# Patient Record
Sex: Female | Born: 1965
Health system: Southern US, Community
[De-identification: ages and names within clinical notes are randomized; demographics above are authoritative.]

## PROBLEM LIST (undated history)

## (undated) DIAGNOSIS — F1910 Other psychoactive substance abuse, uncomplicated: Secondary | ICD-10-CM

## (undated) DIAGNOSIS — M79671 Pain in right foot: Secondary | ICD-10-CM

## (undated) DIAGNOSIS — F1010 Alcohol abuse, uncomplicated: Secondary | ICD-10-CM

## (undated) DIAGNOSIS — E119 Type 2 diabetes mellitus without complications: Secondary | ICD-10-CM

## (undated) DIAGNOSIS — M79672 Pain in left foot: Secondary | ICD-10-CM

## (undated) DIAGNOSIS — N611 Abscess of the breast and nipple: Secondary | ICD-10-CM

## (undated) DIAGNOSIS — F329 Major depressive disorder, single episode, unspecified: Secondary | ICD-10-CM

## (undated) DIAGNOSIS — J069 Acute upper respiratory infection, unspecified: Secondary | ICD-10-CM

## (undated) DIAGNOSIS — I10 Essential (primary) hypertension: Secondary | ICD-10-CM

## (undated) DIAGNOSIS — E7800 Pure hypercholesterolemia, unspecified: Secondary | ICD-10-CM

## (undated) DIAGNOSIS — F32A Depression, unspecified: Secondary | ICD-10-CM

## (undated) DIAGNOSIS — A549 Gonococcal infection, unspecified: Secondary | ICD-10-CM

## (undated) DIAGNOSIS — D649 Anemia, unspecified: Secondary | ICD-10-CM

## (undated) DIAGNOSIS — A159 Respiratory tuberculosis unspecified: Secondary | ICD-10-CM

## (undated) DIAGNOSIS — R569 Unspecified convulsions: Secondary | ICD-10-CM

## (undated) DIAGNOSIS — F419 Anxiety disorder, unspecified: Secondary | ICD-10-CM

## (undated) DIAGNOSIS — A749 Chlamydial infection, unspecified: Secondary | ICD-10-CM

## (undated) HISTORY — DX: Abscess of the breast and nipple: N61.1

## (undated) HISTORY — DX: Alcohol abuse, uncomplicated: F10.10

## (undated) HISTORY — DX: Gonococcal infection, unspecified: A54.9

## (undated) HISTORY — DX: Other psychoactive substance abuse, uncomplicated: F19.10

## (undated) HISTORY — PX: BREAST LUMPECTOMY: SHX2

## (undated) HISTORY — DX: Respiratory tuberculosis unspecified: A15.9

## (undated) HISTORY — DX: Chlamydial infection, unspecified: A74.9

---

## 2000-07-25 ENCOUNTER — Inpatient Hospital Stay (HOSPITAL_COMMUNITY): Admission: AC | Admit: 2000-07-25 | Discharge: 2000-07-27 | Payer: Self-pay

## 2000-07-25 ENCOUNTER — Encounter: Payer: Self-pay | Admitting: Orthopedic Surgery

## 2000-07-25 ENCOUNTER — Encounter: Payer: Self-pay | Admitting: *Deleted

## 2000-12-03 ENCOUNTER — Inpatient Hospital Stay (HOSPITAL_COMMUNITY): Admission: AD | Admit: 2000-12-03 | Discharge: 2000-12-03 | Payer: Self-pay | Admitting: *Deleted

## 2002-05-20 ENCOUNTER — Inpatient Hospital Stay (HOSPITAL_COMMUNITY): Admission: AD | Admit: 2002-05-20 | Discharge: 2002-05-22 | Payer: Self-pay | Admitting: Family Medicine

## 2003-04-23 ENCOUNTER — Emergency Department (HOSPITAL_COMMUNITY): Admission: EM | Admit: 2003-04-23 | Discharge: 2003-04-23 | Payer: Self-pay | Admitting: Emergency Medicine

## 2003-09-21 ENCOUNTER — Ambulatory Visit (HOSPITAL_COMMUNITY): Admission: RE | Admit: 2003-09-21 | Discharge: 2003-09-21 | Payer: Self-pay | Admitting: Obstetrics and Gynecology

## 2004-02-15 ENCOUNTER — Inpatient Hospital Stay (HOSPITAL_COMMUNITY): Admission: AD | Admit: 2004-02-15 | Discharge: 2004-02-17 | Payer: Self-pay | Admitting: *Deleted

## 2004-02-15 ENCOUNTER — Ambulatory Visit: Payer: Self-pay | Admitting: Obstetrics and Gynecology

## 2004-02-20 ENCOUNTER — Inpatient Hospital Stay (HOSPITAL_COMMUNITY): Admission: AD | Admit: 2004-02-20 | Discharge: 2004-02-20 | Payer: Self-pay | Admitting: Obstetrics & Gynecology

## 2004-02-24 ENCOUNTER — Ambulatory Visit: Payer: Self-pay | Admitting: *Deleted

## 2004-02-29 ENCOUNTER — Inpatient Hospital Stay (HOSPITAL_COMMUNITY): Admission: AD | Admit: 2004-02-29 | Discharge: 2004-03-02 | Payer: Self-pay | Admitting: Obstetrics & Gynecology

## 2005-07-30 ENCOUNTER — Emergency Department (HOSPITAL_COMMUNITY): Admission: EM | Admit: 2005-07-30 | Discharge: 2005-07-30 | Payer: Self-pay | Admitting: Emergency Medicine

## 2005-09-13 ENCOUNTER — Ambulatory Visit: Payer: Self-pay | Admitting: Psychiatry

## 2005-09-14 ENCOUNTER — Inpatient Hospital Stay (HOSPITAL_COMMUNITY): Admission: RE | Admit: 2005-09-14 | Discharge: 2005-09-23 | Payer: Self-pay | Admitting: Psychiatry

## 2006-01-21 HISTORY — PX: TONGUE SURGERY: SHX810

## 2008-08-31 ENCOUNTER — Emergency Department (HOSPITAL_COMMUNITY): Admission: EM | Admit: 2008-08-31 | Discharge: 2008-08-31 | Payer: Self-pay | Admitting: Emergency Medicine

## 2008-08-31 ENCOUNTER — Inpatient Hospital Stay (HOSPITAL_COMMUNITY): Admission: RE | Admit: 2008-08-31 | Discharge: 2008-09-06 | Payer: Self-pay | Admitting: Psychiatry

## 2008-08-31 ENCOUNTER — Ambulatory Visit: Payer: Self-pay | Admitting: Psychiatry

## 2009-03-20 ENCOUNTER — Ambulatory Visit: Payer: Self-pay | Admitting: Radiology

## 2009-03-20 ENCOUNTER — Emergency Department (HOSPITAL_BASED_OUTPATIENT_CLINIC_OR_DEPARTMENT_OTHER): Admission: EM | Admit: 2009-03-20 | Discharge: 2009-03-20 | Payer: Self-pay | Admitting: Emergency Medicine

## 2010-04-28 LAB — BASIC METABOLIC PANEL
Calcium: 8.6 mg/dL (ref 8.4–10.5)
GFR calc Af Amer: >60 mL/min (ref >60)
GFR calc non Af Amer: >60 mL/min (ref >60)
Potassium: 4.2 mEq/L (ref 3.5–5.1)
Sodium: 134 mEq/L — ABNORMAL LOW (ref 135–145)

## 2010-04-28 LAB — DIFFERENTIAL
Basophils Absolute: 0 10*3/uL (ref 0.0–0.1)
Basophils Relative: 1 % (ref 0–1)
Eosinophils Absolute: 0.1 10*3/uL (ref 0.0–0.7)
Lymphocytes Relative: 32 % (ref 12–46)
Monocytes Absolute: 0.3 10*3/uL (ref 0.1–1.0)
Monocytes Relative: 4 % (ref 3–12)

## 2010-04-28 LAB — CBC
MCHC: 33.8 g/dL (ref 30.0–36.0)
MCV: 84.6 fL (ref 78.0–100.0)
Platelets: 391 10*3/uL (ref 150–400)
WBC: 7.9 10*3/uL (ref 4.0–10.5)

## 2010-04-28 LAB — RAPID URINE DRUG SCREEN, HOSP PERFORMED
Amphetamines: NOT DETECTED
Benzodiazepines: NOT DETECTED
Opiates: NOT DETECTED

## 2010-04-28 LAB — ETHANOL: Alcohol, Ethyl (B): 152 mg/dL — ABNORMAL HIGH (ref 0–10)

## 2010-04-29 LAB — HEPATIC FUNCTION PANEL
ALT: 20 U/L (ref 0–35)
AST: 25 U/L (ref 0–37)
Albumin: 3.7 g/dL (ref 3.5–5.2)
Total Bilirubin: 0.5 mg/dL (ref 0.3–1.2)

## 2010-06-05 NOTE — H&P (Signed)
[name], [name]              ACCOUNT NO.:  [id number]   MEDICAL RECORD NO.:  [id number]          PATIENT TYPE:  IPS   LOCATION:  0504                          FACILITY:  [facility]   PHYSICIAN:  Geoffery Lyons, M.D.      DATE OF BIRTH:  Nov 28, 1965   DATE OF ADMISSION:  08/31/2008  DATE OF DISCHARGE:                       PSYCHIATRIC ADMISSION ASSESSMENT   This is a 45 year old female voluntarily admitted on August 31, 2008.   HISTORY OF PRESENT ILLNESS:  The patient relapsed on alcohol after being  sober for 1 year.  Attributes her relapse to moving back to the  [place] area to have a relationship which she states did not work out  well.  She reports while she was sober she was going to [employer] and  was in counseling and was going to school.  She has been feeling some  depressive symptoms, but denies any suicidal thoughts.  Her last drink  was the day of admission.  Denies any seizures.   PAST PSYCHIATRIC HISTORY:  First admission to [facility].  Has been in counseling before for about 8 months.  She also was  receiving vocational skills in [place] and was doing some on-the-  job training but currently has no outpatient mental health treatment.   SOCIAL HISTORY:  The patient was staying in the shelter for women.  She  has two children, ages 77 and 27 who are with the father of these children  in [place], [place].  She feels that she will have difficulty  obtaining custody again of these children due to her circumstances with  finances and lack of occupation.   FAMILY HISTORY:  None.   ALCOHOL AND DRUG HISTORY:  The patient has been drinking alcohol.  Denies any other substance use.   PRIMARY CARE Diannah Rindfleisch:  None.   MEDICAL PROBLEMS:  Denies any acute or chronic health issues.   MEDICATIONS LISTED:  Seroquel and Paxil, last taken approximately 2  months ago.   DRUG ALLERGIES:  No known allergies.   PHYSICAL EXAMINATION:  GENERAL:  This is a  middle-aged female fully  assessed at [facility] emergency department.  She appears in no  distress.  She offers no complaints.  Does appear tired, however.  Her  laboratory data shows an alcohol level of 152.  Urine drug screen is  negative.  Her BMET is within normal limits.  CBC shows a red blood cell  count of 5.26.  MENTAL STATUS EXAM:  The patient is fully alert and cooperative.  Provides a good history to her circumstances that surrounded this  admission.  Her speech is clear.  Normal pace and tone.  She has good  eye contact.  Her mood is depressed.  The patient does appear sad.  Thought processes are coherent, goal directed.  No delusional  statements.  Denies any suicidal thoughts.  Cognitive function intact.  Her memory is intact.  Her insight is good.  Again, she appears sincere.   IMPRESSION:  AXIS I:  Alcohol abuse.  Depressive disorder NOS.  AXIS II:  Deferred.  AXIS III:  No known  medical conditions.  AXIS IV:  Problems with occupation, possible problems with housing,  economic issues and psychosocial problems.  AXIS V:  Current is 45.   PLAN:  Our plan is have Librium available on a p.r.n. basis.  The  patient will be in the red group.  Will continue to work on her relapse  prevention and assess comorbidities, identify her support group.  Case  manager will assess her living arrangements.  Her tentative length of  stay at this time is 3-4 days.      Landry Corporal, N.P.      Geoffery Lyons, M.D.  Electronically Signed    JO/MEDQ  D:  09/01/2008  T:  09/01/2008  Job:  [id number]
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
## 2010-11-06 ENCOUNTER — Emergency Department (HOSPITAL_COMMUNITY): Payer: Self-pay

## 2010-11-06 ENCOUNTER — Emergency Department (HOSPITAL_COMMUNITY)
Admission: EM | Admit: 2010-11-06 | Discharge: 2010-11-06 | Disposition: A | Discharge status: Home / Self Care | Payer: Self-pay | Attending: Emergency Medicine | Admitting: Emergency Medicine

## 2010-11-06 DIAGNOSIS — R6883 Chills (without fever): Secondary | ICD-10-CM | POA: Insufficient documentation

## 2010-11-06 DIAGNOSIS — F17200 Nicotine dependence, unspecified, uncomplicated: Secondary | ICD-10-CM | POA: Insufficient documentation

## 2010-11-06 DIAGNOSIS — F319 Bipolar disorder, unspecified: Secondary | ICD-10-CM | POA: Insufficient documentation

## 2010-11-06 DIAGNOSIS — J069 Acute upper respiratory infection, unspecified: Secondary | ICD-10-CM | POA: Insufficient documentation

## 2010-11-06 DIAGNOSIS — R070 Pain in throat: Secondary | ICD-10-CM | POA: Insufficient documentation

## 2010-11-06 DIAGNOSIS — Z9889 Other specified postprocedural states: Secondary | ICD-10-CM | POA: Insufficient documentation

## 2010-11-06 DIAGNOSIS — K219 Gastro-esophageal reflux disease without esophagitis: Secondary | ICD-10-CM | POA: Insufficient documentation

## 2010-11-06 DIAGNOSIS — R059 Cough, unspecified: Secondary | ICD-10-CM | POA: Insufficient documentation

## 2010-11-06 DIAGNOSIS — IMO0001 Reserved for inherently not codable concepts without codable children: Secondary | ICD-10-CM | POA: Insufficient documentation

## 2010-11-06 DIAGNOSIS — R05 Cough: Secondary | ICD-10-CM | POA: Insufficient documentation

## 2010-11-06 DIAGNOSIS — J3489 Other specified disorders of nose and nasal sinuses: Secondary | ICD-10-CM | POA: Insufficient documentation

## 2010-11-08 ENCOUNTER — Other Ambulatory Visit: Payer: Self-pay | Admitting: Obstetrics and Gynecology

## 2010-11-08 DIAGNOSIS — Z1231 Encounter for screening mammogram for malignant neoplasm of breast: Secondary | ICD-10-CM

## 2010-11-13 ENCOUNTER — Ambulatory Visit
Admission: RE | Admit: 2010-11-13 | Discharge: 2010-11-13 | Disposition: A | Discharge status: Home / Self Care | Payer: No Typology Code available for payment source | Source: Ambulatory Visit | Attending: Obstetrics and Gynecology | Admitting: Obstetrics and Gynecology

## 2010-11-13 ENCOUNTER — Other Ambulatory Visit: Payer: Self-pay | Admitting: Obstetrics and Gynecology

## 2010-11-13 ENCOUNTER — Encounter (HOSPITAL_COMMUNITY): Payer: Self-pay

## 2010-11-13 ENCOUNTER — Ambulatory Visit (INDEPENDENT_AMBULATORY_CARE_PROVIDER_SITE_OTHER): Payer: No Typology Code available for payment source | Admitting: *Deleted

## 2010-11-13 ENCOUNTER — Encounter: Payer: Self-pay | Admitting: *Deleted

## 2010-11-13 ENCOUNTER — Other Ambulatory Visit: Payer: Self-pay | Admitting: Diagnostic Radiology

## 2010-11-13 ENCOUNTER — Ambulatory Visit (HOSPITAL_COMMUNITY): Admission: RE | Admit: 2010-11-13 | Payer: Self-pay | Source: Ambulatory Visit

## 2010-11-13 VITALS — BP 158/102 | HR 96 | Temp 97.8°F | Ht 63.25 in | Wt 199.2 lb

## 2010-11-13 DIAGNOSIS — N63 Unspecified lump in unspecified breast: Secondary | ICD-10-CM

## 2010-11-13 DIAGNOSIS — N6320 Unspecified lump in the left breast, unspecified quadrant: Secondary | ICD-10-CM

## 2010-11-13 DIAGNOSIS — Z01419 Encounter for gynecological examination (general) (routine) without abnormal findings: Secondary | ICD-10-CM

## 2010-11-13 HISTORY — PX: BREAST BIOPSY: SHX20

## 2010-11-13 NOTE — Progress Notes (Signed)
Lump in left breast.  Pap Smear:    Pap smear completed today. Last pap smear was greater than 3 years ago per patient. No history of abnormal pap smears per patient. No pap smear reports in EPIC.  Physical exam: Breasts Breasts symmetrical. No skin abnormality. Bilateral nipple inversion. Per patient nipples have always been inverted. No nipple discharge observed on exam. No lymphadenopathy. No lumps palpated in right breast. Left breast palpated a lump around 8cm in length x 5cm in width. In the lower beginning the center of the areola between 3-7 o'clock. Patient referred to the [facility] for Diagnostic mammogram and ultrasound and she is scheduled to go after exam completed.     Pelvic/Bimanual   Ext Genitalia Small red lesion observed on the right labia minora consistent with an inflamed sebaceous gland confirmed by Dr. Macon Large. No swelling or discharge observed.     Vagina Vagina pink. Vagina normal texture and no lesions observed. Small amount of thick white discharge observed.    Cervix Cervix present and pink. Cervix friable and small amount of bleeding at os.    Uterus Uterus present.       Adnexae Ovaries Present   Rectovaginal      No rectal exam completed related to no rectal complaints. No skin abnormalites observed.

## 2010-11-13 NOTE — Patient Instructions (Signed)
Taught patient how to perform BSE and gave her education materials to take home. Referred patient to the [facility] for Diagnostic mammogram, ultrasound and possible biopsy. Breast Center has scheduled for patient to come after she is seen at [facility] clinic. Gave patient directions. Informed patient that per BCCCP guidelines would not need another pap smear for 3 years unless pap smear abnormal or has had an abnormal pap smear in the past. Told patient would call her with results. Patient verbalized understanding.

## 2010-11-14 ENCOUNTER — Other Ambulatory Visit: Payer: Self-pay | Admitting: Obstetrics and Gynecology

## 2010-11-14 ENCOUNTER — Ambulatory Visit
Admission: RE | Admit: 2010-11-14 | Discharge: 2010-11-14 | Disposition: A | Discharge status: Home / Self Care | Payer: No Typology Code available for payment source | Source: Ambulatory Visit | Attending: Obstetrics and Gynecology | Admitting: Obstetrics and Gynecology

## 2010-11-14 DIAGNOSIS — N63 Unspecified lump in unspecified breast: Secondary | ICD-10-CM

## 2010-11-28 ENCOUNTER — Other Ambulatory Visit: Payer: Self-pay | Admitting: Obstetrics and Gynecology

## 2010-11-28 ENCOUNTER — Ambulatory Visit
Admission: RE | Admit: 2010-11-28 | Discharge: 2010-11-28 | Disposition: A | Discharge status: Home / Self Care | Payer: No Typology Code available for payment source | Source: Ambulatory Visit | Attending: Obstetrics and Gynecology | Admitting: Obstetrics and Gynecology

## 2010-11-28 DIAGNOSIS — N63 Unspecified lump in unspecified breast: Secondary | ICD-10-CM

## 2010-11-30 ENCOUNTER — Telehealth: Payer: Self-pay

## 2010-11-30 NOTE — Telephone Encounter (Signed)
Called pt and left message I'm calling on behalf of the [facility] program if she could give [place] a call at the clinics.  Pt needs to be informed of normal pap results and to make sure she gets a yearly pap with a pelvic exam next year.

## 2010-12-10 ENCOUNTER — Ambulatory Visit
Admission: RE | Admit: 2010-12-10 | Discharge: 2010-12-10 | Disposition: A | Discharge status: Home / Self Care | Payer: No Typology Code available for payment source | Source: Ambulatory Visit | Attending: Obstetrics and Gynecology | Admitting: Obstetrics and Gynecology

## 2010-12-10 DIAGNOSIS — N63 Unspecified lump in unspecified breast: Secondary | ICD-10-CM

## 2010-12-12 ENCOUNTER — Ambulatory Visit (INDEPENDENT_AMBULATORY_CARE_PROVIDER_SITE_OTHER): Payer: No Typology Code available for payment source | Admitting: General Surgery

## 2010-12-12 ENCOUNTER — Encounter (INDEPENDENT_AMBULATORY_CARE_PROVIDER_SITE_OTHER): Payer: Self-pay | Admitting: General Surgery

## 2010-12-12 VITALS — BP 140/100 | HR 72 | Temp 98.2°F | Resp 16 | Ht 64.0 in | Wt 198.8 lb

## 2010-12-12 DIAGNOSIS — N61 Mastitis without abscess: Secondary | ICD-10-CM

## 2010-12-12 DIAGNOSIS — N611 Abscess of the breast and nipple: Secondary | ICD-10-CM

## 2010-12-12 MED ORDER — DOXYCYCLINE HYCLATE 100 MG PO TABS: 100.0000 mg | ORAL_TABLET | Freq: Two times a day (BID) | ORAL | Status: AC

## 2010-12-12 NOTE — Progress Notes (Signed)
Subjective:     Patient ID: [name], female   DOB: 08/29/1965, 45 y.o.   MRN: [id number]  HPI The patient is a 45-year-old female who recently went to the breast center with left breast pain. She had a palpable mass in the lateral aspect of left breast. Ultrasound showed this area to be hypoechoic. Biopsies were negative. This was felt to be an abscess although it was never aspirated or cultured. She denies any fevers or chills. She does have some mild discomfort associated with it.  Review of Systems  Constitutional: Negative.   HENT: Negative.   Eyes: Negative.   Respiratory: Negative.   Cardiovascular: Negative.   Gastrointestinal: Negative.   Genitourinary: Negative.   Musculoskeletal: Negative.   Skin: Negative.   Neurological: Negative.   Hematological: Negative.   Psychiatric/Behavioral: Negative.        Objective:   Physical Exam  Constitutional: She is oriented to person, place, and time. She appears well-developed and well-nourished.  HENT:  Head: Normocephalic and atraumatic.  Eyes: Conjunctivae and EOM are normal. Pupils are equal, round, and reactive to light.  Neck: Normal range of motion. Neck supple.  Cardiovascular: Normal rate, regular rhythm and normal heart sounds.   Pulmonary/Chest: Effort normal and breath sounds normal.       The patient has about a 3 cm palpable mass in the lateral aspect of the left breast. No overlying cellulitis. No drainage from the nipple.  Abdominal: Soft. Bowel sounds are normal.  Musculoskeletal: Normal range of motion.  Neurological: She is alert and oriented to person, place, and time.  Skin: Skin is warm and dry.  Psychiatric: She has a normal mood and affect. Her behavior is normal.       Assessment:     Possible left breast abscess.    Plan:     If she truly has an abscess of the breast and I think it will need to be incised and drained. We would plan to leave it open and pack it with gauze and the gauze would  need to be changed daily. I've discussed this in detail with her including the risks and benefits of surgery as well as some of the technical aspects and she understands and wishes to proceed. We will go ahead and start her on doxycycline. We will go ahead and schedule her for an aspiration under anesthesia and possible incision and drainage.      

## 2010-12-26 ENCOUNTER — Inpatient Hospital Stay (HOSPITAL_COMMUNITY): Admission: RE | Admit: 2010-12-26 | Payer: No Typology Code available for payment source | Source: Ambulatory Visit

## 2010-12-27 ENCOUNTER — Encounter (HOSPITAL_COMMUNITY): Payer: Self-pay

## 2010-12-27 ENCOUNTER — Telehealth (INDEPENDENT_AMBULATORY_CARE_PROVIDER_SITE_OTHER): Payer: Self-pay

## 2010-12-27 ENCOUNTER — Other Ambulatory Visit (HOSPITAL_COMMUNITY): Payer: Self-pay | Admitting: *Deleted

## 2010-12-27 ENCOUNTER — Encounter (HOSPITAL_COMMUNITY)
Admission: RE | Admit: 2010-12-27 | Discharge: 2010-12-27 | Disposition: A | Discharge status: Home / Self Care | Payer: No Typology Code available for payment source | Source: Ambulatory Visit | Attending: General Surgery | Admitting: General Surgery

## 2010-12-27 HISTORY — DX: Depression, unspecified: F32.A

## 2010-12-27 HISTORY — DX: Anemia, unspecified: D64.9

## 2010-12-27 HISTORY — DX: Major depressive disorder, single episode, unspecified: F32.9

## 2010-12-27 HISTORY — DX: Acute upper respiratory infection, unspecified: J06.9

## 2010-12-27 LAB — HCG, SERUM, QUALITATIVE: Preg, Serum: NEGATIVE

## 2010-12-27 LAB — CBC
MCH: 29.6 pg (ref 26.0–34.0)
Platelets: 238 10*3/uL (ref 150–400)
RBC: 5.07 MIL/uL (ref 3.87–5.11)
WBC: 9.6 10*3/uL (ref 4.0–10.5)

## 2010-12-27 LAB — COMPREHENSIVE METABOLIC PANEL
ALT: 110 U/L — ABNORMAL HIGH (ref 0–35)
AST: 96 U/L — ABNORMAL HIGH (ref 0–37)
Albumin: 3.7 g/dL (ref 3.5–5.2)
Alkaline Phosphatase: 143 U/L — ABNORMAL HIGH (ref 39–117)
Glucose, Bld: 322 mg/dL — ABNORMAL HIGH (ref 70–99)
Potassium: 4.5 mEq/L (ref 3.5–5.1)
Sodium: 130 mEq/L — ABNORMAL LOW (ref 135–145)
Total Protein: 7.5 g/dL (ref 6.0–8.3)

## 2010-12-27 LAB — SURGICAL PCR SCREEN: MRSA, PCR: NEGATIVE

## 2010-12-27 NOTE — Pre-Procedure Instructions (Addendum)
[address] [name]  12/27/2010   Your procedure is scheduled on:  01/03/2011 @ 0830  Report to [facility] at 6:30 AM.  Call this number if you have problems the morning of surgery: [phone]   Remember: Discontinue Aspirin, Coumadin, Plavix, Effient and Herbal Medications.   Do not eat food:After Midnight.  Can  have clear liquids: up to 4 Hours before arrival.  Can have Clear liquids include soda, tea, black coffee, apple or grape juice, broth up until 2:30 a.m.  Take these medicines the morning of surgery with A SIP OF WATER: Doxycycline    Do not wear jewelry, make-up or nail polish.  Do not wear lotions, powders, or perfumes. You may wear deodorant.  Do not shave 48 hours prior to surgery.  Do not bring valuables to the hospital.  Contacts, dentures or bridgework may not be worn into surgery.  Leave suitcase in the car. After surgery it may be brought to your room.  For patients admitted to the hospital, checkout time is 11:00 AM the day of discharge.   Patients discharged the day of surgery will not be allowed to drive home.  Name and phone number of your driver: Leonard Hendler fiance.  Special Instructions: CHG Shower Use Special Wash: 1/2 bottle night before surgery and 1/2 bottle morning of surgery.   Please read over the following fact sheets that you were given: Pain Booklet, Coughing and Deep Breathing, MRSA Information and Surgical Site Infection Prevention

## 2010-12-27 NOTE — Telephone Encounter (Signed)
[name] at [employer] called re:abn glucose of 322 on pre op labs. Pt will need clearance [id number] per preop. I have tried to reach pt at phone # in system but had to leave msg. Pt does not have a PCP listed.  Labs printed and will give these labs to Robbin to continue to reach pt for work up prior to surgery. Will route msg to Dr Carolynne Edouard also.

## 2010-12-27 NOTE — Progress Notes (Signed)
Dr. Billey Chang office made aware of pt. Blood glucose 322,no history of diabetes.

## 2010-12-28 ENCOUNTER — Telehealth (INDEPENDENT_AMBULATORY_CARE_PROVIDER_SITE_OTHER): Payer: Self-pay

## 2010-12-28 NOTE — Telephone Encounter (Signed)
Left message for patient to call re: pre op glucose.  RMP

## 2010-12-28 NOTE — Consult Note (Signed)
Anesthesia:  Patient is a 45 year old female for I&D of a left breast abscess.  Her history includes depression, smoking, STDs, substance abuse, and TB s/p treatment.  Her BP was also elevated at her PAT appointment (154/94).  I was asked to review her preoperative labs showing an elevated glucose of 322.  Her AST and ALT were also elevated at 96 and 110, respectively.  This was new since August of 2010.  No PCP is listed.  Her last CXR on 11/06/10 showed no acute process.  She has no EKG in the [facility] system.  Her PAT nurse has already notified [facility] re: need for the patient to be evaluated for her hyperglycemia.  Since [facility] is now closed for the day, I will have to follow-up with them on 12/31/10.  I will also have to review her elevated LFTs with them at that time.

## 2010-12-31 NOTE — Consult Note (Signed)
Anesthesia:  See my consult note from 12/28/10.  I have spoken with Marcelino Duster at [facility].  Glenna Fellows from their office has already attempted to contact the patient, but has not heard back from her yet.  I also was unable to contact the patient at the number she provided.    I did review her labs and history with Dr. Gypsy Balsam.  Since the patient has a suspected breast abscess, it should be drained.  Ideally, her glucose should be controlled, but in the event that she can not be evaluated by a PCP preoperatively, her hyperglycemia will be treated on arrival as appropriate and peri-operatively.  Ideally, she should also be worked-up for her elevated AST/ALT pre-operatively, however, due to the nature of the procedure, Anesthesia feels this can be evaluated post-operatively.  Dr. Carolynne Edouard had started her on Doxycyline which could be contributing to her elevated transaminases, but without a formal evaluation, infectious or other etiology cannot be ruled out.  We will check a glucose on arrival, but otherwise Anesthesia will defer any additional preoperative lab orders to Dr. Carolynne Edouard.  We will order an EKG for the morning of surgery based on her history of probably untreated DM2 and HTN.

## 2011-01-02 MED ORDER — VANCOMYCIN HCL IN DEXTROSE 1-5 GM/200ML-% IV SOLN
1000.0000 mg | INTRAVENOUS | Status: AC
  Administered 2011-01-03: 1000 mg via INTRAVENOUS
  Filled 2011-01-02: qty 200

## 2011-01-02 NOTE — Telephone Encounter (Signed)
I HAVE LEFT A MESSAGE ON AVAILABLE PHONE # GIVEN X 2 DAYS. DR. Carolynne Edouard INDICATED HE WOULD PROCEED WITH SURGERY. ALLISON AT PRE-ADMITTING NOTIFIED./ [phone]/GY

## 2011-01-03 ENCOUNTER — Encounter (HOSPITAL_COMMUNITY): Payer: Self-pay | Admitting: Vascular Surgery

## 2011-01-03 ENCOUNTER — Other Ambulatory Visit (INDEPENDENT_AMBULATORY_CARE_PROVIDER_SITE_OTHER): Payer: Self-pay | Admitting: General Surgery

## 2011-01-03 ENCOUNTER — Ambulatory Visit (HOSPITAL_COMMUNITY): Payer: No Typology Code available for payment source | Admitting: Vascular Surgery

## 2011-01-03 ENCOUNTER — Other Ambulatory Visit: Payer: Self-pay

## 2011-01-03 ENCOUNTER — Ambulatory Visit (HOSPITAL_COMMUNITY)
Admission: RE | Admit: 2011-01-03 | Discharge: 2011-01-03 | Disposition: A | Discharge status: Home / Self Care | Payer: No Typology Code available for payment source | Source: Ambulatory Visit | Attending: General Surgery | Admitting: General Surgery

## 2011-01-03 ENCOUNTER — Encounter (HOSPITAL_COMMUNITY)
Admission: RE | Disposition: A | Discharge status: Home / Self Care | Payer: Self-pay | Source: Ambulatory Visit | Attending: General Surgery

## 2011-01-03 DIAGNOSIS — F329 Major depressive disorder, single episode, unspecified: Secondary | ICD-10-CM | POA: Insufficient documentation

## 2011-01-03 DIAGNOSIS — N611 Abscess of the breast and nipple: Secondary | ICD-10-CM

## 2011-01-03 DIAGNOSIS — N61 Mastitis without abscess: Secondary | ICD-10-CM

## 2011-01-03 DIAGNOSIS — F17200 Nicotine dependence, unspecified, uncomplicated: Secondary | ICD-10-CM | POA: Insufficient documentation

## 2011-01-03 DIAGNOSIS — F3289 Other specified depressive episodes: Secondary | ICD-10-CM | POA: Insufficient documentation

## 2011-01-03 HISTORY — PX: INCISE AND DRAIN ABCESS: PRO64

## 2011-01-03 LAB — GLUCOSE, CAPILLARY: Glucose-Capillary: 293 mg/dL — ABNORMAL HIGH (ref 70–99)

## 2011-01-03 SURGERY — INCISION AND DRAINAGE, ABSCESS
Anesthesia: General | Site: Breast | Laterality: Left | Wound class: Dirty or Infected

## 2011-01-03 MED ORDER — FENTANYL CITRATE 0.05 MG/ML IJ SOLN
INTRAMUSCULAR | Status: DC | PRN
  Administered 2011-01-03: 50 ug via INTRAVENOUS

## 2011-01-03 MED ORDER — PROPOFOL 10 MG/ML IV EMUL
INTRAVENOUS | Status: DC | PRN
  Administered 2011-01-03: 200 mg via INTRAVENOUS

## 2011-01-03 MED ORDER — HYDROMORPHONE HCL PF 1 MG/ML IJ SOLN
0.2500 mg | INTRAMUSCULAR | Status: DC | PRN
  Administered 2011-01-03: 0.5 mg via INTRAVENOUS

## 2011-01-03 MED ORDER — ONDANSETRON HCL 4 MG/2ML IJ SOLN
INTRAMUSCULAR | Status: DC | PRN
  Administered 2011-01-03: 4 mg via INTRAVENOUS

## 2011-01-03 MED ORDER — SODIUM CHLORIDE 0.9 % IR SOLN
Status: DC | PRN
  Administered 2011-01-03: 1

## 2011-01-03 MED ORDER — HYDROCODONE-ACETAMINOPHEN 5-325 MG PO TABS: 1.0000 | ORAL_TABLET | ORAL | Status: AC | PRN

## 2011-01-03 MED ORDER — LIDOCAINE HCL (CARDIAC) 20 MG/ML IV SOLN
INTRAVENOUS | Status: DC | PRN
  Administered 2011-01-03: 80 mg via INTRAVENOUS

## 2011-01-03 MED ORDER — LACTATED RINGERS IV SOLN
INTRAVENOUS | Status: DC | PRN
  Administered 2011-01-03: 09:00:00 via INTRAVENOUS

## 2011-01-03 MED ORDER — MIDAZOLAM HCL 5 MG/5ML IJ SOLN
INTRAMUSCULAR | Status: DC | PRN
Start: 2011-01-03 — End: 2011-01-03
  Administered 2011-01-03 (×2): 1 mg via INTRAVENOUS

## 2011-01-03 MED ORDER — ONDANSETRON HCL 4 MG/2ML IJ SOLN: 4.0000 mg | Freq: Four times a day (QID) | INTRAMUSCULAR | Status: DC | PRN

## 2011-01-03 MED ORDER — BUPIVACAINE-EPINEPHRINE 0.25% -1:200000 IJ SOLN
INTRAMUSCULAR | Status: DC | PRN
  Administered 2011-01-03: 30 mL

## 2011-01-03 SURGICAL SUPPLY — 41 items
BANDAGE GAUZE ELAST BULKY 4 IN (GAUZE/BANDAGES/DRESSINGS) IMPLANT
BLADE SURG 10 STRL SS (BLADE) ×2 IMPLANT
BLADE SURG 15 STRL LF DISP TIS (BLADE) ×1 IMPLANT
BLADE SURG 15 STRL SS (BLADE) ×2
CANISTER SUCTION 2500CC (MISCELLANEOUS) ×2 IMPLANT
CLOTH BEACON ORANGE TIMEOUT ST (SAFETY) ×2 IMPLANT
COVER SURGICAL LIGHT HANDLE (MISCELLANEOUS) ×2 IMPLANT
DECANTER SPIKE VIAL GLASS SM (MISCELLANEOUS) IMPLANT
DRAPE LAPAROSCOPIC ABDOMINAL (DRAPES) ×2 IMPLANT
DRAPE UTILITY 15X26 W/TAPE STR (DRAPE) ×2 IMPLANT
DRSG PAD ABDOMINAL 8X10 ST (GAUZE/BANDAGES/DRESSINGS) IMPLANT
ELECT CAUTERY BLADE 6.4 (BLADE) ×2 IMPLANT
ELECT REM PT RETURN 9FT ADLT (ELECTROSURGICAL) ×2
ELECTRODE REM PT RTRN 9FT ADLT (ELECTROSURGICAL) ×1 IMPLANT
GLOVE BIO SURGEON STRL SZ7.5 (GLOVE) ×4 IMPLANT
GOWN STRL NON-REIN LRG LVL3 (GOWN DISPOSABLE) ×6 IMPLANT
KIT BASIN OR (CUSTOM PROCEDURE TRAY) ×2 IMPLANT
KIT ROOM TURNOVER OR (KITS) ×2 IMPLANT
NDL HYPO 25X1 1.5 SAFETY (NEEDLE) IMPLANT
NEEDLE HYPO 25X1 1.5 SAFETY (NEEDLE) ×2 IMPLANT
NS IRRIG 1000ML POUR BTL (IV SOLUTION) ×2 IMPLANT
PACK SURGICAL SETUP 50X90 (CUSTOM PROCEDURE TRAY) ×2 IMPLANT
PAD ARMBOARD 7.5X6 YLW CONV (MISCELLANEOUS) ×4 IMPLANT
PENCIL BUTTON HOLSTER BLD 10FT (ELECTRODE) ×2 IMPLANT
SPECIMEN JAR SMALL (MISCELLANEOUS) IMPLANT
SPONGE GAUZE 2X2 12PLY UNSTER (GAUZE/BANDAGES/DRESSINGS) ×2 IMPLANT
SPONGE GAUZE 4X4 12PLY (GAUZE/BANDAGES/DRESSINGS) IMPLANT
SPONGE LAP 18X18 X RAY DECT (DISPOSABLE) ×2 IMPLANT
SUT MNCRL AB 4-0 PS2 18 (SUTURE) ×2 IMPLANT
SUT VIC AB 3-0 SH 27 (SUTURE) ×2
SUT VIC AB 3-0 SH 27XBRD (SUTURE) ×1 IMPLANT
SWAB COLLECTION DEVICE MRSA (MISCELLANEOUS) ×2 IMPLANT
SYR BULB 3OZ (MISCELLANEOUS) ×2 IMPLANT
SYR CONTROL 10ML LL (SYRINGE) ×2 IMPLANT
TAPE CLOTH SURG 6X10 WHT LF (GAUZE/BANDAGES/DRESSINGS) ×2 IMPLANT
TOWEL OR 17X24 6PK STRL BLUE (TOWEL DISPOSABLE) ×2 IMPLANT
TOWEL OR 17X26 10 PK STRL BLUE (TOWEL DISPOSABLE) ×2 IMPLANT
TUBE ANAEROBIC SPECIMEN COL (MISCELLANEOUS) ×2 IMPLANT
TUBE CONNECTING 12X1/4 (SUCTIONS) ×2 IMPLANT
WATER STERILE IRR 1000ML POUR (IV SOLUTION) IMPLANT
YANKAUER SUCT BULB TIP NO VENT (SUCTIONS) ×2 IMPLANT

## 2011-01-03 NOTE — Anesthesia Postprocedure Evaluation (Signed)
Anesthesia Post Note  Patient: [name]  Procedure(s) Performed:  INCISION AND DRAINAGE ABSCESS - incision and drainage of left breast  Anesthesia type: General  Patient location: PACU  Post pain: Pain level controlled and Adequate analgesia  Post assessment: Post-op Vital signs reviewed, Patient's Cardiovascular Status Stable, Respiratory Function Stable, Patent Airway and Pain level controlled  Last Vitals:  Filed Vitals:   01/03/11 1039  BP:   Pulse:   Temp: 36.9 C  Resp:     Post vital signs: Reviewed and stable  Level of consciousness: awake, alert  and oriented  Complications: No apparent anesthesia complications

## 2011-01-03 NOTE — Interval H&P Note (Signed)
History and Physical Interval Note:  01/03/2011 8:32 AM  [name]  has presented today for surgery, with the diagnosis of left breast abscess   The various methods of treatment have been discussed with the patient and family. After consideration of risks, benefits and other options for treatment, the patient has consented to  Procedure(s): INCISION AND DRAINAGE ABSCESS as a surgical intervention .  The patients' history has been reviewed, patient examined, no change in status, stable for surgery.  I have reviewed the patients' chart and labs.  Questions were answered to the patient's satisfaction.     TOTH III,Jade Burkard S

## 2011-01-03 NOTE — Progress Notes (Signed)
Dr. Chaney Malling made aware of CBG of 293. Pt. not aware abt. elevated  Blood sugar.  Denies Diabetes. Pt. Education given. Needs to see primary MD.

## 2011-01-03 NOTE — Op Note (Signed)
01/03/2011  9:39 AM  PATIENT:  [name]  45 y.o. female  PRE-OPERATIVE DIAGNOSIS:  left breast abscess   POST-OPERATIVE DIAGNOSIS:  * No post-op diagnosis entered *  PROCEDURE:  Procedure(s): INCISION AND DRAINAGE ABSCESS  SURGEON:  Surgeon(s): Caleen Essex III, MD  PHYSICIAN ASSISTANT:   ASSISTANTS: none   ANESTHESIA:   general  EBL:  Total I/O In: 400 [I.V.:400] Out: -   BLOOD ADMINISTERED:none  DRAINS: none   LOCAL MEDICATIONS USED:  NONE  SPECIMEN:  Biopsy / Limited Resection  DISPOSITION OF SPECIMEN:  PATHOLOGY  COUNTS:  YES  TOURNIQUET:  * No tourniquets in log *  DICTATION: .Dragon Dictation after informed consent was obtained the patient was brought to the operating room and placed in the supine position on the operative table. After adequate induction of general anesthesia the patient's left breast was prepped with ChloraPrep, allowed to dry, and draped in usual sterile fashion. The patient had a palpable mass in the 5:00 position of the left breast just at the edge of the areola. An attempt was made to pass an 18-gauge needle into this and aspirated but we were unable to aspirate any fluid. A very small radially oriented incision was made overlying the palpable mass with a 15 blade knife. The dissection was carried into the breast tissue by a combination of sharp Bovie dissection and blunt dissection with a hemostat. We were able to identify and enter the cavity. We never really aspirated any pus. Some of the firm cavity wall was excised sharply with the electrocautery and sent to pathology. Cultures were obtained. The wound was examined and found to be hemostatic. The wound was packed with a 4 x 4 gauze and sterile dressings were applied. The patient tolerated the procedure well. At the end of the case all needle sponge and instrument counts are correct. The patient was then awakened and taken to recovery in stable condition.  PLAN OF CARE: Discharge to  home after PACU  PATIENT DISPOSITION:  PACU - hemodynamically stable.   Delay start of Pharmacological VTE agent (>24hrs) due to surgical blood loss or risk of bleeding:  {YES/NO/NOT APPLICABLE:20182

## 2011-01-03 NOTE — Preoperative (Signed)
Beta Blockers   Reason not to administer Beta Blockers:Not Applicable 

## 2011-01-03 NOTE — Transfer of Care (Signed)
Immediate Anesthesia Transfer of Care Note  Patient: [name]  Procedure(s) Performed:  INCISION AND DRAINAGE ABSCESS - incision and drainage of left breast  Patient Location: PACU  Anesthesia Type: General  Level of Consciousness: awake, sedated and patient cooperative  Airway & Oxygen Therapy: Patient Spontanous Breathing and Patient connected to nasal cannula oxygen  Post-op Assessment: Report given to PACU RN, Post -op Vital signs reviewed and stable and Patient moving all extremities X 4  Post vital signs: Reviewed and stable  Complications: No apparent anesthesia complications

## 2011-01-03 NOTE — Anesthesia Procedure Notes (Signed)
Procedure Name: LMA Insertion Date/Time: 01/03/2011 9:13 AM Performed by: Malachi Pro Pre-anesthesia Checklist: Patient identified Patient Re-evaluated:Patient Re-evaluated prior to inductionOxygen Delivery Method: Circle System Utilized Intubation Type: Combination inhalational/ intravenous induction Ventilation: Mask ventilation without difficulty LMA: LMA inserted LMA Size: 4.0 Tube secured with: Tape Dental Injury: Teeth and Oropharynx as per pre-operative assessment

## 2011-01-03 NOTE — H&P (View-Only) (Signed)
Subjective:     Patient ID: [name], female   DOB: 04/03/1965, 45 y.o.   MRN: [id number]  HPI The patient is a 45 year old female who recently went to the breast center with left breast pain. She had a palpable mass in the lateral aspect of left breast. Ultrasound showed this area to be hypoechoic. Biopsies were negative. This was felt to be an abscess although it was never aspirated or cultured. She denies any fevers or chills. She does have some mild discomfort associated with it.  Review of Systems  Constitutional: Negative.   HENT: Negative.   Eyes: Negative.   Respiratory: Negative.   Cardiovascular: Negative.   Gastrointestinal: Negative.   Genitourinary: Negative.   Musculoskeletal: Negative.   Skin: Negative.   Neurological: Negative.   Hematological: Negative.   Psychiatric/Behavioral: Negative.        Objective:   Physical Exam  Constitutional: She is oriented to person, place, and time. She appears well-developed and well-nourished.  HENT:  Head: Normocephalic and atraumatic.  Eyes: Conjunctivae and EOM are normal. Pupils are equal, round, and reactive to light.  Neck: Normal range of motion. Neck supple.  Cardiovascular: Normal rate, regular rhythm and normal heart sounds.   Pulmonary/Chest: Effort normal and breath sounds normal.       The patient has about a 3 cm palpable mass in the lateral aspect of the left breast. No overlying cellulitis. No drainage from the nipple.  Abdominal: Soft. Bowel sounds are normal.  Musculoskeletal: Normal range of motion.  Neurological: She is alert and oriented to person, place, and time.  Skin: Skin is warm and dry.  Psychiatric: She has a normal mood and affect. Her behavior is normal.       Assessment:     Possible left breast abscess.    Plan:     If she truly has an abscess of the breast and I think it will need to be incised and drained. We would plan to leave it open and pack it with gauze and the gauze would  need to be changed daily. I've discussed this in detail with her including the risks and benefits of surgery as well as some of the technical aspects and she understands and wishes to proceed. We will go ahead and start her on doxycycline. We will go ahead and schedule her for an aspiration under anesthesia and possible incision and drainage.

## 2011-01-03 NOTE — Anesthesia Preprocedure Evaluation (Signed)
Anesthesia Evaluation  Patient identified by MRN, date of birth, ID band Patient awake    Reviewed: Allergy & Precautions, H&P , NPO status , Patient's Chart, lab work & pertinent test results  Airway Mallampati: II  Neck ROM: full    Dental   Pulmonary Recent URI , Current Smoker,          Cardiovascular     Neuro/Psych PSYCHIATRIC DISORDERS Depression    GI/Hepatic   Endo/Other    Renal/GU      Musculoskeletal   Abdominal   Peds  Hematology   Anesthesia Other Findings   Reproductive/Obstetrics                           Anesthesia Physical Anesthesia Plan  ASA: II  Anesthesia Plan: General   Post-op Pain Management:    Induction: Intravenous  Airway Management Planned: LMA  Additional Equipment:   Intra-op Plan:   Post-operative Plan:   Informed Consent: I have reviewed the patients History and Physical, chart, labs and discussed the procedure including the risks, benefits and alternatives for the proposed anesthesia with the patient or authorized representative who has indicated his/her understanding and acceptance.     Plan Discussed with: CRNA and Surgeon  Anesthesia Plan Comments:         Anesthesia Quick Evaluation

## 2011-01-03 NOTE — Progress Notes (Signed)
accucheck 273

## 2011-01-04 ENCOUNTER — Other Ambulatory Visit (INDEPENDENT_AMBULATORY_CARE_PROVIDER_SITE_OTHER): Payer: Self-pay

## 2011-01-04 DIAGNOSIS — Z5189 Encounter for other specified aftercare: Secondary | ICD-10-CM

## 2011-01-04 DIAGNOSIS — Z9889 Other specified postprocedural states: Secondary | ICD-10-CM

## 2011-01-06 LAB — CULTURE, ROUTINE-ABSCESS

## 2011-01-07 ENCOUNTER — Telehealth (INDEPENDENT_AMBULATORY_CARE_PROVIDER_SITE_OTHER): Payer: Self-pay | Admitting: General Surgery

## 2011-01-08 LAB — ANAEROBIC CULTURE

## 2011-01-21 ENCOUNTER — Telehealth: Payer: Self-pay | Admitting: *Deleted

## 2011-01-21 NOTE — Telephone Encounter (Signed)
Attempted to call patient to follow up. Patient needs to schedule a follow up appointment with Dr. Carolynne Edouard post surgery follow up. No one answered phone. Left voicemail for patient to call me back.

## 2011-01-24 NOTE — Telephone Encounter (Signed)
POST-OP VISIT MADE WITH DR. TOTH/GY

## 2011-01-30 ENCOUNTER — Encounter (INDEPENDENT_AMBULATORY_CARE_PROVIDER_SITE_OTHER): Payer: No Typology Code available for payment source | Admitting: General Surgery

## 2011-02-05 ENCOUNTER — Encounter (INDEPENDENT_AMBULATORY_CARE_PROVIDER_SITE_OTHER): Payer: No Typology Code available for payment source | Admitting: General Surgery

## 2011-02-13 ENCOUNTER — Encounter (INDEPENDENT_AMBULATORY_CARE_PROVIDER_SITE_OTHER): Payer: Self-pay | Admitting: General Surgery

## 2011-02-18 ENCOUNTER — Telehealth (INDEPENDENT_AMBULATORY_CARE_PROVIDER_SITE_OTHER): Payer: Self-pay | Admitting: Surgery

## 2011-02-18 NOTE — Telephone Encounter (Signed)
APPT MOVED UP TO FEB 18 WITH DR. TOTH/GY

## 2011-03-11 ENCOUNTER — Encounter (INDEPENDENT_AMBULATORY_CARE_PROVIDER_SITE_OTHER): Payer: No Typology Code available for payment source | Admitting: General Surgery

## 2011-03-26 ENCOUNTER — Encounter (INDEPENDENT_AMBULATORY_CARE_PROVIDER_SITE_OTHER): Payer: No Typology Code available for payment source | Admitting: General Surgery

## 2011-05-09 ENCOUNTER — Other Ambulatory Visit (INDEPENDENT_AMBULATORY_CARE_PROVIDER_SITE_OTHER): Payer: Self-pay | Admitting: General Surgery

## 2011-05-09 ENCOUNTER — Telehealth (INDEPENDENT_AMBULATORY_CARE_PROVIDER_SITE_OTHER): Payer: Self-pay | Admitting: General Surgery

## 2011-05-09 DIAGNOSIS — N644 Mastodynia: Secondary | ICD-10-CM

## 2011-05-09 NOTE — Telephone Encounter (Signed)
Pt calling about new pain that has developed in the Lt breast at the same site she has surgery in December.  Chart review stated that was a benign abscess that was drained.  Scheduled her for appt 06/05/11 at 9:10am, but she is in pain.  Can she be moved up to sooner appt.

## 2011-05-09 NOTE — Telephone Encounter (Signed)
[name] has a appt at the [facility] on 05-10-2011 for [facility] and [place] and pt is aware

## 2011-05-10 ENCOUNTER — Ambulatory Visit
Admission: RE | Admit: 2011-05-10 | Discharge: 2011-05-10 | Disposition: A | Discharge status: Home / Self Care | Payer: No Typology Code available for payment source | Source: Ambulatory Visit | Attending: General Surgery | Admitting: General Surgery

## 2011-05-10 DIAGNOSIS — N644 Mastodynia: Secondary | ICD-10-CM

## 2011-05-13 ENCOUNTER — Ambulatory Visit (INDEPENDENT_AMBULATORY_CARE_PROVIDER_SITE_OTHER): Payer: Self-pay | Admitting: General Surgery

## 2011-05-13 ENCOUNTER — Encounter (INDEPENDENT_AMBULATORY_CARE_PROVIDER_SITE_OTHER): Payer: Self-pay | Admitting: General Surgery

## 2011-05-13 ENCOUNTER — Other Ambulatory Visit (INDEPENDENT_AMBULATORY_CARE_PROVIDER_SITE_OTHER): Payer: Self-pay | Admitting: General Surgery

## 2011-05-13 VITALS — BP 164/98 | HR 76 | Temp 96.2°F | Ht 62.5 in | Wt 190.8 lb

## 2011-05-13 DIAGNOSIS — S21009A Unspecified open wound of unspecified breast, initial encounter: Secondary | ICD-10-CM

## 2011-05-13 NOTE — Progress Notes (Signed)
HPI The patient comes in with an infected wound status post biopsy. She recently been placed on doxycycline.  PE She has a 2-3 mm opening in the left periareolar area. The probe to about 3-4 cm. There is a pocket associated with it. It did not washed out with peroxide however I did take a culture. This is what the patient being on doxycycline.    Studiy review None.  Assessment Infected postoperative wound from biopsy. I told the patient to continue to do wet-to-dry dressings with the Nu Gauze. And saline.  Plan Wet-to-dry dressings with quarter-inch plain gauze soaked in saline. It should be done at least twice a day. Dr. Carolynne Edouard was in the clinic today came on his elbow to the patient. She will followup in his clinic in the future. She is to continue to take her doxycycline until her prescription is filled.

## 2011-05-14 ENCOUNTER — Encounter (INDEPENDENT_AMBULATORY_CARE_PROVIDER_SITE_OTHER): Payer: Self-pay | Admitting: General Surgery

## 2011-05-16 LAB — WOUND CULTURE: Gram Stain: NONE SEEN

## 2011-05-22 ENCOUNTER — Emergency Department (HOSPITAL_COMMUNITY)
Admission: EM | Admit: 2011-05-22 | Discharge: 2011-05-22 | Discharge status: Against Medical Advice | Payer: Self-pay | Attending: Emergency Medicine | Admitting: Emergency Medicine

## 2011-05-22 ENCOUNTER — Encounter (HOSPITAL_COMMUNITY): Payer: Self-pay

## 2011-05-22 DIAGNOSIS — R51 Headache: Secondary | ICD-10-CM | POA: Insufficient documentation

## 2011-05-22 DIAGNOSIS — F17200 Nicotine dependence, unspecified, uncomplicated: Secondary | ICD-10-CM | POA: Insufficient documentation

## 2011-05-22 DIAGNOSIS — Z79899 Other long term (current) drug therapy: Secondary | ICD-10-CM | POA: Insufficient documentation

## 2011-05-22 DIAGNOSIS — R209 Unspecified disturbances of skin sensation: Secondary | ICD-10-CM | POA: Insufficient documentation

## 2011-05-22 DIAGNOSIS — F329 Major depressive disorder, single episode, unspecified: Secondary | ICD-10-CM | POA: Insufficient documentation

## 2011-05-22 DIAGNOSIS — Z7982 Long term (current) use of aspirin: Secondary | ICD-10-CM | POA: Insufficient documentation

## 2011-05-22 DIAGNOSIS — F3289 Other specified depressive episodes: Secondary | ICD-10-CM | POA: Insufficient documentation

## 2011-05-22 NOTE — ED Provider Notes (Signed)
History     CSN: [id number]  Arrival date & time 05/22/11  1536   First MD Initiated Contact with Patient 05/22/11 1657      Chief Complaint  Patient presents with  . Headache    (Consider location/radiation/quality/duration/timing/severity/associated sxs/prior treatment) HPI Pt with lightheadedness this afternoon around 1300 and noticated L arm numbness that lasted approx 1 hour. No visual changes, weakness, N,V, HA, fever, neck pain. Pt is now symptom-free.  Past Medical History  Diagnosis Date  . Tuberculosis     was treated  . Gonorrhea   . Chlamydia   . Breast abscess   . Substance abuse     alcohol  . Recurrent upper respiratory infection (URI)     URI/SOB-treated with antibiotics-cleared up now  . Depression     PTSD also  . Anemia     when in late teens    Past Surgical History  Procedure Date  . Tongue surgery 2008    lump removed  . Incise and drain abcess 01/03/11    breast abscess    Family History  Problem Relation Age of Onset  . Cancer Mother     breast  . Cancer Sister     breast  . Cancer Paternal Aunt     breast    History  Substance Use Topics  . Smoking status: Current Everyday Smoker -- 0.2 packs/day for 30 years    Types: Cigarettes  . Smokeless tobacco: Never Used  . Alcohol Use: 0.0 oz/week    14-16 Cans of beer per week     quit "heavy drinking" 3 yrs ago- just recently started back about 6 months ago does not drink daily no-last time was about 3 days ago-3 cans of beer    OB History    Grav Para Term Preterm Abortions TAB SAB Ect Mult Living   2 2 2       2       Review of Systems  Constitutional: Negative for fever and chills.  HENT: Negative for neck pain and neck stiffness.   Eyes: Negative for photophobia and visual disturbance.  Respiratory: Negative for chest tightness and shortness of breath.   Cardiovascular: Negative for chest pain and leg swelling.  Gastrointestinal: Negative for nausea, vomiting, abdominal  pain and diarrhea.  Musculoskeletal: Negative for myalgias and back pain.  Skin: Negative for rash and wound.  Neurological: Positive for dizziness and light-headedness. Negative for syncope, weakness and headaches.  Psychiatric/Behavioral: Negative for confusion.    Allergies  Review of patient's allergies indicates no known allergies.  Home Medications   Current Outpatient Rx  Name Route Sig Dispense Refill  . ASPIRIN EC 81 MG PO TBEC Oral Take 162 mg by mouth once.    Marland Kitchen DOXYCYCLINE HYCLATE 100 MG PO TABS Oral Take 100 mg by mouth 2 (two) times daily.        BP 172/90  Pulse 70  Temp(Src) 98.2 F (36.8 C) (Oral)  Resp 18  SpO2 98%  LMP 05/03/2011  Physical Exam  Nursing note and vitals reviewed. Constitutional: She is oriented to person, place, and time. She appears well-developed and well-nourished. No distress.  HENT:  Head: Normocephalic and atraumatic.  Mouth/Throat: Oropharynx is clear and moist.  Eyes: EOM are normal. Pupils are equal, round, and reactive to light.  Neck: Normal range of motion. Neck supple.  Cardiovascular: Normal rate and regular rhythm.  Exam reveals no gallop and no friction rub.   No murmur heard. Pulmonary/Chest: Effort  normal and breath sounds normal. No respiratory distress. She has no wheezes. She has no rales.  Abdominal: Soft. Bowel sounds are normal. There is no tenderness. There is no rebound and no guarding.  Musculoskeletal: Normal range of motion. She exhibits no edema and no tenderness.  Neurological: She is alert and oriented to person, place, and time. No cranial nerve deficit.       5/5 motor in ext, sensation intact, CN II-XII, finger-to-nose intact.   Skin: Skin is warm and dry. No rash noted. No erythema.  Psychiatric: She has a normal mood and affect. Her behavior is normal.    ED Course  Procedures (including critical care time)  Labs Reviewed - No data to display No results found.   No diagnosis found.    MDM    Pt states she is feeling much better and has appointment to see PMD. She understands risks of leaving prior to completed workup and accepts responsibility. States she has to catch the bus and pick her children up. She urged to return immediately for worsening symptoms or any concerns. She signed AMA form       Loren Racer, MD 05/22/11 1801

## 2011-05-22 NOTE — ED Notes (Signed)
ABout 14:30 pt developed a mild headache and lt arm is numbness which has resolved.  Pt. Reports, "It feels like it is not there"  No other neuro deficits    Pt. Has a  Lt.. Breast lumpectomy on December 13th and then began having pain in her lt. Breast again about 1 month ago.  Pt. Has scheduled on May 15th to have the infection cleaned from her lt. Breast.

## 2011-05-22 NOTE — ED Notes (Signed)
Pt st's she has to go because her children will be home.  St's all  Symptoms have subsided.   AMA form signed after risks of leaving were explained to her.  Also encouraged pt to return for any problems, pt voices understanding.

## 2011-05-22 NOTE — ED Notes (Signed)
Pt st's she had a headache earlier today and also felt like left arm was tingling. Pt st's she took ASA and now headache and tingling in arm has subsided. St's she was afraid because she has been told that she has HTN but thinks it was related to stress.  Pt alert and oriented x's 3, skin warm and dry, color appropriate.

## 2011-05-23 ENCOUNTER — Ambulatory Visit (INDEPENDENT_AMBULATORY_CARE_PROVIDER_SITE_OTHER): Payer: PRIVATE HEALTH INSURANCE | Admitting: General Surgery

## 2011-05-23 ENCOUNTER — Encounter (INDEPENDENT_AMBULATORY_CARE_PROVIDER_SITE_OTHER): Payer: Self-pay | Admitting: General Surgery

## 2011-05-23 VITALS — BP 140/100 | HR 64 | Temp 97.8°F | Resp 18 | Ht 63.0 in | Wt 191.4 lb

## 2011-05-23 DIAGNOSIS — N61 Mastitis without abscess: Secondary | ICD-10-CM

## 2011-05-23 DIAGNOSIS — N611 Abscess of the breast and nipple: Secondary | ICD-10-CM

## 2011-05-23 NOTE — Progress Notes (Signed)
Subjective:     Patient ID: [name], female   DOB: 1965/05/27, 46 y.o.   MRN: [id number]  HPI Patient is a 46 year old female who recently had an incision and drainage of her left breast by Dr. Lindie Spruce. I have actually incised and drained the same area back in November and apparently she did not come back for any of the postop appointments. She denies any pain or discharge today  Review of Systems     Objective:   Physical Exam On exam her left breast incision has healed over at this point. There is no sign of infection.    Assessment:     Status post incision and drainage of left breast abscess    Plan:     At this point I will plan to keep her on the doxycycline for about another 10 days. We will plan to see her back in about 3 or 4 weeks to check the incision again. If the abscess comes back then she may need to have this incised and drained more adequately and the OR again.

## 2011-06-05 ENCOUNTER — Encounter (INDEPENDENT_AMBULATORY_CARE_PROVIDER_SITE_OTHER): Payer: No Typology Code available for payment source | Admitting: General Surgery

## 2011-06-19 ENCOUNTER — Encounter (INDEPENDENT_AMBULATORY_CARE_PROVIDER_SITE_OTHER): Payer: Self-pay | Admitting: General Surgery

## 2011-06-24 ENCOUNTER — Encounter (INDEPENDENT_AMBULATORY_CARE_PROVIDER_SITE_OTHER): Payer: PRIVATE HEALTH INSURANCE | Admitting: General Surgery

## 2011-08-08 ENCOUNTER — Ambulatory Visit (INDEPENDENT_AMBULATORY_CARE_PROVIDER_SITE_OTHER): Payer: PRIVATE HEALTH INSURANCE | Admitting: General Surgery

## 2011-08-08 ENCOUNTER — Encounter (INDEPENDENT_AMBULATORY_CARE_PROVIDER_SITE_OTHER): Payer: Self-pay | Admitting: General Surgery

## 2011-08-08 VITALS — BP 156/100 | HR 85 | Temp 97.6°F | Ht 63.5 in | Wt 182.2 lb

## 2011-08-08 DIAGNOSIS — N61 Mastitis without abscess: Secondary | ICD-10-CM

## 2011-08-08 DIAGNOSIS — N611 Abscess of the breast and nipple: Secondary | ICD-10-CM

## 2011-08-08 NOTE — Progress Notes (Signed)
Subjective:     Patient ID: [name], female   DOB: 1965-02-24, 46 y.o.   MRN: [id number]  HPI The patient is a 46 year old white female who's had recurrent abscesses in the left breast. It has been several weeks since we saw her last period she returns today for follow up appointment.  She has no complaints today. Her breast feels normal.  Review of Systems     Objective:   Physical Exam On exam the incision on her left breast has healed nicely. There is no sign of infection. There is no induration or redness.    Assessment:     History of left breast abscess.    Plan:     At this point she looks well. She will call associate has any further problems. Otherwise we will see her back on a p.r.n. basis.

## 2011-10-07 ENCOUNTER — Ambulatory Visit (INDEPENDENT_AMBULATORY_CARE_PROVIDER_SITE_OTHER): Payer: PRIVATE HEALTH INSURANCE | Admitting: General Surgery

## 2011-10-07 ENCOUNTER — Encounter (INDEPENDENT_AMBULATORY_CARE_PROVIDER_SITE_OTHER): Payer: Self-pay | Admitting: General Surgery

## 2011-10-07 VITALS — BP 138/90 | HR 84 | Temp 97.3°F | Resp 16 | Ht 63.0 in | Wt 179.8 lb

## 2011-10-07 DIAGNOSIS — N644 Mastodynia: Secondary | ICD-10-CM

## 2011-10-07 DIAGNOSIS — N611 Abscess of the breast and nipple: Secondary | ICD-10-CM

## 2011-10-07 NOTE — Progress Notes (Signed)
Subjective:     Patient ID: [name], female   DOB: 10-01-1965, 46 y.o.   MRN: [id number]  HPI The patient is a 46 year old white female who has a history of left breast abscess which we incised and drained back in December. She is healed nicely. About 2 weeks ago she developed a stinging sensation in the left breast. The pain seems to come and go but does not last long. She denies any fevers. She denies any discharge from her nipple.  Review of Systems  Constitutional: Negative.   HENT: Negative.   Eyes: Negative.   Respiratory: Negative.   Cardiovascular: Negative.   Gastrointestinal: Negative.   Genitourinary: Negative.   Musculoskeletal: Negative.   Skin: Negative.   Neurological: Negative.   Hematological: Negative.   Psychiatric/Behavioral: Negative.        Objective:   Physical Exam  Constitutional: She is oriented to person, place, and time. She appears well-developed and well-nourished.  HENT:  Head: Normocephalic and atraumatic.  Eyes: Conjunctivae normal and EOM are normal. Pupils are equal, round, and reactive to light.  Neck: Normal range of motion. Neck supple.  Cardiovascular: Normal rate, regular rhythm and normal heart sounds.   Pulmonary/Chest: Effort normal and breath sounds normal.       There is no palpable mass in either breast. No palpable axillary supraclavicular or cervical lymphadenopathy.  Abdominal: Soft. Bowel sounds are normal. She exhibits no mass. There is no tenderness.  Musculoskeletal: Normal range of motion.  Neurological: She is alert and oriented to person, place, and time.  Skin: Skin is warm and dry.  Psychiatric: She has a normal mood and affect. Her behavior is normal.       Assessment:     The patient has a history of left breast abscess and has a new stinging pain that comes and goes.    Plan:     We will plan to obtain an ultrasound and mammogram of the left breast. If this is negative then she can return to see [place] on a  when necessary basis

## 2011-10-10 ENCOUNTER — Ambulatory Visit
Admission: RE | Admit: 2011-10-10 | Discharge: 2011-10-10 | Disposition: A | Discharge status: Home / Self Care | Payer: No Typology Code available for payment source | Source: Ambulatory Visit | Attending: General Surgery | Admitting: General Surgery

## 2011-10-10 DIAGNOSIS — N611 Abscess of the breast and nipple: Secondary | ICD-10-CM

## 2011-12-16 ENCOUNTER — Other Ambulatory Visit (INDEPENDENT_AMBULATORY_CARE_PROVIDER_SITE_OTHER): Payer: Self-pay | Admitting: General Surgery

## 2011-12-17 ENCOUNTER — Other Ambulatory Visit: Payer: Self-pay | Admitting: Obstetrics and Gynecology

## 2011-12-17 DIAGNOSIS — Z1231 Encounter for screening mammogram for malignant neoplasm of breast: Secondary | ICD-10-CM

## 2011-12-30 ENCOUNTER — Emergency Department (HOSPITAL_COMMUNITY)
Admission: EM | Admit: 2011-12-30 | Discharge: 2011-12-30 | Disposition: A | Discharge status: Home Health | Payer: Self-pay | Attending: Emergency Medicine | Admitting: Emergency Medicine

## 2011-12-30 ENCOUNTER — Emergency Department (HOSPITAL_COMMUNITY): Payer: Self-pay

## 2011-12-30 ENCOUNTER — Encounter (HOSPITAL_COMMUNITY): Payer: Self-pay | Admitting: Emergency Medicine

## 2011-12-30 ENCOUNTER — Encounter (HOSPITAL_COMMUNITY): Payer: Self-pay | Admitting: *Deleted

## 2011-12-30 DIAGNOSIS — F1010 Alcohol abuse, uncomplicated: Secondary | ICD-10-CM | POA: Insufficient documentation

## 2011-12-30 DIAGNOSIS — Z8659 Personal history of other mental and behavioral disorders: Secondary | ICD-10-CM | POA: Insufficient documentation

## 2011-12-30 DIAGNOSIS — R0989 Other specified symptoms and signs involving the circulatory and respiratory systems: Secondary | ICD-10-CM | POA: Insufficient documentation

## 2011-12-30 DIAGNOSIS — R739 Hyperglycemia, unspecified: Secondary | ICD-10-CM

## 2011-12-30 DIAGNOSIS — Z862 Personal history of diseases of the blood and blood-forming organs and certain disorders involving the immune mechanism: Secondary | ICD-10-CM | POA: Insufficient documentation

## 2011-12-30 DIAGNOSIS — R51 Headache: Secondary | ICD-10-CM | POA: Insufficient documentation

## 2011-12-30 DIAGNOSIS — Z8619 Personal history of other infectious and parasitic diseases: Secondary | ICD-10-CM | POA: Insufficient documentation

## 2011-12-30 DIAGNOSIS — R059 Cough, unspecified: Secondary | ICD-10-CM | POA: Insufficient documentation

## 2011-12-30 DIAGNOSIS — F17200 Nicotine dependence, unspecified, uncomplicated: Secondary | ICD-10-CM | POA: Insufficient documentation

## 2011-12-30 DIAGNOSIS — R0609 Other forms of dyspnea: Secondary | ICD-10-CM | POA: Insufficient documentation

## 2011-12-30 DIAGNOSIS — R7309 Other abnormal glucose: Secondary | ICD-10-CM | POA: Insufficient documentation

## 2011-12-30 DIAGNOSIS — Z7982 Long term (current) use of aspirin: Secondary | ICD-10-CM | POA: Insufficient documentation

## 2011-12-30 DIAGNOSIS — R05 Cough: Secondary | ICD-10-CM | POA: Insufficient documentation

## 2011-12-30 DIAGNOSIS — I10 Essential (primary) hypertension: Secondary | ICD-10-CM | POA: Insufficient documentation

## 2011-12-30 DIAGNOSIS — Z8709 Personal history of other diseases of the respiratory system: Secondary | ICD-10-CM | POA: Insufficient documentation

## 2011-12-30 DIAGNOSIS — Z79899 Other long term (current) drug therapy: Secondary | ICD-10-CM | POA: Insufficient documentation

## 2011-12-30 LAB — CBC WITH DIFFERENTIAL/PLATELET
Basophils Relative: 0 % (ref 0–1)
Eosinophils Absolute: 0.3 10*3/uL (ref 0.0–0.7)
HCT: 43 % (ref 36.0–46.0)
Hemoglobin: 14.4 g/dL (ref 12.0–15.0)
MCH: 29.3 pg (ref 26.0–34.0)
MCHC: 33.5 g/dL (ref 30.0–36.0)
MCV: 87.4 fL (ref 78.0–100.0)
Monocytes Absolute: 0.4 10*3/uL (ref 0.1–1.0)
Monocytes Relative: 6 % (ref 3–12)
Neutro Abs: 4.3 10*3/uL (ref 1.7–7.7)

## 2011-12-30 LAB — BASIC METABOLIC PANEL
BUN: 10 mg/dL (ref 6–23)
Chloride: 93 mEq/L — ABNORMAL LOW (ref 96–112)
Creatinine, Ser: 0.46 mg/dL — ABNORMAL LOW (ref 0.50–1.10)
GFR calc Af Amer: >90 mL/min (ref >90)

## 2011-12-30 MED ORDER — LISINOPRIL-HYDROCHLOROTHIAZIDE 10-12.5 MG PO TABS: 1.0000 | ORAL_TABLET | Freq: Every day | ORAL | Status: DC

## 2011-12-30 MED ORDER — METFORMIN HCL 500 MG PO TABS: 500.0000 mg | ORAL_TABLET | Freq: Two times a day (BID) | ORAL | Status: DC

## 2011-12-30 NOTE — ED Provider Notes (Signed)
History     CSN: [id number]  Arrival date & time 12/30/11  7829   First MD Initiated Contact with Patient 12/30/11 [phone]      Chief Complaint  Patient presents with  . Hypertension  . Headache    (Consider location/radiation/quality/duration/timing/severity/associated sxs/prior treatment) Patient is a 46 y.o. female presenting with hypertension and headaches. The history is provided by the patient.  Hypertension This is a new problem. Associated symptoms include headaches. Pertinent negatives include no chest pain, no abdominal pain and no shortness of breath.  Headache  Pertinent negatives include no shortness of breath, no nausea and no vomiting.   patient states she's had a headache and felt a little cloudy since this morning. She states that she went to 2 different pharmacies encounter blood pressure be elevated. She states it was 214 systolic. She states she's been told previously that she has high blood pressure but states that she has kids and is unable to get it looked at. No chest pain. She has had a little difficulty breathing and a cough. She is a smoker but has not smoked for last couple days. No localizing numbness or weakness. The headache is described as throbbing. No swelling in her legs.  Past Medical History  Diagnosis Date  . Tuberculosis     was treated  . Gonorrhea   . Chlamydia   . Breast abscess   . Substance abuse     alcohol  . Recurrent upper respiratory infection (URI)     URI/SOB-treated with antibiotics-cleared up now  . Depression     PTSD also  . Anemia     when in late teens    Past Surgical History  Procedure Date  . Tongue surgery 2008    lump removed  . Incise and drain abcess 01/03/11    breast abscess    Family History  Problem Relation Age of Onset  . Cancer Mother     breast  . Cancer Sister     breast  . Cancer Paternal Aunt     breast    History  Substance Use Topics  . Smoking status: Current Every Day Smoker -- 0.2  packs/day for 30 years    Types: Cigarettes  . Smokeless tobacco: Never Used  . Alcohol Use: 0.0 oz/week    14-16 Cans of beer per week     Comment: quit "heavy drinking" 3 yrs ago- just recently started back about 6 months ago does not drink daily no-last time was about 3 days ago-3 cans of beer    OB History    Grav Para Term Preterm Abortions TAB SAB Ect Mult Living   2 2 2       2       Review of Systems  Constitutional: Negative for activity change and appetite change.  HENT: Negative for neck stiffness.   Eyes: Negative for pain.  Respiratory: Positive for cough. Negative for chest tightness and shortness of breath.   Cardiovascular: Negative for chest pain and leg swelling.  Gastrointestinal: Negative for nausea, vomiting, abdominal pain and diarrhea.  Genitourinary: Negative for flank pain.  Musculoskeletal: Negative for back pain.  Skin: Negative for rash.  Neurological: Positive for headaches. Negative for weakness and numbness.  Psychiatric/Behavioral: Negative for behavioral problems.    Allergies  Review of patient's allergies indicates no known allergies.  Home Medications   Current Outpatient Rx  Name  Route  Sig  Dispense  Refill  . ASPIRIN EC 81 MG PO TBEC  Oral   Take 162 mg by mouth once.         . IBUPROFEN 200 MG PO TABS   Oral   Take 200 mg by mouth every 6 (six) hours as needed. For pain         . LISINOPRIL-HYDROCHLOROTHIAZIDE 10-12.5 MG PO TABS   Oral   Take 1 tablet by mouth daily.   30 tablet   0   . METFORMIN HCL 500 MG PO TABS   Oral   Take 1 tablet (500 mg total) by mouth 2 (two) times daily with a meal.   60 tablet   0     BP 153/97  Pulse 74  Temp 98 F (36.7 C) (Oral)  Resp 20  SpO2 99%  LMP 11/30/2011  Physical Exam  Nursing note and vitals reviewed. Constitutional: She is oriented to person, place, and time. She appears well-developed and well-nourished.  HENT:  Head: Normocephalic and atraumatic.  Eyes: EOM  are normal. Pupils are equal, round, and reactive to light.  Neck: Normal range of motion. Neck supple.  Cardiovascular: Normal rate, regular rhythm and normal heart sounds.   No murmur heard. Pulmonary/Chest: Effort normal. No respiratory distress. She has wheezes. She has no rales.       Mild diffuse wheezes and harsh breath sounds.  Abdominal: Soft. Bowel sounds are normal. She exhibits no distension. There is no tenderness. There is no rebound and no guarding.  Musculoskeletal: Normal range of motion. She exhibits no edema.  Neurological: She is alert and oriented to person, place, and time. No cranial nerve deficit.  Skin: Skin is warm and dry.  Psychiatric: She has a normal mood and affect. Her speech is normal.    ED Course  Procedures (including critical care time)  Labs Reviewed  BASIC METABOLIC PANEL - Abnormal; Notable for the following:    Sodium 131 (*)     Chloride 93 (*)     Glucose, Bld 362 (*)     Creatinine, Ser 0.46 (*)     All other components within normal limits  CBC WITH DIFFERENTIAL  TROPONIN I   Dg Chest 2 View  12/30/2011  *RADIOLOGY REPORT*  Clinical Data: Cough  CHEST - 2 VIEW  Comparison: 11/06/2010  Findings: Lungs are essentially clear.  No focal consolidation. No pleural effusion or pneumothorax.  Cardiomediastinal silhouette is within normal limits.  Mild degenerative changes of the visualized thoracolumbar spine.  IMPRESSION: No evidence of acute cardiopulmonary disease.   Original Report Authenticated By: Charline Bills, M.D.    Ct Head Wo Contrast  12/30/2011  *RADIOLOGY REPORT*  Clinical Data: Headache and hypertension  CT HEAD WITHOUT CONTRAST  Technique:  Contiguous axial images were obtained from the base of the skull through the vertex without contrast.  Comparison: CT 07/30/2005  Findings: Mild hypodensity in the cerebral white matter bilaterally is most compatible with chronic microvascular ischemia.  No acute infarct.  Negative for  hemorrhage or mass.  Ventricle size is normal.  No shift of midline structures. Calvarium is intact.  Stable benign appearing low density lesion in the right frontal   bone.  Mild chronic sinusitis.  IMPRESSION: Mild chronic changes in the white matter.  No acute abnormality.   Original Report Authenticated By: Janeece Riggers, M.D.      1. Hypertension   2. Hyperglycemia     Date: 12/30/2011  Rate: 71  Rhythm: normal sinus rhythm  QRS Axis: normal  Intervals: normal  ST/T Wave abnormalities:  normal  Conduction Disutrbances:none  Narrative Interpretation:   Old EKG Reviewed: none available     MDM  Patient with hypertension headache. CT is reassuring. Lab work is overall reassuring except for a hyperglycemia. Patient does not have followup. She does not have a primary care Dr. Patient will be followed by the outpatient clinic, with whom I discussed the case. Under their recommendation patient was started on metformin and lisinopril hydrochlorothiazide. We will call her with a followup appointment      I   Juliet Rude. Rubin Payor, MD 12/30/11 [phone]

## 2011-12-30 NOTE — ED Notes (Signed)
Pt c/o generalized HA so took her BP and was elevated; pt denies hx of htn

## 2012-01-07 ENCOUNTER — Ambulatory Visit (INDEPENDENT_AMBULATORY_CARE_PROVIDER_SITE_OTHER): Payer: Self-pay | Admitting: Radiation Oncology

## 2012-01-07 ENCOUNTER — Encounter: Payer: Self-pay | Admitting: Radiation Oncology

## 2012-01-07 ENCOUNTER — Ambulatory Visit (HOSPITAL_COMMUNITY): Payer: No Typology Code available for payment source

## 2012-01-07 ENCOUNTER — Ambulatory Visit (HOSPITAL_COMMUNITY): Payer: Self-pay | Attending: Obstetrics and Gynecology

## 2012-01-07 VITALS — BP 136/92 | HR 83 | Temp 98.2°F | Ht 63.0 in | Wt 177.8 lb

## 2012-01-07 DIAGNOSIS — E119 Type 2 diabetes mellitus without complications: Secondary | ICD-10-CM

## 2012-01-07 DIAGNOSIS — Z79899 Other long term (current) drug therapy: Secondary | ICD-10-CM

## 2012-01-07 DIAGNOSIS — Z8611 Personal history of tuberculosis: Secondary | ICD-10-CM

## 2012-01-07 DIAGNOSIS — I10 Essential (primary) hypertension: Secondary | ICD-10-CM

## 2012-01-07 DIAGNOSIS — E1169 Type 2 diabetes mellitus with other specified complication: Secondary | ICD-10-CM | POA: Insufficient documentation

## 2012-01-07 LAB — BASIC METABOLIC PANEL
CO2: 26 mEq/L (ref 19–32)
Chloride: 98 mEq/L (ref 96–112)
Creat: 0.58 mg/dL (ref 0.50–1.10)

## 2012-01-07 LAB — LIPID PANEL
HDL: 56 mg/dL (ref >39)
LDL Cholesterol: 214 mg/dL — ABNORMAL HIGH (ref 0–99)
Total CHOL/HDL Ratio: 5.7 Ratio
VLDL: 47 mg/dL — ABNORMAL HIGH (ref 0–40)

## 2012-01-07 MED ORDER — ASPIRIN EC 81 MG PO TBEC: 81.0000 mg | DELAYED_RELEASE_TABLET | Freq: Once | ORAL | Status: DC

## 2012-01-07 MED ORDER — METFORMIN HCL 500 MG PO TABS: 1000.0000 mg | ORAL_TABLET | Freq: Two times a day (BID) | ORAL | Status: DC

## 2012-01-07 NOTE — Progress Notes (Signed)
Subjective:    Patient ID: [name], female    DOB: [birth year], 46 y.o.   MRN: [id number]  HPI Patient is a 46 year old woman with PMH significant for newly diagnosed hypertension and type 2 diabetes mellitus (for comprehensive PMH, see "Problem List"). The patient was seen on 12/30/2011 in the ED for complaints of headache, and both her HTN and T2 DM were diagnosed at that time. On review of records in Epic, it appears the patient has actually been mild to moderately hypertensive for some period of time. It is also apparent from review of Epic records that the patient has been hyperglycemic since 01/03/2012, at which time she had a CBG of approximately 300 during preop evaluation for left breast abscess I&D.  The patient states she has been having fatigue, polydipsia and polyuria for approximately one year, which have improved after initiating metformin therapy prescribed in the ED on 12/30/2011. Regarding the headache the patient presented with on 12/30/2011, the patient had a negative CT head in the ED, and states that the headache has since fully resolved.  Past Medical History  Diagnosis Date  . Tuberculosis     was treated  . Gonorrhea   . Chlamydia   . Breast abscess   . Substance abuse     alcohol  . Recurrent upper respiratory infection (URI)     URI/SOB-treated with antibiotics-cleared up now  . Depression     PTSD also  . Anemia     when in late teens   Past Surgical History  Procedure Date  . Tongue surgery 2008    lump removed  . Incise and drain abcess 01/03/11    breast abscess   Current Outpatient Prescriptions on File Prior to Visit  Medication Sig Dispense Refill  . ibuprofen (ADVIL,MOTRIN) 200 MG tablet Take 200 mg by mouth every 6 (six) hours as needed. For pain      . lisinopril-hydrochlorothiazide (PRINZIDE,ZESTORETIC) 10-12.5 MG per tablet Take 1 tablet by mouth daily.  30 tablet  0  . metFORMIN (GLUCOPHAGE) 500 MG tablet Take 2 tablets (1,000 mg  total) by mouth 2 (two) times daily with a meal.  60 tablet  0       Review of Systems  Constitutional: Negative for fever and chills.  HENT: Negative.   Eyes: Negative.   Respiratory: Negative for shortness of breath and wheezing.   Cardiovascular: Negative for chest pain and leg swelling.  Gastrointestinal: Negative for nausea, vomiting and diarrhea.  Genitourinary: Negative for dysuria and hematuria.  Musculoskeletal: Negative.   Skin: Negative.   Neurological: Negative.   Hematological: Negative.   Psychiatric/Behavioral: Negative.        Objective:   Physical Exam  Constitutional: She is oriented to person, place, and time. She appears well-developed and well-nourished. No distress.  HENT:  Head: Normocephalic and atraumatic.  Eyes: Pupils are equal, round, and reactive to light. No scleral icterus.  Neck: Normal range of motion. Neck supple. No tracheal deviation present.  Cardiovascular: Normal rate and regular rhythm.   No murmur heard. Pulmonary/Chest: Effort normal. She has no wheezes. She has no rales.  Abdominal: Soft. Bowel sounds are normal. She exhibits no distension. There is no tenderness.  Musculoskeletal: Normal range of motion. She exhibits no edema and no tenderness.  Neurological: She is alert and oriented to person, place, and time. No cranial nerve deficit.  Skin: Skin is warm and dry. No erythema.  Psychiatric: She has a normal mood and affect. Her behavior  is normal.          Assessment & Plan:

## 2012-01-07 NOTE — Assessment & Plan Note (Signed)
BP is improved today after initiation of lisinopril/HCTZ therapy, although it remains borderline high. Despite that the patient does not have stage II hypertension, we will continue delays in therapy at this time considering that the patient is still barely within target blood pressure (diastolic actually slightly elevated). -BMP -Continue several/HCTZ 10/12.5 mg (unless BMP is abnormal) -Followup in 2 weeks for BP recheck

## 2012-01-07 NOTE — Assessment & Plan Note (Signed)
A1c = 12.1. As the patient has no other serious medical comorbidities, her diabetes does not necessitate insulin therapy at this time. Instead, the patient's metformin dose to 500 mg twice a day prescribed on 12/30/2011 will be increased to 1000 mg twice a day. The patient was instructed to followup within 1-2 weeks so that she can meet with the diabetic educator and have her blood sugar reassessed in the clinic. -Metformin 1000 mg twice a day

## 2012-01-07 NOTE — Patient Instructions (Signed)
Instructions: -Your metformin dose was increased as we discussed. Please begin taking the increased dose as prescribed. -Continue taking your lisinopril/HCTZ - Return to clinic in 1-2 weeks for follow-up. We will also have you see the diabetic educator at that time.  Diabetes Meal Planning Guide The diabetes meal planning guide is a tool to help you plan your meals and snacks. It is important for people with diabetes to manage their blood glucose (sugar) levels. Choosing the right foods and the right amounts throughout your day will help control your blood glucose. Eating right can even help you improve your blood pressure and reach or maintain a healthy weight. CARBOHYDRATE COUNTING MADE EASY When you eat carbohydrates, they turn to sugar. This raises your blood glucose level. Counting carbohydrates can help you control this level so you feel better. When you plan your meals by counting carbohydrates, you can have more flexibility in what you eat and balance your medicine with your food intake. Carbohydrate counting simply means adding up the total amount of carbohydrate grams in your meals and snacks. Try to eat about the same amount at each meal. Foods with carbohydrates are listed below. Each portion below is 1 carbohydrate serving or 15 grams of carbohydrates. Ask your dietician how many grams of carbohydrates you should eat at each meal or snack. Grains and Starches  1 slice bread.   English muffin or hotdog/hamburger bun.   cup cold cereal (unsweetened).   cup cooked pasta or rice.   cup starchy vegetables (corn, potatoes, peas, beans, winter squash).  1 tortilla (6 inches).   bagel.  1 waffle or pancake (size of a CD).   cup cooked cereal.  4 to 6 small crackers. *Whole grain is recommended. Fruit  1 cup fresh unsweetened berries, melon, papaya, pineapple.  1 small fresh fruit.   banana or mango.   cup fruit juice (4 oz unsweetened).   cup canned fruit in  natural juice or water.  2 tbs dried fruit.  12 to 15 grapes or cherries. Milk and Yogurt  1 cup fat-free or 1% milk.  1 cup soy milk.  6 oz light yogurt with sugar-free sweetener.  6 oz low-fat soy yogurt.  6 oz plain yogurt. Vegetables  1 cup raw or  cup cooked is counted as 0 carbohydrates or a "free" food.  If you eat 3 or more servings at 1 meal, count them as 1 carbohydrate serving. Other Carbohydrates   oz chips or pretzels.   cup ice cream or frozen yogurt.   cup sherbet or sorbet.  2 inch square cake, no frosting.  1 tbs honey, sugar, jam, jelly, or syrup.  2 small cookies.  3 squares of graham crackers.  3 cups popcorn.  6 crackers.  1 cup broth-based soup.  Count 1 cup casserole or other mixed foods as 2 carbohydrate servings.  Foods with less than 20 calories in a serving may be counted as 0 carbohydrates or a "free" food. You may want to purchase a book or computer software that lists the carbohydrate gram counts of different foods. In addition, the nutrition facts panel on the labels of the foods you eat are a good source of this information. The label will tell you how big the serving size is and the total number of carbohydrate grams you will be eating per serving. Divide this number by 15 to obtain the number of carbohydrate servings in a portion. Remember, 1 carbohydrate serving equals 15 grams of carbohydrate. SERVING SIZES  Measuring foods and serving sizes helps you make sure you are getting the right amount of food. The list below tells how big or small some common serving sizes are.  1 oz.........4 stacked dice.  3 oz........Marland KitchenDeck of cards.  1 tsp.......Marland KitchenTip of little finger.  1 tbs......Marland KitchenMarland KitchenThumb.  2 tbs.......Marland KitchenGolf ball.   cup......Marland KitchenHalf of a fist.  1 cup.......Marland KitchenA fist. SAMPLE DIABETES MEAL PLAN Below is a sample meal plan that includes foods from the grain and starches, dairy, vegetable, fruit, and meat groups. A dietician  can individualize a meal plan to fit your calorie needs and tell you the number of servings needed from each food group. However, controlling the total amount of carbohydrates in your meal or snack is more important than making sure you include all of the food groups at every meal. You may interchange carbohydrate containing foods (dairy, starches, and fruits). The meal plan below is an example of a 2000 calorie diet using carbohydrate counting. This meal plan has 17 carbohydrate servings. Breakfast  1 cup oatmeal (2 carb servings).   cup light yogurt (1 carb serving).  1 cup blueberries (1 carb serving).   cup almonds. Snack  1 large apple (2 carb servings).  1 low-fat string cheese stick. Lunch  Chicken breast salad.  1 cup spinach.   cup chopped tomatoes.  2 oz chicken breast, sliced.  2 tbs low-fat [place] dressing.  12 whole-wheat crackers (2 carb servings).  12 to 15 grapes (1 carb serving).  1 cup low-fat milk (1 carb serving). Snack  1 cup carrots.   cup hummus (1 carb serving). Dinner  3 oz broiled salmon.  1 cup brown rice (3 carb servings). Snack  1  cups steamed broccoli (1 carb serving) drizzled with 1 tsp olive oil and lemon juice.  1 cup light pudding (2 carb servings). DIABETES MEAL PLANNING WORKSHEET Your dietician can use this worksheet to help you decide how many servings of foods and what types of foods are right for you.  BREAKFAST Food Group and Servings / Carb Servings Grain/Starches __________________________________ Dairy __________________________________________ Vegetable ______________________________________ Fruit ___________________________________________ Meat __________________________________________ Fat ____________________________________________ LUNCH Food Group and Servings / Carb Servings Grain/Starches ___________________________________ Dairy ___________________________________________ Fruit  ____________________________________________ Meat ___________________________________________ Fat _____________________________________________ Laural Golden Food Group and Servings / Carb Servings Grain/Starches ___________________________________ Dairy ___________________________________________ Fruit ____________________________________________ Meat ___________________________________________ Fat _____________________________________________ SNACKS Food Group and Servings / Carb Servings Grain/Starches ___________________________________ Dairy ___________________________________________ Vegetable _______________________________________ Fruit ____________________________________________ Meat ___________________________________________ Fat _____________________________________________ DAILY TOTALS Starches _________________________ Vegetable ________________________ Fruit ____________________________ Dairy ____________________________ Meat ____________________________ Fat ______________________________ Document Released: 10/04/2004 Document Revised: 04/01/2011 Document Reviewed: 08/15/2008 [employer] Patient Information 2013 [place], [place].

## 2012-01-09 ENCOUNTER — Telehealth (HOSPITAL_COMMUNITY): Payer: Self-pay | Admitting: *Deleted

## 2012-01-09 NOTE — Telephone Encounter (Signed)
Telephoned patient and left message to return call to [facility] 

## 2012-01-29 ENCOUNTER — Encounter (HOSPITAL_COMMUNITY): Payer: Self-pay | Admitting: *Deleted

## 2012-02-04 ENCOUNTER — Ambulatory Visit (INDEPENDENT_AMBULATORY_CARE_PROVIDER_SITE_OTHER): Payer: Self-pay | Admitting: Radiation Oncology

## 2012-02-04 ENCOUNTER — Ambulatory Visit (INDEPENDENT_AMBULATORY_CARE_PROVIDER_SITE_OTHER): Payer: Self-pay | Admitting: Dietician

## 2012-02-04 ENCOUNTER — Encounter: Payer: Self-pay | Admitting: Radiation Oncology

## 2012-02-04 ENCOUNTER — Telehealth: Payer: Self-pay | Admitting: *Deleted

## 2012-02-04 VITALS — BP 136/92 | HR 84 | Temp 98.0°F | Ht 66.0 in | Wt 177.9 lb

## 2012-02-04 DIAGNOSIS — I10 Essential (primary) hypertension: Secondary | ICD-10-CM

## 2012-02-04 DIAGNOSIS — F329 Major depressive disorder, single episode, unspecified: Secondary | ICD-10-CM

## 2012-02-04 DIAGNOSIS — E785 Hyperlipidemia, unspecified: Secondary | ICD-10-CM

## 2012-02-04 DIAGNOSIS — E119 Type 2 diabetes mellitus without complications: Secondary | ICD-10-CM

## 2012-02-04 DIAGNOSIS — F32A Depression, unspecified: Secondary | ICD-10-CM

## 2012-02-04 DIAGNOSIS — Z8611 Personal history of tuberculosis: Secondary | ICD-10-CM

## 2012-02-04 DIAGNOSIS — Z23 Encounter for immunization: Secondary | ICD-10-CM

## 2012-02-04 DIAGNOSIS — Z79899 Other long term (current) drug therapy: Secondary | ICD-10-CM

## 2012-02-04 LAB — BASIC METABOLIC PANEL
Calcium: 9.5 mg/dL (ref 8.4–10.5)
Chloride: 101 mEq/L (ref 96–112)
Creat: 0.57 mg/dL (ref 0.50–1.10)

## 2012-02-04 LAB — POCT GLYCOSYLATED HEMOGLOBIN (HGB A1C): Hemoglobin A1C: 11

## 2012-02-04 LAB — TSH: TSH: 3.399 u[IU]/mL (ref 0.350–4.500)

## 2012-02-04 MED ORDER — LISINOPRIL-HYDROCHLOROTHIAZIDE 10-12.5 MG PO TABS: 1.0000 | ORAL_TABLET | Freq: Every day | ORAL | Status: DC

## 2012-02-04 MED ORDER — METFORMIN HCL 500 MG PO TABS: 1000.0000 mg | ORAL_TABLET | Freq: Two times a day (BID) | ORAL | Status: DC

## 2012-02-04 MED ORDER — CITALOPRAM HYDROBROMIDE 20 MG PO TABS: 20.0000 mg | ORAL_TABLET | Freq: Every day | ORAL | Status: DC

## 2012-02-04 MED ORDER — CITALOPRAM HYDROBROMIDE 20 MG PO TABS: ORAL_TABLET | ORAL | Status: DC

## 2012-02-04 MED ORDER — PRAVASTATIN SODIUM 40 MG PO TABS: 40.0000 mg | ORAL_TABLET | Freq: Every evening | ORAL | Status: DC

## 2012-02-04 NOTE — Patient Instructions (Signed)
General instructions: - Please continue taking your metformin and HCTZ-lisinopril as prescribed.  - Please start taking your new prescriptions, celexa and pravastatin, as prescribed.  - Take celexa 20mg  for one week, then increase the dose to 40mg  (2 pills) unless you are having side effects.  - We will see you back in approximately 1 month. Have a great day!

## 2012-02-04 NOTE — Telephone Encounter (Signed)
Pt called and stated pharm did not have her meds from visit today, i called the pharm gave the scripts and changed some of your #'s, metformin changed to 120, citalopram changed to 50, please change med list to reflect this.  Thanks, h.

## 2012-02-05 DIAGNOSIS — F329 Major depressive disorder, single episode, unspecified: Secondary | ICD-10-CM | POA: Insufficient documentation

## 2012-02-05 DIAGNOSIS — E785 Hyperlipidemia, unspecified: Secondary | ICD-10-CM | POA: Insufficient documentation

## 2012-02-05 LAB — MICROALBUMIN / CREATININE URINE RATIO
Creatinine, Urine: 51.1 mg/dL
Microalb, Ur: 14.35 mg/dL — ABNORMAL HIGH (ref 0.00–1.89)

## 2012-02-05 NOTE — Assessment & Plan Note (Signed)
Given patient's diagnosis of type 2 diabetes mellitus, her LDL goal is <100. If she is unable to receive adequate reduction in her current LDL to get her to her goal LDL, she will be switched to a more potent statin after she obtains medication assistance (currently no insurance, but she is to meet with Rudell Cobb after her visit to discuss the issue).  - pravastatin 40mg  QD  Lab Results  Component Value Date   LDLCALC 214* 01/07/2012

## 2012-02-05 NOTE — Progress Notes (Signed)
Diabetes Self-Management Training (DSMT)  Initial Visit  02/05/2012 [name], identified by name and date of birth, is a 47 y.o. female with Type 2 Diabetes. Year of diabetes diagnosis: 2014 Other persons present: no  ASSESSMENT Patient concerns are Nutrition/meal planning, Support and tearful about diagnosis today and in a hurry to pick up children.  Lab Results  Component Value Date   LDLCALC 214* 01/07/2012   Lab Results  Component Value Date   HGBA1C 11.0 02/04/2012  Family history of diabetes: No Patients belief/attitude about diabetes: Diabetes can be controlled. Self foot exams daily: No Diabetes Complications: None Support systems: children Special needs: None Prior DM Education: No   Medications need to assess at future visit   Exercise Plan Walks as much as she can Stopped walking for years, but has been trying to restart.   Self-Monitoring need to assess at future visit   Meal Planning Limited knowledge- this is her interest today   Assessment comments: patient verbalized motivation to learn how to care for self and make healthy lifestyle changes    INDIVIDUAL DIABETES EDUCATION PLAN:  Diabetes disease state Nutrition management Physical activity and exercise Medication Monitoring Acute complication: Chronic complications Psychosocial adjustment _______________________________________________________________________  Intervention TOPICS COVERED TODAY:  Nutrition management  Role of diet in the treatment of diabetes and the relationship between the three main macronutritents and blood glucose control. Physical activity and exercise  Role of exercise on diabetes management, blood pressure control and cardiac health.  PATIENTS GOALS/PLAN (copy and paste in patient instructions so patient receives a copy): 1.  Learning Objective:       State importance of eating healthy and exercise in self care of diaqbtes 2.  Behavioral Objective:   Nutrition: To improve blood glucose control I will follow meal plan of healhty plate/balanced meals Half of the time 50%  Personalized Follow-Up Plan for Ongoing Self Management Support:  Doctor's Office, family and CDE visits ______________________________________________________________________   Outcomes Expected outcomes: Demonstrated interest in learning.Expect positive changes in lifestyle. Self-care Barriers: Lack of material resources Education material provided: living with diabetes picture book- vision is blurred today Patient to contact team via Phone if problems or questions. Time in: 1500     Time out: 1530 Future DSMT - 4-6 wks   Czar Ysaguirre, Lupita Leash

## 2012-02-05 NOTE — Assessment & Plan Note (Addendum)
Lab Results  Component Value Date   HGBA1C 11.0 02/04/2012   HGBA1C 12.1 01/07/2012     Assessment:  Diabetes control: poor control (HgbA1C >9%)  Progress toward A1C goal:  improved  Comments: fair improvement after starting metformin approximately one month ago. Patient appears to still have many unhealthy eating habits however, and was counseled that if we cannot better control her A1c she may need to start insulin.  Plan:  Medications:  continue current medications  Home glucose monitoring:   Frequency:     Timing:    Instruction/counseling given: discussed diet  Educational resources provided:    Self management tools provided:    Other plans: The patient will RTC in approximately one month for follow-up on this issue. If her A1c is not significantly improved in 1-2 months, she may need to start insulin therapy. She also was noted to have microalbuminuria today, thus may consider upwards titration of her ACE-inhibitor at her next visit.

## 2012-02-05 NOTE — Assessment & Plan Note (Signed)
BP Readings from Last 3 Encounters:  02/04/12 136/92  01/07/12 136/92  12/30/11 153/97    Lab Results  Component Value Date   NA 135 02/04/2012   K 4.7 02/04/2012   CREATININE 0.57 02/04/2012    Assessment:  Blood pressure control: controlled  Progress toward BP goal:  at goal  Comments:   Plan:  Medications:  continue current medications  Educational resources provided:    Self management tools provided:    Other plans: continue lisinopril-HCTZ 10/12.5 QD

## 2012-02-05 NOTE — Progress Notes (Signed)
  Subjective:    Patient ID: [name], female    DOB: 1965/12/19, 47 y.o.   MRN: [id number]  HPI Ms. Randie Heinz presents to clinic today for a follow-up visit after recently establishing care for treatment of newly diagnosed hypertension and type 2 diabetes mellitus. She states she has been taking her lisinopril-HCTZ and metformin as prescribed. She appears a little tearful and admits that she has been feeling somewhat depressed recently, particularly as the holidays have just passed. She complains of decreased energy and some mild anhedonia, but states she has been sleeping well. She admits to occasional auditory hallucinations for the last few weeks, such as thinking she hears someone call her name when no one is there. She denies any command hallucinations or any negative aspects to these experiences. She denies any suicidal or homicidal ideation. She states that she is not on any anti-depressant medications but that she has previously been prescribed seroquel and paxil at different times, and responded well to the paxil.   She denies any other complaints today.    Review of Systems  All other systems reviewed and are negative.       Objective:   Physical Exam  Constitutional: She is oriented to person, place, and time. She appears well-developed and well-nourished. No distress.  HENT:  Head: Normocephalic and atraumatic.  Eyes: Pupils are equal, round, and reactive to light. No scleral icterus.  Neck: Neck supple. No tracheal deviation present.  Cardiovascular: Normal rate and regular rhythm.   No murmur heard. Pulmonary/Chest: Effort normal. She has no wheezes. She has no rales.  Abdominal: Soft. Bowel sounds are normal. She exhibits no distension.  Musculoskeletal: Normal range of motion. She exhibits no edema.  Neurological: She is alert and oriented to person, place, and time. No cranial nerve deficit.  Skin: Skin is warm and dry. No erythema.  Psychiatric: Her behavior is  normal. Judgment and thought content normal. Her affect is labile.          Assessment & Plan:

## 2012-02-05 NOTE — Assessment & Plan Note (Addendum)
Patient's complaints are consistent with major depressive disorder, of which she has a previous history. No SI/HI. She was started on celexa as it should be $4 at [facility], the patient's pharmacy. TSH checked and wnl.  - celexa 20mg  QD x 1 week, then 40mg  QD if tolerating well - f/u in 1 month

## 2012-02-11 ENCOUNTER — Encounter (HOSPITAL_COMMUNITY): Payer: Self-pay

## 2012-02-11 ENCOUNTER — Ambulatory Visit (HOSPITAL_COMMUNITY)
Admission: RE | Admit: 2012-02-11 | Discharge: 2012-02-11 | Disposition: A | Discharge status: Home / Self Care | Payer: Self-pay | Source: Ambulatory Visit | Attending: Obstetrics and Gynecology | Admitting: Obstetrics and Gynecology

## 2012-02-11 ENCOUNTER — Other Ambulatory Visit: Payer: Self-pay | Admitting: Obstetrics and Gynecology

## 2012-02-11 VITALS — BP 120/76 | Temp 98.6°F | Ht 63.0 in | Wt 179.0 lb

## 2012-02-11 DIAGNOSIS — Z1231 Encounter for screening mammogram for malignant neoplasm of breast: Secondary | ICD-10-CM

## 2012-02-11 DIAGNOSIS — Z1239 Encounter for other screening for malignant neoplasm of breast: Secondary | ICD-10-CM

## 2012-02-11 NOTE — Patient Instructions (Signed)
Reviewed with patient how to perform BSE. Patient did not need a Pap smear today due to last Pap smear was 11/13/2010. Let her know [facility] will cover Pap smears every 3 years unless has a history of abnormal Pap smears. Patient escorted to mammography for a screening mammogram. Let patient know will follow up with her within the next couple weeks with results by letter or phone. Patient verbalized understanding.

## 2012-02-11 NOTE — Progress Notes (Signed)
No complaints today.  Pap Smear:    Pap smear not completed today. Last Pap smear was 11/13/2010 at [facility] and normal. Per patient no history of abnormal Pap smears. Pap smear result above in EPIC.  Physical exam: Breasts Breasts symmetrical. No skin abnormalities right breast. Patient has a scar on her left breast at 5 o'clock next to areola where she had surgery due to left breast abscess. Nipple retraction bilateral breasts per patient is normal for her. No nipple discharge bilateral breasts. No lymphadenopathy. No lumps palpated bilateral breasts. No complaints of pain or tenderness on exam. Patient escorted to mammography for a screening mammogram.        Pelvic/Bimanual No Pap smear completed today since last Pap smear was 11/13/2010. Pap smear not indicated per [facility] guidelines.

## 2012-05-19 ENCOUNTER — Ambulatory Visit (INDEPENDENT_AMBULATORY_CARE_PROVIDER_SITE_OTHER): Payer: Self-pay | Admitting: Radiation Oncology

## 2012-05-19 ENCOUNTER — Ambulatory Visit: Payer: Self-pay | Admitting: Dietician

## 2012-05-19 VITALS — BP 183/109 | HR 83 | Temp 98.3°F | Ht 64.0 in | Wt 176.5 lb

## 2012-05-19 DIAGNOSIS — F329 Major depressive disorder, single episode, unspecified: Secondary | ICD-10-CM

## 2012-05-19 DIAGNOSIS — Z8611 Personal history of tuberculosis: Secondary | ICD-10-CM

## 2012-05-19 DIAGNOSIS — E119 Type 2 diabetes mellitus without complications: Secondary | ICD-10-CM

## 2012-05-19 DIAGNOSIS — E785 Hyperlipidemia, unspecified: Secondary | ICD-10-CM

## 2012-05-19 DIAGNOSIS — I10 Essential (primary) hypertension: Secondary | ICD-10-CM

## 2012-05-19 LAB — POCT GLYCOSYLATED HEMOGLOBIN (HGB A1C): Hemoglobin A1C: 10.3

## 2012-05-19 MED ORDER — LISINOPRIL-HYDROCHLOROTHIAZIDE 10-12.5 MG PO TABS: 1.0000 | ORAL_TABLET | Freq: Every day | ORAL | Status: DC

## 2012-05-19 MED ORDER — PRAVASTATIN SODIUM 40 MG PO TABS: 40.0000 mg | ORAL_TABLET | Freq: Every evening | ORAL | Status: DC

## 2012-05-19 MED ORDER — CITALOPRAM HYDROBROMIDE 20 MG PO TABS: ORAL_TABLET | ORAL | Status: DC

## 2012-05-19 MED ORDER — METFORMIN HCL 500 MG PO TABS: 1000.0000 mg | ORAL_TABLET | Freq: Two times a day (BID) | ORAL | Status: DC

## 2012-05-19 NOTE — Assessment & Plan Note (Signed)
Lab Results  Component Value Date   HGBA1C 10.3 05/19/2012   HGBA1C 11.0 02/04/2012   HGBA1C 12.1 01/07/2012     Assessment: Diabetes control: poor control (HgbA1C >9%) Progress toward A1C goal:  unchanged Comments: pt has been largely non-compliant with medications since her visit in 01/2012.   Plan: Medications:  continue current medications Home glucose monitoring: Frequency:   Timing:   Instruction/counseling given: reminded to bring medications to each visit Educational resources provided:   Self management tools provided:   Other plans: patient has been noncompliant with metformin since her last visit, thus will need to first establish regular compliance with antihyperglycemic medications before further escalating her regimen. If her A1c in 3 months is >7 and she has been fully compliant with metformin, recommend addition of sulfonylurea therapy. Insulin would of course be a superior therapeutic option, however severely doubt that the patient would be compliant with insulin therapy given her difficulties with consistently taking a limited regimen of once/twice a day oral medications.

## 2012-05-19 NOTE — Assessment & Plan Note (Signed)
BP Readings from Last 3 Encounters:  05/19/12 183/109  02/11/12 120/76  02/04/12 136/92    Lab Results  Component Value Date   NA 135 02/04/2012   K 4.7 02/04/2012   CREATININE 0.57 02/04/2012    Assessment: Blood pressure control: moderately elevated Progress toward BP goal:  Deteriorated. Comments: Blood pressure is elevated today secondary to medication noncompliance.  Plan: Medications:  Cont lisinopril-HCTZ 10-12.5mg  QD (the importance of compliance with medications was stressed during today's visit) Educational resources provided:   Self management tools provided:   Other plans: Return to clinic for blood pressure recheck (and to followup on depression) in one month.

## 2012-05-19 NOTE — Progress Notes (Signed)
Subjective:    Patient ID: [name], female    DOB: Jun 05, 1965, 47 y.o.   MRN: [id number]  Diabetes Hypoglycemia symptoms include nervousness/anxiousness. Pertinent negatives for diabetes include no chest pain.   Patient presents to clinic today for a routine visit to followup on her diabetes mellitus, hypertension, and depression.  Depression: Celexa was prescribed for the patient on her previous visit in 01/2012, due to complaints consistent with depression. Unfortunately, she states that she never filled this medication as she was concerned that it might react with her other medications. She also was unable to come in for a one-month followup visit on this issue. Her symptoms persist unchanged.  T2DM: patient states she's been taking her metformin irregularly due to concerns of running out of her prescriptions prior to her next visit. The patient had only been given 1 refill for her metformin on her visit in 01/2012, as she was instructed to return for followup within one month, however she was unable to make an appointment during this period of time and did not call to request refills.   HTN: Patient states she's been noncompliant with lisinopril-HCTZ as she has also run out of this prescription and had not requested a refill.   Patient has no new complaints today, and states she's feeling well.   Review of Systems  Constitutional: Negative for fever and chills.  HENT: Negative.   Respiratory: Negative for cough and shortness of breath.   Cardiovascular: Negative for chest pain and leg swelling.  Gastrointestinal: Negative for nausea, vomiting and diarrhea.  Endocrine: Negative.   Genitourinary: Negative.   Musculoskeletal: Negative.   Skin: Negative.   Allergic/Immunologic: Negative.   Neurological: Negative.   Hematological: Negative.   Psychiatric/Behavioral: Positive for dysphoric mood. Negative for suicidal ideas. The patient is nervous/anxious.   All other systems  reviewed and are negative.   Current Outpatient Medications: Current Outpatient Prescriptions  Medication Sig Dispense Refill  . aspirin EC 81 MG tablet Take 1 tablet (81 mg total) by mouth once.  30 tablet  5  . citalopram (CELEXA) 20 MG tablet Take 1 tablet (20mg ) daily for one week, then take 2 tablets (40mg ) daily.  60 tablet  3  . ibuprofen (ADVIL,MOTRIN) 200 MG tablet Take 200 mg by mouth every 6 (six) hours as needed. For pain      . lisinopril-hydrochlorothiazide (PRINZIDE,ZESTORETIC) 10-12.5 MG per tablet Take 1 tablet by mouth daily.  30 tablet  11  . metFORMIN (GLUCOPHAGE) 500 MG tablet Take 2 tablets (1,000 mg total) by mouth 2 (two) times daily with a meal.  120 tablet  11  . pravastatin (PRAVACHOL) 40 MG tablet Take 1 tablet (40 mg total) by mouth every evening.  30 tablet  11   No current facility-administered medications for this visit.    Allergies: No Known Allergies   Past Medical History: Past Medical History  Diagnosis Date  . Tuberculosis     was treated  . Gonorrhea   . Chlamydia   . Breast abscess   . Substance abuse     alcohol  . Recurrent upper respiratory infection (URI)     URI/SOB-treated with antibiotics-cleared up now  . Depression     PTSD also  . Anemia     when in late teens    Past Surgical History: Past Surgical History  Procedure Laterality Date  . Tongue surgery  2008    lump removed  . Incise and drain abcess  01/03/11  breast abscess    Family History: Family History  Problem Relation Age of Onset  . Cancer Mother     breast  . Cancer Sister     breast  . Cancer Paternal Aunt     breast    Social History: History   Social History  . Marital Status: Single    Spouse Name: N/A    Number of Children: N/A  . Years of Education: N/A   Occupational History  . Not on file.   Social History Main Topics  . Smoking status: Current Some Day Smoker -- 0.25 packs/day for 30 years    Types: Cigarettes  . Smokeless  tobacco: Never Used  . Alcohol Use: 0.0 oz/week    14-16 Cans of beer per week     Comment: quit "heavy drinking" 3 yrs ago- just recently started back about 6 months ago does not drink daily no-last time was about 3 days ago-3 cans of beer  . Drug Use: No  . Sexually Active: Yes    Birth Control/ Protection: None   Other Topics Concern  . Not on file   Social History Narrative  . No narrative on file     Vital Signs: Blood pressure 183/109, pulse 83, temperature 98.3 F (36.8 C), temperature source Oral, height 5\' 4"  (1.626 m), weight 176 lb 8 oz (80.06 kg), SpO2 96.00%.      Objective:   Physical Exam  Constitutional: She is oriented to person, place, and time. She appears well-developed and well-nourished. No distress.  HENT:  Head: Normocephalic and atraumatic.  Eyes: Conjunctivae are normal. Pupils are equal, round, and reactive to light. No scleral icterus.  Neck: Normal range of motion. Neck supple. No tracheal deviation present.  Cardiovascular: Normal rate and regular rhythm.   No murmur heard. Pulmonary/Chest: Effort normal. She has no wheezes. She has no rales.  Abdominal: Soft. Bowel sounds are normal. She exhibits no distension. There is no tenderness.  Musculoskeletal: Normal range of motion. She exhibits no edema.  Neurological: She is alert and oriented to person, place, and time. No cranial nerve deficit.  Skin: Skin is warm and dry. No erythema.  Psychiatric: She has a normal mood and affect. Her behavior is normal.          Assessment & Plan:

## 2012-05-19 NOTE — Assessment & Plan Note (Signed)
Patient was instructed to begin taking her Celexa as was prescribed at her previous visit, and was reassured regarding the low probability of significant medication interactions with her other medicines. She was again counseled on the possibility of worsening depression and possible suicidal ideation during the first few weeks of SSRI therapy, and to seek help emergently if needed.  - Celexa 20 mg daily x1 week, titrating to 40 mg daily if well tolerated - Return to clinic to followup on this issue in one month

## 2012-05-19 NOTE — Patient Instructions (Addendum)
   Please take all of your medications as prescribed.  Start taking Celexa as was prescribed on your previous visit.  We will have you return to clinic in approximately one month for followup.  Have a great day.

## 2012-05-19 NOTE — Progress Notes (Signed)
Patient left without being seen today. Will try to reschedule.

## 2012-05-25 NOTE — Progress Notes (Signed)
Case discussed with Dr. Emory McTyre  at the time of the visit, immediately after the resident saw the patient.  I reviewed the resident's history and exam and pertinent patient test results.  I agree with the assessment, diagnosis and plan of care documented in the resident's note. 

## 2012-05-26 ENCOUNTER — Encounter: Payer: Self-pay | Admitting: Radiation Oncology

## 2012-06-16 ENCOUNTER — Encounter: Payer: Self-pay | Admitting: Radiation Oncology

## 2012-06-23 ENCOUNTER — Encounter: Payer: Self-pay | Admitting: Radiation Oncology

## 2012-07-01 ENCOUNTER — Encounter: Payer: Self-pay | Admitting: Dietician

## 2012-07-07 ENCOUNTER — Encounter: Payer: Self-pay | Admitting: Radiation Oncology

## 2012-07-07 ENCOUNTER — Ambulatory Visit (INDEPENDENT_AMBULATORY_CARE_PROVIDER_SITE_OTHER): Payer: Self-pay | Admitting: Radiation Oncology

## 2012-07-07 VITALS — BP 146/92 | HR 78 | Temp 99.0°F | Ht 64.0 in | Wt 172.9 lb

## 2012-07-07 DIAGNOSIS — I10 Essential (primary) hypertension: Secondary | ICD-10-CM

## 2012-07-07 DIAGNOSIS — E119 Type 2 diabetes mellitus without complications: Secondary | ICD-10-CM

## 2012-07-07 MED ORDER — LOSARTAN POTASSIUM-HCTZ 50-12.5 MG PO TABS: 1.0000 | ORAL_TABLET | Freq: Every day | ORAL | Status: DC

## 2012-07-07 NOTE — Assessment & Plan Note (Signed)
Lab Results  Component Value Date   HGBA1C 10.3 05/19/2012   HGBA1C 11.0 02/04/2012   HGBA1C 12.1 01/07/2012     Assessment: Diabetes control: poor control (HgbA1C >9%) Progress toward A1C goal:  unable to assess Comments:   Plan: Medications:  continue current medications Home glucose monitoring: Frequency:   Timing:   Instruction/counseling given: reminded to bring blood glucose meter & log to each visit Educational resources provided:   Self management tools provided:   Other plans: The patient has not been compliant with metformin for the majority of time since she was last seen 6 weeks ago. Although she is due for a repeat A1c in approximately one month, this will likely be mostly reflective of a period where she has not been compliant with her antihyperglycemic medications. Her A1c is elevated enough to warrant initiation of insulin therapy, however as the patient does not have insurance and as she has not been able to be compliant with oral medications it seems that insulin would be a poor choice at this time as the likelihood that she would be compliant with this is extremely low and would likely introduce undue risk of hypoglycemia. She was given instructions to purchase and began using a glucometer, as she was not able to be provided one today by [facility] Leash given that she has not gotten eligibility for the orange card and has no insurance. We will have her return in one month to recheck her A1c-- if she has obtained the orange card, she should undergo a diabetic eye exam at that time and also will need Pneumovax administered.

## 2012-07-07 NOTE — Patient Instructions (Signed)
General instructions:  Stop taking lisinopril-HCTZ.  Start taking Losartan-HCTZ.  Continue taking your other medications as prescribed.   Please bring the required documentation for the [facility] Card to your next visit.   We will see you back in 1 month. Have a great day.

## 2012-07-07 NOTE — Progress Notes (Signed)
Subjective:    Patient ID: [name], female    DOB: February 24, 1965, 47 y.o.   MRN: [id number]  HPI Patient presents to clinic today for a routine followup visit for hypertension. She states she's been taking her medications as prescribed for the last 2 weeks, however states that she has otherwise not been compliant with her medications for the last ~6 weeks since her previous visit. she complains of a dry cough which she states began approximately 2 weeks ago.  she denies any shortness of breath or chest pain. She denies any other complaints and states she's otherwise feeling well.    Review of Systems  All other systems reviewed and are negative.   Current Outpatient Medications: Current Outpatient Prescriptions  Medication Sig Dispense Refill  . aspirin EC 81 MG tablet Take 1 tablet (81 mg total) by mouth once.  30 tablet  5  . citalopram (CELEXA) 20 MG tablet Take 1 tablet (20mg ) daily for one week, then take 2 tablets (40mg ) daily.  60 tablet  3  . ibuprofen (ADVIL,MOTRIN) 200 MG tablet Take 200 mg by mouth every 6 (six) hours as needed. For pain      . losartan-hydrochlorothiazide (HYZAAR) 50-12.5 MG per tablet Take 1 tablet by mouth daily.  30 tablet  11  . metFORMIN (GLUCOPHAGE) 500 MG tablet Take 2 tablets (1,000 mg total) by mouth 2 (two) times daily with a meal.  120 tablet  11  . pravastatin (PRAVACHOL) 40 MG tablet Take 1 tablet (40 mg total) by mouth every evening.  30 tablet  11   No current facility-administered medications for this visit.    Allergies: No Known Allergies   Past Medical History: Past Medical History  Diagnosis Date  . Tuberculosis     was treated  . Gonorrhea   . Chlamydia   . Breast abscess   . Substance abuse     alcohol  . Recurrent upper respiratory infection (URI)     URI/SOB-treated with antibiotics-cleared up now  . Depression     PTSD also  . Anemia     when in late teens    Past Surgical History: Past Surgical History   Procedure Laterality Date  . Tongue surgery  2008    lump removed  . Incise and drain abcess  01/03/11    breast abscess    Family History: Family History  Problem Relation Age of Onset  . Cancer Mother     breast  . Cancer Sister     breast  . Cancer Paternal Aunt     breast    Social History: History   Social History  . Marital Status: Single    Spouse Name: N/A    Number of Children: N/A  . Years of Education: N/A   Occupational History  . Not on file.   Social History Main Topics  . Smoking status: Current Some Day Smoker -- 0.25 packs/day for 30 years    Types: Cigarettes  . Smokeless tobacco: Never Used  . Alcohol Use: 0.0 oz/week    14-16 Cans of beer per week     Comment: quit "heavy drinking" 3 yrs ago- just recently started back about 6 months ago does not drink daily no-last time was about 3 days ago-3 cans of beer  . Drug Use: No  . Sexually Active: Yes    Birth Control/ Protection: None   Other Topics Concern  . Not on file   Social History Narrative  .  No narrative on file     Vital Signs: Blood pressure 146/92, pulse 78, temperature 99 F (37.2 C), temperature source Oral, height 5\' 4"  (1.626 m), weight 172 lb 14.4 oz (78.427 kg), last menstrual period 05/07/2012, SpO2 98.00%.      Objective:   Physical Exam  Constitutional: She is oriented to person, place, and time. She appears well-developed and well-nourished. No distress.  HENT:  Head: Normocephalic and atraumatic.  Eyes: Conjunctivae are normal. Pupils are equal, round, and reactive to light. No scleral icterus.  Neck: Normal range of motion. Neck supple. No tracheal deviation present.  Pulmonary/Chest: Effort normal. She has no wheezes. She has no rales.  Abdominal: Soft. Bowel sounds are normal. She exhibits no distension. There is no tenderness.  Musculoskeletal: Normal range of motion. She exhibits no edema and no tenderness.  Neurological: She is alert and oriented to  person, place, and time. No cranial nerve deficit.  Skin: Skin is warm. No erythema.  Psychiatric: She has a normal mood and affect. Her behavior is normal.          Assessment & Plan:

## 2012-07-07 NOTE — Assessment & Plan Note (Signed)
BP Readings from Last 3 Encounters:  07/07/12 146/92  05/19/12 183/109  02/11/12 120/76    Lab Results  Component Value Date   NA 135 02/04/2012   K 4.7 02/04/2012   CREATININE 0.57 02/04/2012    Assessment: Blood pressure control: mildly elevated Progress toward BP goal:  improved Comments:   Plan: Medications:  D/c lisinopril-HCTZ. Start losartan-HCTZ 50-12.5mg  daily. Educational resources provided:   Self management tools provided:   Other plans: Blood pressure control is much improved secondary to recent compliance with lisinopril-HCTZ. Unfortunately, it appears the patient has developed cough secondary to lisinopril, thus this was discontinued and she was started on losartan-HCTZ at today's visit.

## 2012-07-08 NOTE — Progress Notes (Signed)
Case discussed with Dr. Lavena Wilkinson immediately after the resident saw the patient. We reviewed the resident's history and exam and pertinent patient test results. I agree with the assessment, diagnosis and plan of care documented in the resident's note.  Unfortunately, [name] medical care is limited by her financial difficulties and lack of insurance.  Dr. Lavena Wilkinson and I discussed her ability to get the orange card and her need for further management of her diabetes. With Metformin, she has dropped from an A1C of 12 down to 10.  This is likely the most we can expect from this single therapy.  She would warrant addition of a second oral therapy or insulin.  She will be followed up closely and continued education for her diabetes will be giving including the importance of getting her diabetes under better control.

## 2012-07-30 ENCOUNTER — Other Ambulatory Visit: Payer: Self-pay

## 2012-08-03 ENCOUNTER — Ambulatory Visit: Payer: Self-pay | Admitting: Dietician

## 2012-08-07 ENCOUNTER — Ambulatory Visit: Payer: Self-pay | Admitting: Dietician

## 2012-08-12 ENCOUNTER — Ambulatory Visit (INDEPENDENT_AMBULATORY_CARE_PROVIDER_SITE_OTHER): Payer: Self-pay | Admitting: Dietician

## 2012-08-12 ENCOUNTER — Encounter: Payer: Self-pay | Admitting: Dietician

## 2012-08-12 ENCOUNTER — Encounter: Payer: Self-pay | Admitting: *Deleted

## 2012-08-12 VITALS — Wt 173.3 lb

## 2012-08-12 DIAGNOSIS — E119 Type 2 diabetes mellitus without complications: Secondary | ICD-10-CM

## 2012-08-12 NOTE — Patient Instructions (Addendum)
Please be sure you make a doctor appointment and one with the financial counselor for August.     your blood sugar today was 262, your weight was 173- 3 pounds less!!!!  Continue to walk 3-5 times a week for about 30 minutes.    Suggestions for eating healthier-   Breakfast egg sandwich with whole grain bread is fine- no sausage needed  Try to eat half a sandwich or fruit for lunch- TRY NOT TO SKIP MEALS  Can try baked chips, oven fried chicken, pork and fish.   Pizza- ONE slice plain cheese pizza is okay with vegetables like a salad with it.   Make an appointment with Lupita Leash  [phone]

## 2012-08-12 NOTE — Progress Notes (Signed)
Diabetes Self-Management Education  Visit Number: Follow-up 1  08/12/2012 Ms. Weston Brass, identified by name and date of birth, is a 47 y.o. female with Type 2diabetes  .      ASSESSMENT  Patient Concerns:     Weight 173 lb 4.8 oz (78.608 kg). Body mass index is 29.73 kg/(m^2).  Lab Results: LDL Cholesterol  Date Value Range Status  01/07/2012 214* 0 - 99 mg/dL Final           Hemoglobin A1C  Date Value Range Status  05/19/2012 10.3   Final     Family History  Problem Relation Age of Onset  . Cancer Mother     breast  . Cancer Sister     breast  . Cancer Paternal Aunt     breast   History  Substance Use Topics  . Smoking status: Current Some Day Smoker -- 0.25 packs/day for 30 years    Types: Cigarettes  . Smokeless tobacco: Never Used  . Alcohol Use: 0.0 oz/week    14-16 Cans of beer per week     Comment: quit "heavy drinking" 3 yrs ago- just recently started back about 6 months ago does not drink daily no-last time was about 3 days ago-3 cans of beer    Support Systems:    Discussed today    Assessment comments:    Diet Recall: egg and sausage sandwich for breakfast,     Individualized Plan for Diabetes Self-Management Training:  Patient individualized diabetes plan discussed today with patient and includes: Done to this date:  Diabetes, Nutrition, medications, monitoring, physical activity, To be done at future visit:  acute complications, chronic complications, Reducing risk, problem solving and goal setting  Education Topics Reviewed with Patient Today:  Topic Points Discussed  Disease State Needs review/Reviewed today  Nutrition Management Needs review/Reviewed today  Physical Activity and Exercise Needs review/Reviewed today  Medications Reviewed patients medication for diabetes, action, purpose, timing of dose and side effects.  Monitoring   Acute Complications    Chronic Complications    Psychosocial Adjustment    Goal Setting  Helped patient develop diabetes management plan for (enter comment)  Preconception Care (if applicable) Other (comment)    PATIENTS GOALS   Learning Objective(s):     Goal The patient agrees to:  Nutrition Follow meal plan discussed  Physical Activity    Medications    Monitoring    Problem Solving    Reducing Risk    Health Coping     Patient Self-Evaluation of Goals (Subsequent Visits)  Goal The patient rates self as meeting goals (% of time)  Nutrition < 25%  Physical Activity    Medications    Monitoring    Problem Solving    Reducing Risk    Health Coping       PERSONALIZED PLAN / SUPPORT  Self-Management Support:  Doctor's office;Family;CDE visits- patient verbalizes anxiety and stress with new diagnoses. Discussed in detail need for a self management support plan. Referral to social work made for now.  ______________________________________________________________________  Outcomes  Expected Outcomes:  Demonstrated interest in learning. Expect positive outcomes Self-Care Barriers:  Impaired vision;Lack of transportation;Lack of material resources Education material provided: yes If problems or questions, patient to contact team via:  Phone Time in: 1430     Time out: 1530  Future DSME appointment: - 4-6 wks   Rahma Meller, [facility]ash 08/12/2012 4:55 PM

## 2012-08-12 NOTE — Progress Notes (Unsigned)
Pt is brought to triage c/o fall appr 2 weeks ago and injured L buttock area, she cannot see the area but because of tenderness to area thought that there was a bruise to area, upon visual exam it is noted that there is no bruising but a firm area appr size of small grapefruit or 8 to 10 cm. appt for fri scheduled

## 2012-08-14 ENCOUNTER — Encounter: Payer: Self-pay | Admitting: Licensed Clinical Social Worker

## 2012-08-14 ENCOUNTER — Ambulatory Visit (INDEPENDENT_AMBULATORY_CARE_PROVIDER_SITE_OTHER): Payer: Self-pay | Admitting: Internal Medicine

## 2012-08-14 ENCOUNTER — Encounter: Payer: Self-pay | Admitting: Internal Medicine

## 2012-08-14 VITALS — BP 125/84 | HR 73 | Temp 98.1°F | Ht 63.5 in | Wt 171.6 lb

## 2012-08-14 DIAGNOSIS — E785 Hyperlipidemia, unspecified: Secondary | ICD-10-CM

## 2012-08-14 DIAGNOSIS — I10 Essential (primary) hypertension: Secondary | ICD-10-CM

## 2012-08-14 DIAGNOSIS — F329 Major depressive disorder, single episode, unspecified: Secondary | ICD-10-CM

## 2012-08-14 DIAGNOSIS — T148XXA Other injury of unspecified body region, initial encounter: Secondary | ICD-10-CM | POA: Insufficient documentation

## 2012-08-14 DIAGNOSIS — E119 Type 2 diabetes mellitus without complications: Secondary | ICD-10-CM

## 2012-08-14 LAB — CBC WITH DIFFERENTIAL/PLATELET
Basophils Relative: 0 % (ref 0–1)
Eosinophils Absolute: 0.3 10*3/uL (ref 0.0–0.7)
Eosinophils Relative: 3 % (ref 0–5)
HCT: 37.9 % (ref 36.0–46.0)
Hemoglobin: 12.7 g/dL (ref 12.0–15.0)
MCH: 29 pg (ref 26.0–34.0)
MCHC: 33.5 g/dL (ref 30.0–36.0)
Monocytes Absolute: 0.4 10*3/uL (ref 0.1–1.0)
Monocytes Relative: 4 % (ref 3–12)
Neutrophils Relative %: 65 % (ref 43–77)

## 2012-08-14 LAB — POCT GLYCOSYLATED HEMOGLOBIN (HGB A1C): Hemoglobin A1C: 8.7

## 2012-08-14 NOTE — Assessment & Plan Note (Signed)
Lab Results  Component Value Date   HGBA1C 8.7 08/14/2012   HGBA1C 10.3 05/19/2012   HGBA1C 11.0 02/04/2012     Assessment: Diabetes control: fair control Progress toward A1C goal:  improved Comments: improving and patient reports compliance   Plan: Medications:  continue current medications Home glucose monitoring:N/A Instruction/counseling given: discussed diet Educational resources provided: brochure;handout Self management tools provided: other (see comments) Other plans: disc. Diet modifications

## 2012-08-14 NOTE — Progress Notes (Signed)
I saw patient and discussed her care with Dr. McLean at the time of the visit.  We reviewed the resident's history and exam and pertinent patient test results.  I agree with the assessment, diagnosis, and plan of care documented in the resident's note. 

## 2012-08-14 NOTE — Assessment & Plan Note (Signed)
[facility] [place]

## 2012-08-14 NOTE — Assessment & Plan Note (Addendum)
Left buttock after mechanical fall. Lesion is not painful  Given information.  Apply warm compress Will check cbc  Return if not resolving with heat within 1 month

## 2012-08-14 NOTE — Assessment & Plan Note (Signed)
BP Readings from Last 3 Encounters:  08/14/12 125/84  07/07/12 146/92  05/19/12 183/109    Lab Results  Component Value Date   NA 135 02/04/2012   K 4.7 02/04/2012   CREATININE 0.57 02/04/2012    Assessment: Blood pressure control: controlled Progress toward BP goal:  at goal Comments: compliance address she is taking BP med  Plan: Medications:  continue current medications. Taking Lisinopril HCTZ 10-12.5 mg due to not being able to afford Hyzaar 50-12.5 mg qd  Educational resources provided: brochure;handout Self management tools provided: other (see comments) Other plans: pending orange card. But this med seems to be controlling BP

## 2012-08-14 NOTE — Patient Instructions (Addendum)
General Instructions: Please keep taking your medications as prescribed Please follow up in 10/2012 for diabetes, HTN If you need counseling seek [facility] [place] walk in hours during the week Please stop smoking  Read information below  Please use heat to your left buttock     Treatment Goals:  Goals (1 Years of Data) as of 08/14/12         As of Today 07/07/12 05/19/12 02/11/12 02/04/12     Blood Pressure    . Blood Pressure < 140/90  125/84 146/92 183/109 120/76 136/92     Result Component    . HEMOGLOBIN A1C < 7.0  8.7  10.3  11.0    . LDL CALC < 100            Progress Toward Treatment Goals:  Treatment Goal 08/14/2012  Hemoglobin A1C improved  Blood pressure at goal  Stop smoking smoking less  Prevent falls unable to assess    Self Care Goals & Plans:  Self Care Goal 08/14/2012  Manage my medications take my medicines as prescribed; bring my medications to every visit; refill my medications on time  Monitor my health keep track of my weight  Eat healthy foods drink diet soda or water instead of juice or soda; eat more vegetables; eat foods that are low in salt; eat baked foods instead of fried foods; eat fruit for snacks and desserts; eat smaller portions  Be physically active find an activity I enjoy  Stop smoking go to the [employer] ([employer]); call [facility] (1-800-QUIT-NOW)  Meeting treatment goals maintain the current self-care plan    Home Blood Glucose Monitoring 08/14/2012  Check my blood sugar -  When to check my blood sugar N/A     Care Management & Community Referrals:  Referral 08/14/2012  Referrals made for care management support none needed  Referrals made to community resources none      Hematoma A hematoma is a pocket of blood that collects under the skin, in an organ, in a body space, in a joint space, or in other tissue. The blood can clot to form a lump that you can see and feel. The lump is  often firm, sore, and sometimes even painful and tender. Most hematomas get better in a few days to weeks. However, some hematomas may be serious and require medical care.Hematomas can range in size from very small to very large. CAUSES  A hematoma can be caused by a blunt or penetrating injury. It can also be caused by leakage from a blood vessel under the skin. Spontaneous leakage from a blood vessel is more likely to occur in elderly people, especially those taking blood thinners. Sometimes, a hematoma can develop after certain medical procedures. SYMPTOMS  Unlike a bruise, a hematoma forms a firm lump that you can feel. This lump is the collection of blood. The collection of blood can also cause your skin to turn a blue to dark blue color. If the hematoma is close to the surface of the skin, it often produces a yellowish color in the skin. DIAGNOSIS  Your caregiver can determine whether you have a hematoma based on your history and a physical exam. TREATMENT  Hematomas usually go away on their own over time. Rarely does the blood need to be drained out of the body. HOME CARE INSTRUCTIONS   Put ice on the injured area.  Put ice in a plastic bag.  Place a towel between your skin  and the bag.  Leave the ice on for 15-20 minutes, 3-4 times a day for the first 1 to 2 days.  After the first 2 days, switch to using warm compresses on the hematoma.  Elevate the injured area to help decrease pain and swelling. Wrapping the area with an elastic bandage may also be helpful. Compression helps to reduce swelling and promotes shrinking of the hematoma. Make sure the bandage is not wrapped too tight.  If your hematoma is on a lower extremity and is painful, crutches may be helpful for a couple days.  Only take over-the-counter or prescription medicines for pain, discomfort, or fever as directed by your caregiver. Most patients can take acetaminophen or ibuprofen for the pain. SEEK IMMEDIATE MEDICAL  CARE IF:   You have increasing pain, or your pain is not controlled with medicine.  You have a fever.  You have worsening swelling or discoloration.  Your skin over the hematoma breaks or starts bleeding. MAKE SURE YOU:   Understand these instructions.  Will watch your condition.  Will get help right away if you are not doing well or get worse. Document Released: 08/22/2003 Document Revised: 04/01/2011 Document Reviewed: 09/10/2010 [facility] Patient Information 2014 [place], [place].  Smoking Cessation Quitting smoking is important to your health and has many advantages. However, it is not always easy to quit since nicotine is a very addictive drug. Often times, people try 3 times or more before being able to quit. This document explains the best ways for you to prepare to quit smoking. Quitting takes hard work and a lot of effort, but you can do it. ADVANTAGES OF QUITTING SMOKING  You will live longer, feel better, and live better.  Your body will feel the impact of quitting smoking almost immediately.  Within 20 minutes, blood pressure decreases. Your pulse returns to its normal level.  After 8 hours, carbon monoxide levels in the blood return to normal. Your oxygen level increases.  After 24 hours, the chance of having a heart attack starts to decrease. Your breath, hair, and body stop smelling like smoke.  After 48 hours, damaged nerve endings begin to recover. Your sense of taste and smell improve.  After 72 hours, the body is virtually free of nicotine. Your bronchial tubes relax and breathing becomes easier.  After 2 to 12 weeks, lungs can hold more air. Exercise becomes easier and circulation improves.  The risk of having a heart attack, stroke, cancer, or lung disease is greatly reduced.  After 1 year, the risk of coronary heart disease is cut in half.  After 5 years, the risk of stroke falls to the same as a nonsmoker.  After 10 years, the risk of lung cancer is  cut in half and the risk of other cancers decreases significantly.  After 15 years, the risk of coronary heart disease drops, usually to the level of a nonsmoker.  If you are pregnant, quitting smoking will improve your chances of having a healthy baby.  The people you live with, especially any children, will be healthier.  You will have extra money to spend on things other than cigarettes. QUESTIONS TO THINK ABOUT BEFORE ATTEMPTING TO QUIT You may want to talk about your answers with your caregiver.  Why do you want to quit?  If you tried to quit in the past, what helped and what did not?  What will be the most difficult situations for you after you quit? How will you plan to handle them?  Who can help you through the tough times? Your family? Friends? A caregiver?  What pleasures do you get from smoking? What ways can you still get pleasure if you quit? Here are some questions to ask your caregiver:  How can you help me to be successful at quitting?  What medicine do you think would be best for me and how should I take it?  What should I do if I need more help?  What is smoking withdrawal like? How can I get information on withdrawal? GET READY  Set a quit date.  Change your environment by getting rid of all cigarettes, ashtrays, matches, and lighters in your home, car, or work. Do not let people smoke in your home.  Review your past attempts to quit. Think about what worked and what did not. GET SUPPORT AND ENCOURAGEMENT You have a better chance of being successful if you have help. You can get support in many ways.  Tell your family, friends, and co-workers that you are going to quit and need their support. Ask them not to smoke around you.  Get individual, group, or telephone counseling and support. Programs are available at [employer] and health centers. Call your local health department for information about programs in your area.  Spiritual beliefs and  practices may help some smokers quit.  Download a "quit meter" on your computer to keep track of quit statistics, such as how long you have gone without smoking, cigarettes not smoked, and money saved.  Get a self-help book about quitting smoking and staying off of tobacco. LEARN NEW SKILLS AND BEHAVIORS  Distract yourself from urges to smoke. Talk to someone, go for a walk, or occupy your time with a task.  Change your normal routine. Take a different route to work. Drink tea instead of coffee. Eat breakfast in a different place.  Reduce your stress. Take a hot bath, exercise, or read a book.  Plan something enjoyable to do every day. Reward yourself for not smoking.  Explore interactive web-based programs that specialize in helping you quit. GET MEDICINE AND USE IT CORRECTLY Medicines can help you stop smoking and decrease the urge to smoke. Combining medicine with the above behavioral methods and support can greatly increase your chances of successfully quitting smoking.  Nicotine replacement therapy helps deliver nicotine to your body without the negative effects and risks of smoking. Nicotine replacement therapy includes nicotine gum, lozenges, inhalers, nasal sprays, and skin patches. Some may be available over-the-counter and others require a prescription.  Antidepressant medicine helps people abstain from smoking, but how this works is unknown. This medicine is available by prescription.  Nicotinic receptor partial agonist medicine simulates the effect of nicotine in your brain. This medicine is available by prescription. Ask your caregiver for advice about which medicines to use and how to use them based on your health history. Your caregiver will tell you what side effects to look out for if you choose to be on a medicine or therapy. Carefully read the information on the package. Do not use any other product containing nicotine while using a nicotine replacement product.  RELAPSE  OR DIFFICULT SITUATIONS Most relapses occur within the first 3 months after quitting. Do not be discouraged if you start smoking again. Remember, most people try several times before finally quitting. You may have symptoms of withdrawal because your body is used to nicotine. You may crave cigarettes, be irritable, feel very hungry, cough often, get headaches, or have difficulty concentrating. The  withdrawal symptoms are only temporary. They are strongest when you first quit, but they will go away within 10 14 days. To reduce the chances of relapse, try to:  Avoid drinking alcohol. Drinking lowers your chances of successfully quitting.  Reduce the amount of caffeine you consume. Once you quit smoking, the amount of caffeine in your body increases and can give you symptoms, such as a rapid heartbeat, sweating, and anxiety.  Avoid smokers because they can make you want to smoke.  Do not let weight gain distract you. Many smokers will gain weight when they quit, usually less than 10 pounds. Eat a healthy diet and stay active. You can always lose the weight gained after you quit.  Find ways to improve your mood other than smoking. FOR MORE INFORMATION  www.smokefree.gov  Document Released: 01/01/2001 Document Revised: 07/09/2011 Document Reviewed: 04/18/2011 [facility] Patient Information 2014 [place], [place].  Fall Prevention and Home Safety Falls cause injuries and can affect all age groups. It is possible to use preventive measures to significantly decrease the likelihood of falls. There are many simple measures which can make your home safer and prevent falls. OUTDOORS  Repair cracks and edges of walkways and driveways.  Remove high doorway thresholds.  Trim shrubbery on the main path into your home.  Have good outside lighting.  Clear walkways of tools, rocks, debris, and clutter.  Check that handrails are not broken and are securely fastened. Both sides of steps should have  handrails.  Have leaves, snow, and ice cleared regularly.  Use sand or salt on walkways during winter months.  In the garage, clean up grease or oil spills. BATHROOM  Install night lights.  Install grab bars by the toilet and in the tub and shower.  Use non-skid mats or decals in the tub or shower.  Place a plastic non-slip stool in the shower to sit on, if needed.  Keep floors dry and clean up all water on the floor immediately.  Remove soap buildup in the tub or shower on a regular basis.  Secure bath mats with non-slip, double-sided rug tape.  Remove throw rugs and tripping hazards from the floors. BEDROOMS  Install night lights.  Make sure a bedside light is easy to reach.  Do not use oversized bedding.  Keep a telephone by your bedside.  Have a firm chair with side arms to use for getting dressed.  Remove throw rugs and tripping hazards from the floor. KITCHEN  Keep handles on pots and pans turned toward the center of the stove. Use back burners when possible.  Clean up spills quickly and allow time for drying.  Avoid walking on wet floors.  Avoid hot utensils and knives.  Position shelves so they are not too high or low.  Place commonly used objects within easy reach.  If necessary, use a sturdy step stool with a grab bar when reaching.  Keep electrical cables out of the way.  Do not use floor polish or wax that makes floors slippery. If you must use wax, use non-skid floor wax.  Remove throw rugs and tripping hazards from the floor. STAIRWAYS  Never leave objects on stairs.  Place handrails on both sides of stairways and use them. Fix any loose handrails. Make sure handrails on both sides of the stairways are as long as the stairs.  Check carpeting to make sure it is firmly attached along stairs. Make repairs to worn or loose carpet promptly.  Avoid placing throw rugs at the top  or bottom of stairways, or properly secure the rug with carpet  tape to prevent slippage. Get rid of throw rugs, if possible.  Have an electrician put in a light switch at the top and bottom of the stairs. OTHER FALL PREVENTION TIPS  Wear low-heel or rubber-soled shoes that are supportive and fit well. Wear closed toe shoes.  When using a stepladder, make sure it is fully opened and both spreaders are firmly locked. Do not climb a closed stepladder.  Add color or contrast paint or tape to grab bars and handrails in your home. Place contrasting color strips on first and last steps.  Learn and use mobility aids as needed. Install an electrical emergency response system.  Turn on lights to avoid dark areas. Replace light bulbs that burn out immediately. Get light switches that glow.  Arrange furniture to create clear pathways. Keep furniture in the same place.  Firmly attach carpet with non-skid or double-sided tape.  Eliminate uneven floor surfaces.  Select a carpet pattern that does not visually hide the edge of steps.  Be aware of all pets. OTHER HOME SAFETY TIPS  Set the water temperature for 120 F (48.8 C).  Keep emergency numbers on or near the telephone.  Keep smoke detectors on every level of the home and near sleeping areas. Document Released: 12/28/2001 Document Revised: 07/09/2011 Document Reviewed: 03/29/2011 [facility] Patient Information 2014 [place], [place].  DASH Diet The DASH diet stands for "Dietary Approaches to Stop Hypertension." It is a healthy eating plan that has been shown to reduce high blood pressure (hypertension) in as little as 14 days, while also possibly providing other significant health benefits. These other health benefits include reducing the risk of breast cancer after menopause and reducing the risk of type 2 diabetes, heart disease, colon cancer, and stroke. Health benefits also include weight loss and slowing kidney failure in patients with chronic kidney disease.  DIET GUIDELINES  Limit salt (sodium).  Your diet should contain less than 1500 mg of sodium daily.  Limit refined or processed carbohydrates. Your diet should include mostly whole grains. Desserts and added sugars should be used sparingly.  Include small amounts of heart-healthy fats. These types of fats include nuts, oils, and tub margarine. Limit saturated and trans fats. These fats have been shown to be harmful in the body. CHOOSING FOODS  The following food groups are based on a 2000 calorie diet. See your Registered Dietitian for individual calorie needs. Grains and Grain Products (6 to 8 servings daily)  Eat More Often: Whole-wheat bread, brown rice, whole-grain or wheat pasta, quinoa, popcorn without added fat or salt (air popped).  Eat Less Often: White bread, white pasta, white rice, cornbread. Vegetables (4 to 5 servings daily)  Eat More Often: Fresh, frozen, and canned vegetables. Vegetables may be raw, steamed, roasted, or grilled with a minimal amount of fat.  Eat Less Often/Avoid: Creamed or fried vegetables. Vegetables in a cheese sauce. Fruit (4 to 5 servings daily)  Eat More Often: All fresh, canned (in natural juice), or frozen fruits. Dried fruits without added sugar. One hundred percent fruit juice ( cup [237 mL] daily).  Eat Less Often: Dried fruits with added sugar. Canned fruit in light or heavy syrup. [employer], Fish, and Poultry (2 servings or less daily. One serving is 3 to 4 oz [85-114 g]).  Eat More Often: Ninety percent or leaner ground beef, tenderloin, sirloin. Round cuts of beef, chicken breast, [place] breast. All fish. Grill, bake, or broil  your meat. Nothing should be fried.  Eat Less Often/Avoid: Fatty cuts of meat, [place], or chicken leg, thigh, or wing. Fried cuts of meat or fish. Dairy (2 to 3 servings)  Eat More Often: Low-fat or fat-free milk, low-fat plain or light yogurt, reduced-fat or part-skim cheese.  Eat Less Often/Avoid: Milk (whole, 2%).Whole milk yogurt. Full-fat  cheeses. Nuts, Seeds, and Legumes (4 to 5 servings per week)  Eat More Often: All without added salt.  Eat Less Often/Avoid: Salted nuts and seeds, canned beans with added salt. Fats and Sweets (limited)  Eat More Often: Vegetable oils, tub margarines without trans fats, sugar-free gelatin. Mayonnaise and salad dressings.  Eat Less Often/Avoid: Coconut oils, palm oils, butter, stick margarine, cream, half and half, cookies, candy, pie. FOR MORE INFORMATION The Dash Diet Eating Plan: www.dashdiet.org Document Released: 12/27/2010 Document Revised: 04/01/2011 Document Reviewed: 12/27/2010 [facility] Patient Information 2014 [place], [place].  Hypertension As your heart beats, it forces blood through your arteries. This force is your blood pressure. If the pressure is too high, it is called hypertension (HTN) or high blood pressure. HTN is dangerous because you may have it and not know it. High blood pressure may mean that your heart has to work harder to pump blood. Your arteries may be narrow or stiff. The extra work puts you at risk for heart disease, stroke, and other problems.  Blood pressure consists of two numbers, a higher number over a lower, 110/72, for example. It is stated as "110 over 72." The ideal is below 120 for the top number (systolic) and under 80 for the bottom (diastolic). Write down your blood pressure today. You should pay close attention to your blood pressure if you have certain conditions such as:  Heart failure.  Prior heart attack.  Diabetes  Chronic kidney disease.  Prior stroke.  Multiple risk factors for heart disease. To see if you have HTN, your blood pressure should be measured while you are seated with your arm held at the level of the heart. It should be measured at least twice. A one-time elevated blood pressure reading (especially in the Emergency Department) does not mean that you need treatment. There may be conditions in which the blood pressure is  different between your right and left arms. It is important to see your caregiver soon for a recheck. Most people have essential hypertension which means that there is not a specific cause. This type of high blood pressure may be lowered by changing lifestyle factors such as:  Stress.  Smoking.  Lack of exercise.  Excessive weight.  Drug/tobacco/alcohol use.  Eating less salt. Most people do not have symptoms from high blood pressure until it has caused damage to the body. Effective treatment can often prevent, delay or reduce that damage. TREATMENT  When a cause has been identified, treatment for high blood pressure is directed at the cause. There are a large number of medications to treat HTN. These fall into several categories, and your caregiver will help you select the medicines that are best for you. Medications may have side effects. You should review side effects with your caregiver. If your blood pressure stays high after you have made lifestyle changes or started on medicines,   Your medication(s) may need to be changed.  Other problems may need to be addressed.  Be certain you understand your prescriptions, and know how and when to take your medicine.  Be sure to follow up with your caregiver within the time frame advised (usually  within two weeks) to have your blood pressure rechecked and to review your medications.  If you are taking more than one medicine to lower your blood pressure, make sure you know how and at what times they should be taken. Taking two medicines at the same time can result in blood pressure that is too low. SEEK IMMEDIATE MEDICAL CARE IF:  You develop a severe headache, blurred or changing vision, or confusion.  You have unusual weakness or numbness, or a faint feeling.  You have severe chest or abdominal pain, vomiting, or breathing problems. MAKE SURE YOU:   Understand these instructions.  Will watch your condition.  Will get help right  away if you are not doing well or get worse. Document Released: 01/07/2005 Document Revised: 04/01/2011 Document Reviewed: 08/28/2007 [facility] Patient Information 2014 [place], [place].   Diabetes Meal Planning Guide The diabetes meal planning guide is a tool to help you plan your meals and snacks. It is important for people with diabetes to manage their blood glucose (sugar) levels. Choosing the right foods and the right amounts throughout your day will help control your blood glucose. Eating right can even help you improve your blood pressure and reach or maintain a healthy weight. CARBOHYDRATE COUNTING MADE EASY When you eat carbohydrates, they turn to sugar. This raises your blood glucose level. Counting carbohydrates can help you control this level so you feel better. When you plan your meals by counting carbohydrates, you can have more flexibility in what you eat and balance your medicine with your food intake. Carbohydrate counting simply means adding up the total amount of carbohydrate grams in your meals and snacks. Try to eat about the same amount at each meal. Foods with carbohydrates are listed below. Each portion below is 1 carbohydrate serving or 15 grams of carbohydrates. Ask your dietician how many grams of carbohydrates you should eat at each meal or snack. Grains and Starches  1 slice bread.   English muffin or hotdog/hamburger bun.   cup cold cereal (unsweetened).   cup cooked pasta or rice.   cup starchy vegetables (corn, potatoes, peas, beans, winter squash).  1 tortilla (6 inches).   bagel.  1 waffle or pancake (size of a CD).   cup cooked cereal.  4 to 6 small crackers. *Whole grain is recommended. Fruit  1 cup fresh unsweetened berries, melon, papaya, pineapple.  1 small fresh fruit.   banana or mango.   cup fruit juice (4 oz unsweetened).   cup canned fruit in natural juice or water.  2 tbs dried fruit.  12 to 15 grapes or cherries. Milk  and Yogurt  1 cup fat-free or 1% milk.  1 cup soy milk.  6 oz light yogurt with sugar-free sweetener.  6 oz low-fat soy yogurt.  6 oz plain yogurt. Vegetables  1 cup raw or  cup cooked is counted as 0 carbohydrates or a "free" food.  If you eat 3 or more servings at 1 meal, count them as 1 carbohydrate serving. Other Carbohydrates   oz chips or pretzels.   cup ice cream or frozen yogurt.   cup sherbet or sorbet.  2 inch square cake, no frosting.  1 tbs honey, sugar, jam, jelly, or syrup.  2 small cookies.  3 squares of graham crackers.  3 cups popcorn.  6 crackers.  1 cup broth-based soup.  Count 1 cup casserole or other mixed foods as 2 carbohydrate servings.  Foods with less than 20 calories in a serving  may be counted as 0 carbohydrates or a "free" food. You may want to purchase a book or computer software that lists the carbohydrate gram counts of different foods. In addition, the nutrition facts panel on the labels of the foods you eat are a good source of this information. The label will tell you how big the serving size is and the total number of carbohydrate grams you will be eating per serving. Divide this number by 15 to obtain the number of carbohydrate servings in a portion. Remember, 1 carbohydrate serving equals 15 grams of carbohydrate. SERVING SIZES Measuring foods and serving sizes helps you make sure you are getting the right amount of food. The list below tells how big or small some common serving sizes are.  1 oz.........4 stacked dice.  3 oz........Marland KitchenDeck of cards.  1 tsp.......Marland KitchenTip of little finger.  1 tbs......Marland KitchenMarland KitchenThumb.  2 tbs.......Marland KitchenGolf ball.   cup......Marland KitchenHalf of a fist.  1 cup.......Marland KitchenA fist. SAMPLE DIABETES MEAL PLAN Below is a sample meal plan that includes foods from the grain and starches, dairy, vegetable, fruit, and meat groups. A dietician can individualize a meal plan to fit your calorie needs and tell you the number of  servings needed from each food group. However, controlling the total amount of carbohydrates in your meal or snack is more important than making sure you include all of the food groups at every meal. You may interchange carbohydrate containing foods (dairy, starches, and fruits). The meal plan below is an example of a 2000 calorie diet using carbohydrate counting. This meal plan has 17 carbohydrate servings. Breakfast  1 cup oatmeal (2 carb servings).   cup light yogurt (1 carb serving).  1 cup blueberries (1 carb serving).   cup almonds. Snack  1 large apple (2 carb servings).  1 low-fat string cheese stick. Lunch  Chicken breast salad.  1 cup spinach.   cup chopped tomatoes.  2 oz chicken breast, sliced.  2 tbs low-fat [place] dressing.  12 whole-wheat crackers (2 carb servings).  12 to 15 grapes (1 carb serving).  1 cup low-fat milk (1 carb serving). Snack  1 cup carrots.   cup hummus (1 carb serving). Dinner  3 oz broiled salmon.  1 cup brown rice (3 carb servings). Snack  1  cups steamed broccoli (1 carb serving) drizzled with 1 tsp olive oil and lemon juice.  1 cup light pudding (2 carb servings). DIABETES MEAL PLANNING WORKSHEET Your dietician can use this worksheet to help you decide how many servings of foods and what types of foods are right for you.  BREAKFAST Food Group and Servings / Carb Servings Grain/Starches __________________________________ Dairy __________________________________________ Vegetable ______________________________________ Fruit ___________________________________________ Meat __________________________________________ Fat ____________________________________________ LUNCH Food Group and Servings / Carb Servings Grain/Starches ___________________________________ Dairy ___________________________________________ Fruit ____________________________________________ Meat ___________________________________________ Fat  _____________________________________________ Laural Golden Food Group and Servings / Carb Servings Grain/Starches ___________________________________ Dairy ___________________________________________ Fruit ____________________________________________ Meat ___________________________________________ Fat _____________________________________________ SNACKS Food Group and Servings / Carb Servings Grain/Starches ___________________________________ Dairy ___________________________________________ Vegetable _______________________________________ Fruit ____________________________________________ Meat ___________________________________________ Fat _____________________________________________ DAILY TOTALS Starches _________________________ Vegetable ________________________ Fruit ____________________________ Dairy ____________________________ Meat ____________________________ Fat ______________________________ Document Released: 10/04/2004 Document Revised: 04/01/2011 Document Reviewed: 08/15/2008 [employer] Patient Information 2014 [employer]  Type 2 Diabetes Mellitus, Adult Type 2 diabetes mellitus, often simply referred to as type 2 diabetes, is a long-lasting (chronic) disease. In type 2 diabetes, the pancreas does not make enough insulin (a hormone), the cells are less responsive to the insulin that is made (insulin resistance), or  both. Normally, insulin moves sugars from food into the tissue cells. The tissue cells use the sugars for energy. The lack of insulin or the lack of normal response to insulin causes excess sugars to build up in the blood instead of going into the tissue cells. As a result, high blood sugar (hyperglycemia) develops. The effect of high sugar (glucose) levels can cause many complications. Type 2 diabetes was also previously called adult-onset diabetes but it can occur at any age.  RISK FACTORS  A person is predisposed to developing type 2 diabetes if someone  in the family has the disease and also has one or more of the following primary risk factors:  Overweight.  An inactive lifestyle.  A history of consistently eating high-calorie foods. Maintaining a normal weight and regular physical activity can reduce the chance of developing type 2 diabetes. SYMPTOMS  A person with type 2 diabetes may not show symptoms initially. The symptoms of type 2 diabetes appear slowly. The symptoms include:  Increased thirst (polydipsia).  Increased urination (polyuria).  Increased urination during the night (nocturia).  Weight loss. This weight loss may be rapid.  Frequent, recurring infections.  Tiredness (fatigue).  Weakness.  Vision changes, such as blurred vision.  Fruity smell to your breath.  Abdominal pain.  Nausea or vomiting.  Cuts or bruises which are slow to heal.  Tingling or numbness in the hands or feet. DIAGNOSIS Type 2 diabetes is frequently not diagnosed until complications of diabetes are present. Type 2 diabetes is diagnosed when symptoms or complications are present and when blood glucose levels are increased. Your blood glucose level may be checked by one or more of the following blood tests:  A fasting blood glucose test. You will not be allowed to eat for at least 8 hours before a blood sample is taken.  A random blood glucose test. Your blood glucose is checked at any time of the day regardless of when you ate.  A hemoglobin A1c blood glucose test. A hemoglobin A1c test provides information about blood glucose control over the previous 3 months.  An oral glucose tolerance test (OGTT). Your blood glucose is measured after you have not eaten (fasted) for 2 hours and then after you drink a glucose-containing beverage. TREATMENT   You may need to take insulin or diabetes medicine daily to keep blood glucose levels in the desired range.  You will need to match insulin dosing with exercise and healthy food choices. The  treatment goal is to maintain the before meal blood sugar (preprandial glucose) level at 70 130 mg/dL. HOME CARE INSTRUCTIONS   Have your hemoglobin A1c level checked twice a year.  Perform daily blood glucose monitoring as directed by your caregiver.  Monitor urine ketones when you are ill and as directed by your caregiver.  Take your diabetes medicine or insulin as directed by your caregiver to maintain your blood glucose levels in the desired range.  Never run out of diabetes medicine or insulin. It is needed every day.  Adjust insulin based on your intake of carbohydrates. Carbohydrates can raise blood glucose levels but need to be included in your diet. Carbohydrates provide vitamins, minerals, and fiber which are an essential part of a healthy diet. Carbohydrates are found in fruits, vegetables, whole grains, dairy products, legumes, and foods containing added sugars.    Eat healthy foods. Alternate 3 meals with 3 snacks.  Lose weight if overweight.  Carry a medical alert card or wear your medical alert jewelry.  Carry a 15 gram carbohydrate snack with you at all times to treat low blood glucose (hypoglycemia). Some examples of 15 gram carbohydrate snacks include:  Glucose tablets, 3 or 4   Glucose gel, 15 gram tube  Raisins, 2 tablespoons (24 grams)  Jelly beans, 6  Animal crackers, 8  Regular pop, 4 ounces (120 mL)  Gummy treats, 9  Recognize hypoglycemia. Hypoglycemia occurs with blood glucose levels of 70 mg/dL and below. The risk for hypoglycemia increases when fasting or skipping meals, during or after intense exercise, and during sleep. Hypoglycemia symptoms can include:  Tremors or shakes.  Decreased ability to concentrate.  Sweating.  Increased heart rate.  Headache.  Dry mouth.  Hunger.  Irritability.  Anxiety.  Restless sleep.  Altered speech or coordination.  Confusion.  Treat hypoglycemia promptly. If you are alert and able to  safely swallow, follow the 15:15 rule:  Take 15 20 grams of rapid-acting glucose or carbohydrate. Rapid-acting options include glucose gel, glucose tablets, or 4 ounces (120 mL) of fruit juice, regular soda, or low fat milk.  Check your blood glucose level 15 minutes after taking the glucose.  Take 15 20 grams more of glucose if the repeat blood glucose level is still 70 mg/dL or below.  Eat a meal or snack within 1 hour once blood glucose levels return to normal.    Be alert to polyuria and polydipsia which are early signs of hyperglycemia. An early awareness of hyperglycemia allows for prompt treatment. Treat hyperglycemia as directed by your caregiver.  Engage in at least 150 minutes of moderate-intensity physical activity a week, spread over at least 3 days of the week or as directed by your caregiver. In addition, you should engage in resistance exercise at least 2 times a week or as directed by your caregiver.  Adjust your medicine and food intake as needed if you start a new exercise or sport.  Follow your sick day plan at any time you are unable to eat or drink as usual.  Avoid tobacco use.  Limit alcohol intake to no more than 1 drink per day for nonpregnant women and 2 drinks per day for men. You should drink alcohol only when you are also eating food. Talk with your caregiver whether alcohol is safe for you. Tell your caregiver if you drink alcohol several times a week.  Follow up with your caregiver regularly.  Schedule an eye exam soon after the diagnosis of type 2 diabetes and then annually.  Perform daily skin and foot care. Examine your skin and feet daily for cuts, bruises, redness, nail problems, bleeding, blisters, or sores. A foot exam by a caregiver should be done annually.  Brush your teeth and gums at least twice a day and floss at least once a day. Follow up with your dentist regularly.  Share your diabetes management plan with your workplace or  school.  Stay up-to-date with immunizations.  Learn to manage stress.  Obtain ongoing diabetes education and support as needed.  Participate in, or seek rehabilitation as needed to maintain or improve independence and quality of life. Request a physical or occupational therapy referral if you are having foot or hand numbness or difficulties with grooming, dressing, eating, or physical activity. SEEK MEDICAL CARE IF:   You are unable to eat food or drink fluids for more than 6 hours.  You have nausea and vomiting for more than 6 hours.  Your blood glucose level is over 240 mg/dL.  There is a  change in mental status.  You develop an additional serious illness.  You have diarrhea for more than 6 hours.  You have been sick or have had a fever for a couple of days and are not getting better.  You have pain during any physical activity.  SEEK IMMEDIATE MEDICAL CARE IF:  You have difficulty breathing.  You have moderate to large ketone levels. MAKE SURE YOU:  Understand these instructions.  Will watch your condition.  Will get help right away if you are not doing well or get worse. Document Released: 01/07/2005 Document Revised: 10/02/2011 Document Reviewed: 08/06/2011 [facility] Patient Information 2014 [place], [place].

## 2012-08-14 NOTE — Progress Notes (Addendum)
  Subjective:    Patient ID: [name], female    DOB: 1965/09/26, 47 y.o.   MRN: [id number]  HPI Comments: 46 y.o PMH left breast abscess s/p excision, HTN (BP 125/84), DM 2 (HA1C 10.3-->8.7 cbg is 409 this am patient ate oatmeal with brown sugar this am), HLD (TG 233 TC 317, LDL 214), tobacco abuse.  She presents after a mechanical fall 2 weeks ago while taking the trash out she states her left buttock was bruised and tender to palpation.  Now the bruising is gone away but she has a knot near the area of the bruise about the size of a plum.  The knot is not painful. She comes for evaluation of this.    SH: patient is pending orange card, recently married with 2 kids.       Review of Systems  Respiratory: Negative for shortness of breath.   Cardiovascular: Negative for chest pain.  Gastrointestinal: Negative for abdominal pain and blood in stool.  Genitourinary: Negative for hematuria.  Psychiatric/Behavioral: Positive for sleep disturbance. Negative for suicidal ideas.       H/o depression denies SI.  She has mood swings and is irritable she stopped taking Celexa       Objective:   Physical Exam  Nursing note and vitals reviewed. Constitutional: She is oriented to person, place, and time. Vital signs are normal. She appears well-developed and well-nourished. She is cooperative. No distress.  HENT:  Head: Normocephalic and atraumatic.  Mouth/Throat: Oropharynx is clear and moist and mucous membranes are normal. Abnormal dentition. No oropharyngeal exudate.  Eyes: Conjunctivae are normal. Pupils are equal, round, and reactive to light. Right eye exhibits no discharge. Left eye exhibits no discharge. No scleral icterus.  Cardiovascular: Normal rate, regular rhythm, S1 normal, S2 normal and normal heart sounds.   No murmur heard. Pulmonary/Chest: Effort normal and breath sounds normal. No respiratory distress. She has no wheezes.  Abdominal: Soft. Bowel sounds are normal. There  is no tenderness.  Obese ab  Genitourinary:     Neurological: She is alert and oriented to person, place, and time. Gait normal.  Skin: Skin is warm, dry and intact. No rash noted. She is not diaphoretic.  Psychiatric: She has a normal mood and affect. Her speech is normal and behavior is normal. Judgment and thought content normal. Cognition and memory are normal.          Assessment & Plan:  F/u 1 month left buttock hematoma and F/u 10/2012 DM, HTN, HLD check lipid panel

## 2012-08-14 NOTE — Progress Notes (Unsigned)
Ms. Cobos was referred to CSW.  CSW met with Ms. Ferencz briefly following her scheduled [facility] appt.  Pt unable to speak at this time due to scheduling conflict.  CSW provided Ms. Ledwith with business card and contact information.  CSW encouraged pt to contact CSW when convenient for patient.

## 2012-08-14 NOTE — Assessment & Plan Note (Signed)
Lipid Panel     Component Value Date/Time   CHOL 317* 01/07/2012 1014   TRIG 233* 01/07/2012 1014   HDL 56 01/07/2012 1014   CHOLHDL 5.7 01/07/2012 1014   VLDL 47* 01/07/2012 1014   LDLCALC 214* 01/07/2012 1014    Continue Pravastatin repeat lipid panel 10/2012

## 2012-08-15 ENCOUNTER — Encounter: Payer: Self-pay | Admitting: Internal Medicine

## 2012-09-17 ENCOUNTER — Encounter: Payer: Self-pay | Admitting: Internal Medicine

## 2012-09-23 ENCOUNTER — Ambulatory Visit: Payer: Self-pay | Admitting: Internal Medicine

## 2012-12-16 ENCOUNTER — Ambulatory Visit (INDEPENDENT_AMBULATORY_CARE_PROVIDER_SITE_OTHER): Payer: Self-pay | Admitting: Internal Medicine

## 2012-12-16 ENCOUNTER — Encounter: Payer: Self-pay | Admitting: Internal Medicine

## 2012-12-16 ENCOUNTER — Telehealth: Payer: Self-pay | Admitting: *Deleted

## 2012-12-16 VITALS — BP 131/86 | HR 88 | Temp 97.4°F | Ht 64.0 in | Wt 163.3 lb

## 2012-12-16 DIAGNOSIS — E119 Type 2 diabetes mellitus without complications: Secondary | ICD-10-CM

## 2012-12-16 DIAGNOSIS — F329 Major depressive disorder, single episode, unspecified: Secondary | ICD-10-CM

## 2012-12-16 DIAGNOSIS — Z23 Encounter for immunization: Secondary | ICD-10-CM

## 2012-12-16 DIAGNOSIS — I10 Essential (primary) hypertension: Secondary | ICD-10-CM

## 2012-12-16 NOTE — Assessment & Plan Note (Signed)
Lab Results  Component Value Date   HGBA1C 9.9 12/16/2012   HGBA1C 8.7 08/14/2012   HGBA1C 10.3 05/19/2012     Assessment: Diabetes control: poor control (HgbA1C >9%) Progress toward A1C goal:  deteriorated Comments:   Plan: Medications:  patient has been off all medications and will resume metformin with gradual titration, at next visit can add additional agent if needed Home glucose monitoring: Frequency: no home glucose monitoring Timing: N/A Instruction/counseling given: reminded to get eye exam, discussed foot care and discussed the need for weight loss Educational resources provided:   Self management tools provided: instructions for home glucose monitoring Other plans: HgA1c, pneumonia shot done

## 2012-12-16 NOTE — Assessment & Plan Note (Signed)
Sounds as though the patient has multiple stressors and she has good reason for mood issues. She does not wish to take celexa as she is scared of drug interactions. Reassured that she could try it but doubt that she will. She would be a good candidate for counseling.

## 2012-12-16 NOTE — Patient Instructions (Signed)
We will have you not take metformin tonight. Starting tomorrow take 1 pill in the morning only for 3 days. Then after those 3 days take 1 pill in the morning and 1 pill at night for 3 more days. After this take 2 pills in the morning and 1 pill at night for 3 days. Then take 2 pills in the morning and 2 pills at night.  If at any time the diarrhea is getting worse please call us for advice at [phone].  We will see you back in 2-3 months to check on your sugars. If you have to stop the metformin for some reason in the future you can restart it slowly like this.   Diet for Diarrhea, Adult Frequent, runny stools (diarrhea) may be caused or worsened by food or drink. Diarrhea may be relieved by changing your diet. Since diarrhea can last up to 7 days, it is easy for you to lose too much fluid from the body and become dehydrated. Fluids that are lost need to be replaced. Along with a modified diet, make sure you drink enough fluids to keep your urine clear or pale yellow. DIET INSTRUCTIONS  Ensure adequate fluid intake (hydration): have 1 cup (8 oz) of fluid for each diarrhea episode. Avoid fluids that contain simple sugars or sports drinks, fruit juices, whole milk products, and sodas. Your urine should be clear or pale yellow if you are drinking enough fluids. Hydrate with an oral rehydration solution that you can purchase at pharmacies, retail stores, and online. You can prepare an oral rehydration solution at home by mixing the following ingredients together:    tsp table salt.   tsp baking soda.   tsp salt substitute containing potassium chloride.  1  tablespoons sugar.  1 L (34 oz) of water.  Certain foods and beverages may increase the speed at which food moves through the gastrointestinal (GI) tract. These foods and beverages should be avoided and include:  Caffeinated and alcoholic beverages.  High-fiber foods, such as raw fruits and vegetables, nuts, seeds, and whole grain breads  and cereals.  Foods and beverages sweetened with sugar alcohols, such as xylitol, sorbitol, and mannitol.  Some foods may be well tolerated and may help thicken stool including:  Starchy foods, such as rice, toast, pasta, low-sugar cereal, oatmeal, grits, baked potatoes, crackers, and bagels.   Bananas.   Applesauce.  Add probiotic-rich foods to help increase healthy bacteria in the GI tract, such as yogurt and fermented milk products. RECOMMENDED FOODS AND BEVERAGES Starches Choose foods with less than 2 g of fiber per serving.  Recommended:  White, [place], and pita breads, plain rolls, buns, bagels. Plain muffins, matzo. Soda, saltine, or graham crackers. Pretzels, melba toast, zwieback. Cooked cereals made with water: cornmeal, farina, cream cereals. Dry cereals: refined corn, wheat, rice. Potatoes prepared any way without skins, refined macaroni, spaghetti, noodles, refined rice.  Avoid:  Bread, rolls, or crackers made with whole wheat, multi-grains, rye, bran seeds, nuts, or coconut. Corn tortillas or taco shells. Cereals containing whole grains, multi-grains, bran, coconut, nuts, raisins. Cooked or dry oatmeal. Coarse wheat cereals, granola. Cereals advertised as "high-fiber." Potato skins. Whole grain pasta, wild or brown rice. Popcorn. Sweet potatoes, yams. Sweet rolls, doughnuts, waffles, pancakes, sweet breads. Vegetables  Recommended: Strained tomato and vegetable juices. Most well-cooked and canned vegetables without seeds. Fresh: Tender lettuce, cucumber without the skin, cabbage, spinach, bean sprouts.  Avoid: Fresh, cooked, or canned: Artichokes, baked beans, beet greens, broccoli, Brussels sprouts, corn,  kale, legumes, peas, sweet potatoes. Cooked: Green or red cabbage, spinach. Avoid large servings of any vegetables because vegetables shrink when cooked, and they contain more fiber per serving than fresh vegetables. Fruit  Recommended: Cooked or canned: Apricots,  applesauce, cantaloupe, cherries, fruit cocktail, grapefruit, grapes, kiwi, mandarin oranges, peaches, pears, plums, watermelon. Fresh: Apples without skin, ripe banana, grapes, cantaloupe, cherries, grapefruit, peaches, oranges, plums. Keep servings limited to  cup or 1 piece.  Avoid: Fresh: Apples with skin, apricots, mangoes, pears, raspberries, strawberries. Prune juice, stewed or dried prunes. Dried fruits, raisins, dates. Large servings of all fresh fruits. Protein  Recommended: Ground or well-cooked tender beef, ham, veal, lamb, pork, or poultry. Eggs. Fish, oysters, shrimp, lobster, other seafoods. Liver, organ meats.  Avoid: Tough, fibrous meats with gristle. Peanut butter, smooth or chunky. Cheese, nuts, seeds, legumes, dried peas, beans, lentils. Dairy  Recommended: Yogurt, lactose-free milk, kefir, drinkable yogurt, buttermilk, soy milk, or plain hard cheese.  Avoid: Milk, chocolate milk, beverages made with milk, such as milkshakes. Soups  Recommended: Bouillon, broth, or soups made from allowed foods. Any strained soup.  Avoid: Soups made from vegetables that are not allowed, cream or milk-based soups. Desserts and Sweets  Recommended: Sugar-free gelatin, sugar-free frozen ice pops made without sugar alcohol.  Avoid: Plain cakes and cookies, pie made with fruit, pudding, custard, cream pie. Gelatin, fruit, ice, sherbet, frozen ice pops. Ice cream, ice milk without nuts. Plain hard candy, honey, jelly, molasses, syrup, sugar, chocolate syrup, gumdrops, marshmallows. Fats and Oils  Recommended: Limit fats to less than 8 tsp per day.  Avoid: Seeds, nuts, olives, avocados. Margarine, butter, cream, mayonnaise, salad oils, plain salad dressings. Plain gravy, crisp bacon without rind. Beverages  Recommended: Water, decaffeinated teas, oral rehydration solutions, sugar-free beverages not sweetened with sugar alcohols.  Avoid: Fruit juices, caffeinated beverages (coffee, tea,  soda), alcohol, sports drinks, or lemon-lime soda. Condiments  Recommended: Ketchup, mustard, horseradish, vinegar, cocoa powder. Spices in moderation: allspice, basil, bay leaves, celery powder or leaves, cinnamon, cumin powder, curry powder, ginger, mace, marjoram, onion or garlic powder, oregano, paprika, parsley flakes, ground pepper, rosemary, sage, savory, tarragon, thyme, turmeric.  Avoid: Coconut, honey. Document Released: 03/30/2003 Document Revised: 10/02/2011 Document Reviewed: 05/24/2011 [facility] Patient Information 2014 [place], [place].

## 2012-12-16 NOTE — Progress Notes (Signed)
Subjective:     Patient ID: [name], female   DOB: [birth year], 47 y.o.   MRN: [id number]  HPI The patient is a 47 YO female who is coming in today for an acute visit for diarrhea. She also has history of HTN, HPL, breast abscess and type II DM. She states that there is a lot of stress in her family right now due to her 103 year old daughter terrorizing the neighborhood and family. She is in intensive counseling right now but is on the verge of being sent to boot camp or group home or juvenile detention. Her husband is also very stressed and he is considering leaving the family due to the stressors. She has lost her job due to her daughter and this has put a lot of strain on her. She was unable to get her medicines for a while but is now taking them since Sunday. On Monday she noticed diarrhea. She just feels as though everything is making her go when she eats. She is going about 3-4 times per day. She is able to keep down food and is not nauseous. She is not febrile, no chills, no one else around her is sick.   Review of Systems  Constitutional: Negative for fever, chills, diaphoresis, activity change, appetite change, fatigue and unexpected weight change.  HENT: Negative.   Respiratory: Negative for cough, chest tightness, shortness of breath and wheezing.   Cardiovascular: Negative for chest pain, palpitations and leg swelling.  Gastrointestinal: Positive for diarrhea. Negative for nausea, vomiting, abdominal pain, constipation, blood in stool, abdominal distention, anal bleeding and rectal pain.  Endocrine: Negative.   Genitourinary: Negative.   Musculoskeletal: Negative.   Skin: Negative.   Neurological: Negative for dizziness, tremors, seizures, syncope, facial asymmetry, speech difficulty, weakness, light-headedness, numbness and headaches.  Psychiatric/Behavioral: Positive for sleep disturbance and dysphoric mood. Negative for suicidal ideas, hallucinations, behavioral problems,  confusion, self-injury, decreased concentration and agitation. The patient is not nervous/anxious and is not hyperactive.        Objective:   Physical Exam  Constitutional: She is oriented to person, place, and time. She appears well-developed and well-nourished. No distress.  Obese  HENT:  Head: Normocephalic and atraumatic.  Eyes: EOM are normal. Pupils are equal, round, and reactive to light.  Neck: Normal range of motion. Neck supple.  Cardiovascular: Normal rate and regular rhythm.   No murmur heard. Pulmonary/Chest: Effort normal and breath sounds normal. No respiratory distress. She has no wheezes. She has no rales.  Abdominal: Soft. Bowel sounds are normal. She exhibits no distension. There is no tenderness. There is no rebound.  Musculoskeletal: Normal range of motion. She exhibits no edema and no tenderness.  Neurological: She is alert and oriented to person, place, and time. No cranial nerve deficit.  Skin: Skin is warm and dry. She is not diaphoretic.       Assessment/Plan:   1. Diarrhea - Likely medication side effect from too high a dose of metformin. Will hold for 1 day then restart with titration gradually. She had previously been tolerating metformin fine. Advised to drink fluids. Not likely infectious.   2. Please see problem oriented charting.   3. Disposition - Patient will get Flu and Pneumonia shot today. She will hold metformin for a day or so and then start back with taper. Patient will call if diarrhea gets worse or does not improve. HgA1c up due to no medicines and she was encouraged to continue taking metformin now and  to work on exercise. She will return in about 2 months for sugar and blood pressure check. She is going to get an eye exam and glasses for christmas.

## 2012-12-16 NOTE — Assessment & Plan Note (Signed)
BP Readings from Last 3 Encounters:  12/16/12 131/86  08/14/12 125/84  07/07/12 146/92    Lab Results  Component Value Date   NA 135 02/04/2012   K 4.7 02/04/2012   CREATININE 0.57 02/04/2012    Assessment: Blood pressure control: controlled Progress toward BP goal:  at goal Comments:   Plan: Medications:  continue current medications Educational resources provided:   Self management tools provided: instructions for home blood pressure monitoring Other plans:

## 2012-12-16 NOTE — Telephone Encounter (Signed)
Pt called stating she has had " the Runs" for 3 days.  She is having 4 BM's a day. Also c/o nausea.  She is drinking lots of water. Pt was advised to call MD from pharmacy.  Pt offered appointment today but she has other plans.  She will go to [facility] after her appointment.

## 2012-12-16 NOTE — Progress Notes (Signed)
Case discussed with Dr. Kollar at the time of the visit.  We reviewed the resident's history and exam and pertinent patient test results.  I agree with the assessment, diagnosis, and plan of care documented in the resident's note.     

## 2013-01-29 ENCOUNTER — Other Ambulatory Visit: Payer: Self-pay | Admitting: Internal Medicine

## 2013-03-22 ENCOUNTER — Encounter (HOSPITAL_COMMUNITY): Payer: Self-pay | Admitting: *Deleted

## 2013-03-22 ENCOUNTER — Encounter (HOSPITAL_COMMUNITY): Payer: Self-pay | Admitting: Emergency Medicine

## 2013-03-22 ENCOUNTER — Inpatient Hospital Stay (HOSPITAL_COMMUNITY)
Admission: AD | Admit: 2013-03-22 | Discharge: 2013-03-24 | DRG: 897 | Disposition: A | Discharge status: Home / Self Care | Payer: Federal, State, Local not specified - Other | Source: Intra-hospital | Attending: Psychiatry | Admitting: Psychiatry

## 2013-03-22 ENCOUNTER — Emergency Department (HOSPITAL_COMMUNITY)
Admission: EM | Admit: 2013-03-22 | Discharge: 2013-03-22 | Disposition: A | Discharge status: Other Acute Inpatient Hospital | Payer: Self-pay | Attending: Emergency Medicine | Admitting: Emergency Medicine

## 2013-03-22 DIAGNOSIS — Z8611 Personal history of tuberculosis: Secondary | ICD-10-CM

## 2013-03-22 DIAGNOSIS — E119 Type 2 diabetes mellitus without complications: Secondary | ICD-10-CM | POA: Insufficient documentation

## 2013-03-22 DIAGNOSIS — I10 Essential (primary) hypertension: Secondary | ICD-10-CM | POA: Insufficient documentation

## 2013-03-22 DIAGNOSIS — F1994 Other psychoactive substance use, unspecified with psychoactive substance-induced mood disorder: Secondary | ICD-10-CM | POA: Diagnosis present

## 2013-03-22 DIAGNOSIS — Z8742 Personal history of other diseases of the female genital tract: Secondary | ICD-10-CM | POA: Insufficient documentation

## 2013-03-22 DIAGNOSIS — F17200 Nicotine dependence, unspecified, uncomplicated: Secondary | ICD-10-CM | POA: Insufficient documentation

## 2013-03-22 DIAGNOSIS — F410 Panic disorder [episodic paroxysmal anxiety] without agoraphobia: Secondary | ICD-10-CM | POA: Diagnosis present

## 2013-03-22 DIAGNOSIS — F411 Generalized anxiety disorder: Secondary | ICD-10-CM | POA: Diagnosis present

## 2013-03-22 DIAGNOSIS — F1020 Alcohol dependence, uncomplicated: Principal | ICD-10-CM | POA: Diagnosis present

## 2013-03-22 DIAGNOSIS — Z8619 Personal history of other infectious and parasitic diseases: Secondary | ICD-10-CM | POA: Insufficient documentation

## 2013-03-22 DIAGNOSIS — F4310 Post-traumatic stress disorder, unspecified: Secondary | ICD-10-CM | POA: Diagnosis present

## 2013-03-22 DIAGNOSIS — F32A Depression, unspecified: Secondary | ICD-10-CM

## 2013-03-22 DIAGNOSIS — F329 Major depressive disorder, single episode, unspecified: Secondary | ICD-10-CM | POA: Diagnosis present

## 2013-03-22 DIAGNOSIS — E7800 Pure hypercholesterolemia, unspecified: Secondary | ICD-10-CM | POA: Diagnosis present

## 2013-03-22 DIAGNOSIS — Z862 Personal history of diseases of the blood and blood-forming organs and certain disorders involving the immune mechanism: Secondary | ICD-10-CM | POA: Insufficient documentation

## 2013-03-22 DIAGNOSIS — E860 Dehydration: Secondary | ICD-10-CM | POA: Insufficient documentation

## 2013-03-22 DIAGNOSIS — F1010 Alcohol abuse, uncomplicated: Secondary | ICD-10-CM | POA: Insufficient documentation

## 2013-03-22 DIAGNOSIS — E785 Hyperlipidemia, unspecified: Secondary | ICD-10-CM

## 2013-03-22 DIAGNOSIS — Z8709 Personal history of other diseases of the respiratory system: Secondary | ICD-10-CM | POA: Insufficient documentation

## 2013-03-22 HISTORY — DX: Essential (primary) hypertension: I10

## 2013-03-22 HISTORY — DX: Pure hypercholesterolemia, unspecified: E78.00

## 2013-03-22 HISTORY — DX: Type 2 diabetes mellitus without complications: E11.9

## 2013-03-22 LAB — URINALYSIS, ROUTINE W REFLEX MICROSCOPIC
Bilirubin Urine: NEGATIVE
GLUCOSE, UA: 500 mg/dL — AB
HGB URINE DIPSTICK: NEGATIVE
Ketones, ur: NEGATIVE mg/dL
Nitrite: NEGATIVE
Protein, ur: NEGATIVE mg/dL
SPECIFIC GRAVITY, URINE: 1.013 (ref 1.005–1.030)
Urobilinogen, UA: 0.2 mg/dL (ref 0.0–1.0)
pH: 5 (ref 5.0–8.0)

## 2013-03-22 LAB — SALICYLATE LEVEL: Salicylate Lvl: <2 mg/dL — ABNORMAL LOW (ref 2.8–20.0)

## 2013-03-22 LAB — CBC
HCT: 41.6 % (ref 36.0–46.0)
Hemoglobin: 14.8 g/dL (ref 12.0–15.0)
MCH: 29.2 pg (ref 26.0–34.0)
MCHC: 35.6 g/dL (ref 30.0–36.0)
MCV: 82.2 fL (ref 78.0–100.0)
PLATELETS: 170 10*3/uL (ref 150–400)
RBC: 5.06 MIL/uL (ref 3.87–5.11)
RDW: 12.9 % (ref 11.5–15.5)
WBC: 6.6 10*3/uL (ref 4.0–10.5)

## 2013-03-22 LAB — COMPREHENSIVE METABOLIC PANEL
ALBUMIN: 3.9 g/dL (ref 3.5–5.2)
ALK PHOS: 175 U/L — AB (ref 39–117)
ALT: 166 U/L — AB (ref 0–35)
AST: 141 U/L — AB (ref 0–37)
BUN: 5 mg/dL — ABNORMAL LOW (ref 6–23)
CALCIUM: 9.7 mg/dL (ref 8.4–10.5)
CO2: 22 mEq/L (ref 19–32)
Chloride: 89 mEq/L — ABNORMAL LOW (ref 96–112)
Creatinine, Ser: 0.5 mg/dL (ref 0.50–1.10)
GFR calc non Af Amer: >90 mL/min (ref >90)
GLUCOSE: 238 mg/dL — AB (ref 70–99)
POTASSIUM: 3.8 meq/L (ref 3.7–5.3)
SODIUM: 128 meq/L — AB (ref 137–147)
TOTAL PROTEIN: 8.6 g/dL — AB (ref 6.0–8.3)
Total Bilirubin: 0.8 mg/dL (ref 0.3–1.2)

## 2013-03-22 LAB — RAPID URINE DRUG SCREEN, HOSP PERFORMED
Amphetamines: NOT DETECTED
BENZODIAZEPINES: NOT DETECTED
Barbiturates: NOT DETECTED
COCAINE: NOT DETECTED
OPIATES: NOT DETECTED
TETRAHYDROCANNABINOL: NOT DETECTED

## 2013-03-22 LAB — ACETAMINOPHEN LEVEL

## 2013-03-22 LAB — CBG MONITORING, ED: GLUCOSE-CAPILLARY: 244 mg/dL — AB (ref 70–99)

## 2013-03-22 LAB — GLUCOSE, CAPILLARY: GLUCOSE-CAPILLARY: 158 mg/dL — AB (ref 70–99)

## 2013-03-22 LAB — URINE MICROSCOPIC-ADD ON

## 2013-03-22 LAB — ETHANOL: Alcohol, Ethyl (B): <11 mg/dL (ref 0–11)

## 2013-03-22 MED ORDER — SIMVASTATIN 20 MG PO TABS
20.0000 mg | ORAL_TABLET | Freq: Every day | ORAL | Status: DC
  Administered 2013-03-23: 20 mg via ORAL
  Filled 2013-03-22: qty 14
  Filled 2013-03-22 (×3): qty 1

## 2013-03-22 MED ORDER — NICOTINE 21 MG/24HR TD PT24: 21.0000 mg | MEDICATED_PATCH | Freq: Every day | TRANSDERMAL | Status: DC

## 2013-03-22 MED ORDER — TRAZODONE HCL 50 MG PO TABS
50.0000 mg | ORAL_TABLET | Freq: Every evening | ORAL | Status: DC | PRN
  Administered 2013-03-22 – 2013-03-23 (×3): 50 mg via ORAL
  Filled 2013-03-22 (×4): qty 1
  Filled 2013-03-22: qty 28
  Filled 2013-03-22 (×5): qty 1
  Filled 2013-03-22: qty 28
  Filled 2013-03-22: qty 1

## 2013-03-22 MED ORDER — SODIUM CHLORIDE 0.9 % IV BOLUS (SEPSIS)
1000.0000 mL | Freq: Once | INTRAVENOUS | Status: AC
  Administered 2013-03-22: 1000 mL via INTRAVENOUS

## 2013-03-22 MED ORDER — INSULIN ASPART 100 UNIT/ML ~~LOC~~ SOLN
0.0000 [IU] | Freq: Three times a day (TID) | SUBCUTANEOUS | Status: DC
  Administered 2013-03-23: 2 [IU] via SUBCUTANEOUS
  Administered 2013-03-23: 5 [IU] via SUBCUTANEOUS
  Administered 2013-03-24: 3 [IU] via SUBCUTANEOUS
  Administered 2013-03-24: 2 [IU] via SUBCUTANEOUS

## 2013-03-22 MED ORDER — LORAZEPAM 1 MG PO TABS: 0.0000 mg | ORAL_TABLET | Freq: Two times a day (BID) | ORAL | Status: DC

## 2013-03-22 MED ORDER — LISINOPRIL-HYDROCHLOROTHIAZIDE 10-12.5 MG PO TABS: 1.0000 | ORAL_TABLET | Freq: Every day | ORAL | Status: DC

## 2013-03-22 MED ORDER — CHLORDIAZEPOXIDE HCL 25 MG PO CAPS
25.0000 mg | ORAL_CAPSULE | Freq: Four times a day (QID) | ORAL | Status: AC
  Administered 2013-03-22 – 2013-03-23 (×5): 25 mg via ORAL
  Filled 2013-03-22 (×5): qty 1

## 2013-03-22 MED ORDER — THIAMINE HCL 100 MG/ML IJ SOLN: 100.0000 mg | Freq: Every day | INTRAMUSCULAR | Status: DC

## 2013-03-22 MED ORDER — CHLORDIAZEPOXIDE HCL 25 MG PO CAPS: 25.0000 mg | ORAL_CAPSULE | Freq: Every day | ORAL | Status: DC

## 2013-03-22 MED ORDER — IBUPROFEN 400 MG PO TABS: 600.0000 mg | ORAL_TABLET | Freq: Three times a day (TID) | ORAL | Status: DC | PRN

## 2013-03-22 MED ORDER — HYDROCHLOROTHIAZIDE 12.5 MG PO CAPS
12.5000 mg | ORAL_CAPSULE | Freq: Every day | ORAL | Status: DC
  Administered 2013-03-23 – 2013-03-24 (×2): 12.5 mg via ORAL
  Filled 2013-03-22 (×5): qty 1

## 2013-03-22 MED ORDER — CHLORDIAZEPOXIDE HCL 25 MG PO CAPS: 25.0000 mg | ORAL_CAPSULE | ORAL | Status: DC

## 2013-03-22 MED ORDER — ALUM & MAG HYDROXIDE-SIMETH 200-200-20 MG/5ML PO SUSP: 30.0000 mL | ORAL | Status: DC | PRN

## 2013-03-22 MED ORDER — CHLORDIAZEPOXIDE HCL 25 MG PO CAPS: 25.0000 mg | ORAL_CAPSULE | Freq: Four times a day (QID) | ORAL | Status: DC | PRN

## 2013-03-22 MED ORDER — LORAZEPAM 1 MG PO TABS: 1.0000 mg | ORAL_TABLET | Freq: Three times a day (TID) | ORAL | Status: DC | PRN

## 2013-03-22 MED ORDER — CHLORDIAZEPOXIDE HCL 25 MG PO CAPS
25.0000 mg | ORAL_CAPSULE | Freq: Three times a day (TID) | ORAL | Status: DC
Start: 2013-03-24 — End: 2013-03-24
  Administered 2013-03-24 (×2): 25 mg via ORAL
  Filled 2013-03-22 (×2): qty 1

## 2013-03-22 MED ORDER — ZOLPIDEM TARTRATE 5 MG PO TABS: 5.0000 mg | ORAL_TABLET | Freq: Every evening | ORAL | Status: DC | PRN

## 2013-03-22 MED ORDER — ADULT MULTIVITAMIN W/MINERALS CH
1.0000 | ORAL_TABLET | Freq: Every day | ORAL | Status: DC
  Administered 2013-03-23 – 2013-03-24 (×2): 1 via ORAL
  Filled 2013-03-22 (×4): qty 1

## 2013-03-22 MED ORDER — METFORMIN HCL 500 MG PO TABS
1000.0000 mg | ORAL_TABLET | Freq: Two times a day (BID) | ORAL | Status: DC
  Administered 2013-03-23 – 2013-03-24 (×3): 1000 mg via ORAL
  Filled 2013-03-22: qty 56
  Filled 2013-03-22 (×6): qty 2
  Filled 2013-03-22: qty 56
  Filled 2013-03-22 (×2): qty 2

## 2013-03-22 MED ORDER — INSULIN ASPART 100 UNIT/ML ~~LOC~~ SOLN: 0.0000 [IU] | Freq: Every day | SUBCUTANEOUS | Status: DC

## 2013-03-22 MED ORDER — ONDANSETRON HCL 4 MG PO TABS
4.0000 mg | ORAL_TABLET | Freq: Three times a day (TID) | ORAL | Status: DC | PRN
Start: 2013-03-22 — End: 2013-03-22

## 2013-03-22 MED ORDER — HYDROXYZINE HCL 25 MG PO TABS: 25.0000 mg | ORAL_TABLET | Freq: Four times a day (QID) | ORAL | Status: DC | PRN

## 2013-03-22 MED ORDER — LORAZEPAM 1 MG PO TABS
0.0000 mg | ORAL_TABLET | Freq: Four times a day (QID) | ORAL | Status: DC
  Administered 2013-03-22: 1 mg via ORAL
  Filled 2013-03-22: qty 1

## 2013-03-22 MED ORDER — VITAMIN B-1 100 MG PO TABS
100.0000 mg | ORAL_TABLET | Freq: Every day | ORAL | Status: DC
  Administered 2013-03-22: 100 mg via ORAL
  Filled 2013-03-22: qty 1

## 2013-03-22 MED ORDER — LISINOPRIL 10 MG PO TABS
10.0000 mg | ORAL_TABLET | Freq: Every day | ORAL | Status: DC
  Administered 2013-03-23 – 2013-03-24 (×2): 10 mg via ORAL
  Filled 2013-03-22: qty 14
  Filled 2013-03-22 (×4): qty 1

## 2013-03-22 MED ORDER — METFORMIN HCL 500 MG PO TABS
1000.0000 mg | ORAL_TABLET | Freq: Two times a day (BID) | ORAL | Status: DC
  Administered 2013-03-22: 1000 mg via ORAL
  Filled 2013-03-22: qty 2

## 2013-03-22 MED ORDER — MAGNESIUM HYDROXIDE 400 MG/5ML PO SUSP: 30.0000 mL | Freq: Every day | ORAL | Status: DC | PRN

## 2013-03-22 MED ORDER — SODIUM CHLORIDE 0.9 % IV BOLUS (SEPSIS): 1000.0000 mL | Freq: Once | INTRAVENOUS | Status: DC

## 2013-03-22 MED ORDER — ACETAMINOPHEN 325 MG PO TABS: 650.0000 mg | ORAL_TABLET | Freq: Four times a day (QID) | ORAL | Status: DC | PRN

## 2013-03-22 MED ORDER — THIAMINE HCL 100 MG/ML IJ SOLN
100.0000 mg | Freq: Once | INTRAMUSCULAR | Status: AC
  Administered 2013-03-22: 100 mg via INTRAMUSCULAR
  Filled 2013-03-22: qty 2

## 2013-03-22 MED ORDER — ONDANSETRON 4 MG PO TBDP
4.0000 mg | ORAL_TABLET | Freq: Four times a day (QID) | ORAL | Status: DC | PRN
Start: 2013-03-22 — End: 2013-03-24

## 2013-03-22 MED ORDER — LOPERAMIDE HCL 2 MG PO CAPS: 2.0000 mg | ORAL_CAPSULE | ORAL | Status: DC | PRN

## 2013-03-22 MED ORDER — VITAMIN B-1 100 MG PO TABS
100.0000 mg | ORAL_TABLET | Freq: Every day | ORAL | Status: DC
  Administered 2013-03-23 – 2013-03-24 (×2): 100 mg via ORAL
  Filled 2013-03-22 (×4): qty 1

## 2013-03-22 MED ORDER — SIMVASTATIN 20 MG PO TABS: 20.0000 mg | ORAL_TABLET | Freq: Every day | ORAL | Status: DC

## 2013-03-22 NOTE — ED Notes (Signed)
SPOKE WITH RN JESSICA REQUESTING TO MOVE PT TO POD C. REQUESTED THAT PT BP BE READDRESSED SINCE HAS NOT BEEN TAKEN SINCE ARRIVAL AND BP TO BE ADDRESSED WITH MD.

## 2013-03-22 NOTE — BH Assessment (Signed)
[facility] by Renata Capriceonrad, NP to Dr. Dub MikesLugo. The room assignment is 302-1. Patient's nurse-Annette made aware.

## 2013-03-22 NOTE — ED Notes (Signed)
[facility] here to transport pt  

## 2013-03-22 NOTE — ED Notes (Signed)
Pt here complaining that she "doesn't feel good".  She does not have a glucometer and she would like her sugars checked.  She also would like help getting into an alcohol detox program.  States denies any etoh today, but last drank 6 12 oz beers yesterday.

## 2013-03-22 NOTE — ED Notes (Signed)
[facility] been called to transport pt

## 2013-03-22 NOTE — ED Provider Notes (Signed)
CSN: [id number]     Arrival date & time 03/22/13  1111 History   First MD Initiated Contact with Patient 03/22/13 1216     Chief Complaint  Patient presents with  . Alcohol Problem     (Consider location/radiation/quality/duration/timing/severity/associated sxs/prior Treatment) The history is provided by the patient.   Patient presents requesting alcohol detox.  States she usually drinks 6 beers per day.  Last did detox 5 years ago.  Last drink was yesterday.  She is anxious and depressed.  Denies SI, HI.   Also states that this morning she felt lightheaded.  States she is not eating due to extreme anxiety.  Has had vomiting, but this is a daily occurrence she thinks is due to her alcohol abuse.    Denies headache, CP, SOB, abdominal pain, urinary or bowel symptoms.   Past Medical History  Diagnosis Date  . Tuberculosis     was treated  . Gonorrhea   . Chlamydia   . Breast abscess   . Substance abuse     alcohol  . Recurrent upper respiratory infection (URI)     URI/SOB-treated with antibiotics-cleared up now  . Depression     PTSD also  . Anemia     when in late teens  . Diabetes mellitus without complication   . Hypertension   . Hypercholesteremia    Past Surgical History  Procedure Laterality Date  . Tongue surgery  2008    lump removed  . Incise and drain abcess  01/03/11    breast abscess   Family History  Problem Relation Age of Onset  . Cancer Mother     breast  . Cancer Sister     breast  . Cancer Paternal Aunt     breast   History  Substance Use Topics  . Smoking status: Current Some Day Smoker -- 0.25 packs/day for 30 years    Types: Cigarettes    Last Attempt to Quit: 12/11/2012  . Smokeless tobacco: Never Used     Comment: None in 5 days  . Alcohol Use: 0.0 oz/week    14-16 Cans of beer per week     Comment: quit "heavy drinking" 3 yrs ago- just recently started back about 6 months ago does not drink daily no-last time was about 3 days ago-3  cans of beer   OB History   Grav Para Term Preterm Abortions TAB SAB Ect Mult Living   2 2 2       2      Review of Systems  Constitutional: Negative for fever.  Respiratory: Negative for cough and shortness of breath.   Cardiovascular: Negative for chest pain.  Gastrointestinal: Positive for nausea and vomiting. Negative for abdominal pain and diarrhea.  Genitourinary: Negative for dysuria, urgency, frequency, vaginal bleeding and vaginal discharge.  Neurological: Positive for light-headedness. Negative for weakness and numbness.  All other systems reviewed and are negative.      Allergies  Review of patient's allergies indicates no known allergies.  Home Medications   Current Outpatient Rx  Name  Route  Sig  Dispense  Refill  . lisinopril-hydrochlorothiazide (PRINZIDE,ZESTORETIC) 10-12.5 MG per tablet   Oral   Take 1 tablet by mouth daily.         . metFORMIN (GLUCOPHAGE) 500 MG tablet   Oral   Take 2 tablets (1,000 mg total) by mouth 2 (two) times daily with a meal.   120 tablet   11   . pravastatin (PRAVACHOL) 40 MG  tablet   Oral   Take 1 tablet (40 mg total) by mouth every evening.   30 tablet   11    BP 171/110  Pulse 79  Temp(Src) 98.2 F (36.8 C) (Oral)  Resp 18  SpO2 95%  LMP 01/22/2013 Physical Exam  Nursing note and vitals reviewed. Constitutional: She appears well-developed and well-nourished. No distress.  HENT:  Head: Normocephalic and atraumatic.  Neck: Neck supple.  Cardiovascular: Normal rate and regular rhythm.   Pulmonary/Chest: Effort normal and breath sounds normal. No respiratory distress. She has no wheezes. She has no rales.  Abdominal: Soft. She exhibits no distension. There is no tenderness. There is no rebound and no guarding.  Musculoskeletal: She exhibits no edema.  Neurological: She is alert.  Skin: She is not diaphoretic.  Psychiatric: Her mood appears anxious.    ED Course  Procedures (including critical care  time) Labs Review Labs Reviewed  COMPREHENSIVE METABOLIC PANEL - Abnormal; Notable for the following:    Sodium 128 (*)    Chloride 89 (*)    Glucose, Bld 238 (*)    BUN 5 (*)    Total Protein 8.6 (*)    AST 141 (*)    ALT 166 (*)    Alkaline Phosphatase 175 (*)    All other components within normal limits  SALICYLATE LEVEL - Abnormal; Notable for the following:    Salicylate Lvl <2.0 (*)    All other components within normal limits  CBG MONITORING, ED - Abnormal; Notable for the following:    Glucose-Capillary 244 (*)    All other components within normal limits  ACETAMINOPHEN LEVEL  CBC  ETHANOL  URINE RAPID DRUG SCREEN (HOSP PERFORMED)  CBG MONITORING, ED   Imaging Review No results found.   EKG Interpretation None      1:06 PM Discussed pt with Dr Denton Lank.   Plan is for IVF and assessment for ETOH detox.    MDM   Final diagnoses:  Alcohol abuse  Dehydration    Pt with alcohol abuse, anxiety, depression.  NO SI.  Requesting detox.  She is also dehydrated - states she is not eating due to anxiety.  Pending evaluation for placement.       Trixie Dredge, PA-C 03/22/13 1615

## 2013-03-22 NOTE — ED Notes (Signed)
Spoke with lab. They have pt sample available

## 2013-03-22 NOTE — ED Notes (Signed)
PATIENT STATES "I FEEL GREAT". STATES SHE WOULD LIKE INPATIENT DETOX AND TREATMENT FOR HER ALCOHOL PROBLEM. STATES HER LAST INPATIENT DETOX WAS ABOUT 5 YEARS AGO. STATES SHE MAINTAINED HER SOBRIETY UNTIL ABOUT 2-3 MONTHS AGO WHEN SHE RELAPSED. STATES SHE ONLY DRINKS BEER. STATES USUALLY ABOUT 6 12 OUNCE CANS. STATES LAST ETOH LAST NIGHT AROUND 9 PM. SHE IS WARM AND DRY. CALM. NO VISIBLE TREMOR,.

## 2013-03-22 NOTE — BH Assessment (Signed)
Tele Assessment Note   [name] is an 48 y.o. female with history of alcoholism. Patient presents to [facility] requesting alcohol detox. States she usually drinks 6 beers (12 oz cans) per day. She reports drinking this amount for the past 2-3 months. Her last use was yesterday; 6 beers (12 oz cans).  Her age of first use was 62.  Last did detox 5 years ago. She report nausea, vomiting, and tremors earlier today. Sts she feels slightly better. Patient has received treatment from [facility] and and unk facility in [place]. Her longest period of sobriety is is 5 yrs. She is anxious and depressed. She is also very tearful though out the assessment. She reports poor appetite and lack of sleep especially in the past 3 days. She reports stressors with her 2 daughters (age 96 and 15). Says that custody was taken away recently (3 days ago) and her kids are living with a family member. Patient reports many cases related to CPS involvement and she doesn't know who is reporting her. Says this makes her feel stressed.  Denies SI, HI, and AVH's. No related history   Axis I:  Alcohol Dependence and Depressive Disorder Nos Axis II: Deferred Axis III:  Past Medical History  Diagnosis Date  . Tuberculosis     was treated  . Gonorrhea   . Chlamydia   . Breast abscess   . Substance abuse     alcohol  . Recurrent upper respiratory infection (URI)     URI/SOB-treated with antibiotics-cleared up now  . Depression     PTSD also  . Anemia     when in late teens  . Diabetes mellitus without complication   . Hypertension   . Hypercholesteremia    Axis IV: other psychosocial or environmental problems, problems related to social environment, problems with access to health care services and problems with primary support group Axis V: 31-40 impairment in reality testing  Past Medical History:  Past Medical History  Diagnosis Date  . Tuberculosis     was treated  . Gonorrhea   . Chlamydia   .  Breast abscess   . Substance abuse     alcohol  . Recurrent upper respiratory infection (URI)     URI/SOB-treated with antibiotics-cleared up now  . Depression     PTSD also  . Anemia     when in late teens  . Diabetes mellitus without complication   . Hypertension   . Hypercholesteremia     Past Surgical History  Procedure Laterality Date  . Tongue surgery  2008    lump removed  . Incise and drain abcess  01/03/11    breast abscess    Family History:  Family History  Problem Relation Age of Onset  . Cancer Mother     breast  . Cancer Sister     breast  . Cancer Paternal Aunt     breast    Social History:  reports that she has been smoking Cigarettes.  She has a 7.5 pack-year smoking history. She has never used smokeless tobacco. She reports that she drinks alcohol. She reports that she does not use illicit drugs.  Additional Social History:  Alcohol / Drug Use Pain Medications: SEE MAR Prescriptions: SEE MAR Over the Counter: SEE MAR  CIWA: CIWA-Ar BP: 126/89 mmHg Pulse Rate: 68 Nausea and Vomiting: no nausea and no vomiting Tactile Disturbances: none Tremor: not visible, but can be felt fingertip to fingertip Auditory Disturbances: not present  Paroxysmal Sweats: no sweat visible Visual Disturbances: not present Anxiety: no anxiety, at ease Headache, Fullness in Head: none present Agitation: normal activity Orientation and Clouding of Sensorium: oriented and can do serial additions CIWA-Ar Total: 1 COWS:    Allergies: No Known Allergies  Home Medications:  (Not in a hospital admission)  OB/GYN Status:  Patient's last menstrual period was 01/22/2013.  General Assessment Data [facility] Is this a Tele or Face-to-Face Assessment?: Tele Assessment Is this an Initial Assessment or a Re-assessment for this encounter?: Initial Assessment Living Arrangements: Other (Comment);Spouse/significant other (lives with spouse (2 daughters taken  away by DSS)) Can pt return to current living arrangement?: No Admission Status: Voluntary Is patient capable of signing voluntary admission?: No Transfer from: Acute Hospital Referral Source: Self/Family/Friend     [facility] Living Arrangements: Other (Comment);Spouse/significant other (lives with spouse (2 daughters taken away by DSS)) Name of Psychiatrist:  (No psychiatrist ) Name of Therapist:  (No therapist )  Education Status Is patient currently in school?: No  Risk to self Suicidal Ideation: No Suicidal Intent: No Is patient at risk for suicide?: No Suicidal Plan?: No Access to Means: No What has been your use of drugs/alcohol within the last 12 months?:  (n/a) Previous Attempts/Gestures: No How many times?:  (0) Other Self Harm Risks:  (n/a) Triggers for Past Attempts: Other (Comment) (no previous attempts or gestures ) Intentional Self Injurious Behavior: None Family Suicide History: Unknown Recent stressful life event(s): Other (Comment) (custody taken away & 2 daughters taken out of home) Persecutory voices/beliefs?: No Depression: Yes Depression Symptoms: Feeling angry/irritable;Isolating;Tearfulness;Insomnia;Despondent;Fatigue;Guilt;Loss of interest in usual pleasures;Feeling worthless/self pity Substance abuse history and/or treatment for substance abuse?: No Suicide prevention information given to non-admitted patients: Not applicable  Risk to Others Homicidal Ideation: No Thoughts of Harm to Others: No Current Homicidal Intent: No Current Homicidal Plan: No Access to Homicidal Means: No Identified Victim:  (n/a) History of harm to others?: No Assessment of Violence: None Noted Violent Behavior Description:  (patient is calm and cooperative) Does patient have access to weapons?: No Criminal Charges Pending?: Yes Describe Pending Criminal Charges:  (none reported ) Does patient have a court date: No  Psychosis Hallucinations: None  noted Delusions: None noted  Mental Status Report Appear/Hygiene: Disheveled Eye Contact: Good Motor Activity: Freedom of movement Speech: Logical/coherent Level of Consciousness: Alert Mood: Depressed Affect: Appropriate to circumstance Anxiety Level: None Thought Processes: Coherent Judgement: Unimpaired Orientation: Person;Place;Time;Situation Obsessive Compulsive Thoughts/Behaviors: None  Cognitive Functioning Concentration: Normal Memory: Recent Intact;Remote Intact IQ: Average Insight: Fair Impulse Control: Fair Appetite: Poor Weight Loss:  (Pt reports no appetite in the past 3 days ) Weight Gain:  (none reported ) Sleep: No Change Total Hours of Sleep:  (varies ) Vegetative Symptoms: None  ADLScreening [facility] Assessment Services) Patient's cognitive ability adequate to safely complete daily activities?: Yes Patient able to express need for assistance with ADLs?: Yes Independently performs ADLs?: Yes (appropriate for developmental age)  Prior Inpatient Therapy Prior Inpatient Therapy: Yes Prior Therapy Dates:  (past ) Prior Therapy Facilty/Provider(s):  Editor, commissioning([facility] [place] ) Reason for Treatment:  (substance abuse treatment )  Prior Outpatient Therapy Prior Outpatient Therapy: No Prior Therapy Dates:  (n/a) Prior Therapy Facilty/Provider(s):  (n/a) Reason for Treatment:  (n/a)  ADL Screening (condition at time of admission) Patient's cognitive ability adequate to safely complete daily activities?: Yes Is the patient deaf or have difficulty hearing?: No Does the patient  have difficulty seeing, even when wearing glasses/contacts?: No Does the patient have difficulty concentrating, remembering, or making decisions?: No Patient able to express need for assistance with ADLs?: Yes Does the patient have difficulty dressing or bathing?: No Independently performs ADLs?: Yes (appropriate for developmental age) Does the patient have  difficulty walking or climbing stairs?: No Weakness of Legs: None Weakness of Arms/Hands: None  Home Assistive Devices/Equipment Home Assistive Devices/Equipment: None    Abuse/Neglect Assessment (Assessment to be complete while patient is alone) Physical Abuse: Denies Verbal Abuse: Denies Sexual Abuse: Denies Exploitation of patient/patient's resources: Denies Self-Neglect: Denies Values / Beliefs Cultural Requests During Hospitalization: None Spiritual Requests During Hospitalization: None   Advance Directives (For Healthcare) Advance Directive: Patient does not have advance directive Nutrition Screen- [facility] Adult/[facility]/AP Patient's home diet: Carb modified  Additional Information 1:1 In Past 12 Months?: No CIRT Risk: No Elopement Risk: No Does patient have medical clearance?: Yes     Disposition:  Disposition Initial Assessment Completed for this Encounter: Yes Disposition of Patient: Inpatient treatment program (Pt referred to ) Type of inpatient treatment program: Adult (pt accepted to [facility] by Renata Caprice, NP to Dr. Dub Mikes )  Melynda Ripple [facility] 03/22/2013 6:40 PM

## 2013-03-22 NOTE — ED Notes (Signed)
[facility] been accepted at [facility]

## 2013-03-22 NOTE — ED Notes (Signed)
Pt has arrived on pod c 

## 2013-03-22 NOTE — ED Notes (Signed)
teleassessment in progress 

## 2013-03-22 NOTE — Discharge Planning (Signed)
[facility] Felicia E, [employer] Liaison  Spoke to patient about primary care resources, patient was given the orange card application and my contact information for any future questions or concerns.

## 2013-03-22 NOTE — Tx Team (Signed)
Initial Interdisciplinary Treatment Plan  PATIENT STRENGTHS: (choose at least two) Ability for insight Active sense of humor Average or above average intelligence Capable of independent living Communication skills General fund of knowledge Motivation for treatment/growth Physical Health Religious Affiliation Special hobby/interest Supportive family/friends Work skills  PATIENT STRESSORS: Financial difficulties Legal issue Loss of daughter to foster care Marital or family conflict Medication change or noncompliance Substance abuse   PROBLEM LIST: Problem List/Patient Goals Date to be addressed Date deferred Reason deferred Estimated date of resolution  "Detox" 03/22/13     "Getting into [facility]" 03/22/13           Depression 03/22/13     Increased risk for suicide 03/22/13     Etoh abuse 03/22/13     Medical issues 03/22/13                  DISCHARGE CRITERIA:  Ability to meet basic life and health needs Adequate post-discharge living arrangements Improved stabilization in mood, thinking, and/or behavior Medical problems require only outpatient monitoring Motivation to continue treatment in a less acute level of care Need for constant or close observation no longer present Reduction of life-threatening or endangering symptoms to within safe limits Safe-care adequate arrangements made Verbal commitment to aftercare and medication compliance Withdrawal symptoms are absent or subacute and managed without 24-hour nursing intervention  PRELIMINARY DISCHARGE PLAN: Attend aftercare/continuing care group Attend 12-step recovery group Outpatient therapy Participate in family therapy Placement in alternative living arrangements  PATIENT/FAMIILY INVOLVEMENT: This treatment plan has been presented to and reviewed with the patient, Carrie QualiaCharlotte Cain Wilkinson, and/or family member.  The patient and family have been given the opportunity to ask questions and make  suggestions.  Carrie MichaelBrooks, [name] [facility] 03/22/2013, 8:36 PM

## 2013-03-22 NOTE — ED Notes (Signed)
Dr Loretha Staplerwofford made aware of pt disposition.

## 2013-03-22 NOTE — ED Notes (Signed)
Per toyka she will be seeing the pt around 5 pm. Expressed concern about the delay in getting pt assessment done.

## 2013-03-23 ENCOUNTER — Encounter (HOSPITAL_COMMUNITY): Payer: Self-pay | Admitting: Psychiatry

## 2013-03-23 DIAGNOSIS — F411 Generalized anxiety disorder: Secondary | ICD-10-CM

## 2013-03-23 DIAGNOSIS — F1020 Alcohol dependence, uncomplicated: Secondary | ICD-10-CM

## 2013-03-23 HISTORY — DX: Alcohol dependence, uncomplicated: F10.20

## 2013-03-23 LAB — GLUCOSE, CAPILLARY
GLUCOSE-CAPILLARY: 146 mg/dL — AB (ref 70–99)
Glucose-Capillary: 163 mg/dL — ABNORMAL HIGH (ref 70–99)
Glucose-Capillary: 211 mg/dL — ABNORMAL HIGH (ref 70–99)
Glucose-Capillary: 168 mg/dL — ABNORMAL HIGH (ref 70–99)

## 2013-03-23 NOTE — [facility] Suicide Risk Assessment (Signed)
[facility] INPATIENT: Family/Significant Other Suicide Prevention Education  Suicide Prevention Education:  Education Completed; No one has been identified by the patient as the family member/significant other with whom the patient will be residing, and identified as the person(s) who will aid the patient in the event of a mental health crisis (suicidal ideations/suicide attempt).   Pt did not c/o SI at admission, nor have they endorsed SI during their stay here. SPE not required. SPI pamphlet provided to pt and she was encouraged to share information with support network, ask questions, and talk about any concerns relating to SPE.  The Sherwin-WilliamsHeather Smart, LCSWA 03/23/2013 10:48 AM
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
## 2013-03-23 NOTE — Progress Notes (Signed)
Psychoeducational Group Note  Date:  03/23/2013 Time:  0900  Group Topic/Focus:  Recovery Goals:   The focus of this group is to identify appropriate goals for recovery and establish a plan to achieve them.  Participation Level: Did Not Attend  Participation Quality:  Not Applicable  Affect:  Not Applicable  Cognitive:  Not Applicable  Insight:  Not Applicable  Engagement in Group: Not Applicable  Chikita Dogan MCCOLLUM 03/23/2013, 11:18 AM 

## 2013-03-23 NOTE — Progress Notes (Signed)
Patient ID: [name], female   DOB: 11/04/1965, 48 y.o.   MRN: [id number] She has been up and to groups interacting some with staff and peers. Will answer questions when asked a question. Se;f Inventory: depression 7, hopelessness 7,denies withdrawal symptoms and SI thoughts.
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
## 2013-03-23 NOTE — [facility] Group Notes (Signed)
[facility] LCSW Group Therapy  03/23/2013 3:02 PM  Type of Therapy:  Group Therapy  Participation Level:  Minimal  Participation Quality:  Drowsy  Affect:  Lethargic  Cognitive:  Lacking  Insight:  Limited  Engagement in Therapy:  Limited  Modes of Intervention:  Confrontation, Discussion, Education, Exploration, Problem-solving, Rapport Building, Socialization and Support  Summary of Progress/Problems: MHA Speaker came to talk about his personal journey with substance abuse and addiction. The pt processed ways by which to relate to the speaker. MHA speaker provided handouts and educational information pertaining to groups and services offered by the [facility]. [name] was lethargic and inattentive throughout today's therapy group. She had difficulty remaining awake during the speaker's presentation and does not appear to be making progress in the group setting at this time.    Smart, Jayston Trevino LCSWA  03/23/2013, 3:02 PM
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
## 2013-03-23 NOTE — Progress Notes (Signed)
Adult Psychoeducational Group Note  Date:  03/23/2013 Time:  9:44 PM  Group Topic/Focus:  Wrap-Up Group:   The focus of this group is to help patients review their daily goal of treatment and discuss progress on daily workbooks.  Participation Level:  Active  Participation Quality:  Appropriate  Affect:  Appropriate  Cognitive:  Alert and Oriented  Insight: Appropriate  Engagement in Group:  Developing/Improving  Modes of Intervention:  Clarification, Exploration and Support  Additional Comments:  Patient reported that she had a long day. Patient stated that she learned from her groups that she needs to change her attitude.  Roben Schliep, Randal Bubaerri Lee 03/23/2013, 9:44 PM
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
## 2013-03-23 NOTE — ED Provider Notes (Signed)
Medical screening examination/treatment/procedure(s) were conducted as a shared visit with non-physician practitioner(s) and myself.  I personally evaluated the patient during the encounter.   EKG Interpretation None      Pt with hx etoh abuse, requests detox/rehab. Denies hx complicated etoh withdrawal, dts or szs. Alert, content. No tremor or shakes.   Suzi RootsKevin E Zayaan Kozak, MD 03/23/13 [phone]

## 2013-03-23 NOTE — Progress Notes (Signed)
Recreation Therapy Notes  Animal-Assisted Activity/Therapy (AAA/T) Program Checklist/Progress Notes Patient Eligibility Criteria Checklist & Daily Group note for Rec Tx Intervention  Date: 03.03.2015 Time: 2:45pm [address]    AAA/T Program Assumption of Risk Form signed by Patient/ or Parent Legal Guardian yes  Patient is free of allergies or sever asthma yes  Patient reports no fear of animals yes  Patient reports no history of cruelty to animals yes   Patient understands his/her participation is voluntary yes  Patient washes hands before animal contact yes  Patient washes hands after animal contact yes  Behavioral Response: Observation, Appropriate   Education: Hand Washing, Appropriate Animal Interaction   Education Outcome: Acknowledges understanding   Clinical Observations/Feedback: Patient observed peer interaction, approximately 10 minutes into session patient started displaying symptoms of mild allergic reaction - runny nose, puffy eyes. Patient chose to leave session early. No allergies were listed on patient consent form.   [name], LRT/CTRS  [name], [name] 03/23/2013 4:46 PM
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
## 2013-03-24 DIAGNOSIS — F1994 Other psychoactive substance use, unspecified with psychoactive substance-induced mood disorder: Secondary | ICD-10-CM

## 2013-03-24 LAB — GLUCOSE, CAPILLARY
Glucose-Capillary: 173 mg/dL — ABNORMAL HIGH (ref 70–99)
Glucose-Capillary: 127 mg/dL — ABNORMAL HIGH (ref 70–99)

## 2013-03-24 MED ORDER — PRAVASTATIN SODIUM 40 MG PO TABS: 40.0000 mg | ORAL_TABLET | Freq: Every evening | ORAL | Status: DC

## 2013-03-24 MED ORDER — LISINOPRIL-HYDROCHLOROTHIAZIDE 10-12.5 MG PO TABS: 1.0000 | ORAL_TABLET | Freq: Every day | ORAL | Status: DC

## 2013-03-24 MED ORDER — METFORMIN HCL 500 MG PO TABS: 1000.0000 mg | ORAL_TABLET | Freq: Two times a day (BID) | ORAL | Status: DC

## 2013-03-24 MED ORDER — PRAVASTATIN SODIUM 40 MG PO TABS
40.0000 mg | ORAL_TABLET | Freq: Every day | ORAL | Status: DC
  Filled 2013-03-24: qty 13

## 2013-03-24 MED ORDER — TRAZODONE HCL 50 MG PO TABS: 50.0000 mg | ORAL_TABLET | Freq: Every evening | ORAL | Status: DC | PRN
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
## 2013-03-24 NOTE — Progress Notes (Signed)
D Pt. Denies SI and HI, had no complaints of pain or discomfort noted.  A Writer offered support and encouragement, discussed coping skills with pt.  R Pt. Remained safe on the unit.  Pt. Was eager to discharge stating she was ready to [facility] morning at 8 am.  Discharge instructions were read to the  Pt. Along with her prescription medications, and her belongings from her locker.  Pt. Was safely escorted to the front lobby.
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
## 2013-03-24 NOTE — B[facility] Suicide Risk Assessment (Signed)
Suicide Risk Assessment  Discharge Assessment     Demographic Factors:  NA  Total Time spent with patient: 45 minutes  Psychiatric Specialty Exam:     Blood pressure 115/76, pulse 76, temperature 97.2 F (36.2 C), temperature source Oral, resp. rate 16, height 5' 1.75" (1.568 m), last menstrual period 01/22/2013.There is no weight on file to calculate BMI.  General Appearance: Fairly Groomed  Patent attorneyye Contact::  Fair  Speech:  Clear and Coherent and Slow  Volume:  Decreased  Mood:  worried  Affect:  worried  Thought Process:  Coherent and Goal Directed  Orientation:  Full (Time, Place, and Person)  Thought Content:  anticipating going [facility] in the morning, relapse prevention plan  Suicidal Thoughts:  No  Homicidal Thoughts:  No  Memory:  Immediate;   Fair Recent;   Fair Remote;   Fair  Judgement:  Fair  Insight:  Present  Psychomotor Activity:  Normal  Concentration:  Fair  Recall:  FiservFair  Fund of Knowledge:NA  Language: Fair  Akathisia:  No  Handed:    AIMS (if indicated):     Assets:  Desire for Improvement Housing  Sleep:  Number of Hours: 6.75    Musculoskeletal: Strength & Muscle Tone: within normal limits Gait & Station: normal Patient leans: N/A   Mental Status Per Nursing Assessment::   On Admission:  NA  Current Mental Status by Physician: In full contact with reality. No active S/S of withdrawal. Plans to go home this evening do laundry get ready to [facility]. States she is committed. Her being able to have her daughter back is dependent on it   Loss Factors: Loss of significant relationship  Historical Factors: NA  Risk Reduction Factors:   Responsible for children under 47 years of age, Sense of responsibility to family and Living with another person, especially a relative  Continued Clinical Symptoms:  Alcohol/Substance Abuse/Dependencies  Cognitive Features That Contribute To Risk:  Closed-mindedness Polarized  thinking Thought constriction (tunnel vision)    Suicide Risk:  Minimal: No identifiable suicidal ideation.  Patients presenting with no risk factors but with morbid ruminations; may be classified as minimal risk based on the severity of the depressive symptoms  Discharge Diagnoses:   AXIS I:  Alcohol Dependence, Substance induced mood disorder AXIS II:  Deferred AXIS III:   Past Medical History  Diagnosis Date  . Tuberculosis     was treated  . Gonorrhea   . Chlamydia   . Breast abscess   . Substance abuse     alcohol  . Recurrent upper respiratory infection (URI)     URI/SOB-treated with antibiotics-cleared up now  . Depression     PTSD also  . Anemia     when in late teens  . Diabetes mellitus without complication   . Hypertension   . Hypercholesteremia    AXIS IV:  other psychosocial or environmental problems AXIS V:  61-70 mild symptoms  Plan Of Care/Follow-up recommendations:  Activity:  as tolerated Diet:  as per nutritionist Follow up [facility] Is patient on multiple antipsychotic therapies at discharge:  No   Has Patient had three or more failed trials of antipsychotic monotherapy by history:  No  Recommended Plan for Multiple Antipsychotic Therapies: NA    Coral Timme A 03/24/2013, 3:13 PM

## 2013-03-24 NOTE — Progress Notes (Signed)
NUTRITION ASSESSMENT  Pt identified as at risk on the Malnutrition Screen Tool  INTERVENTION: 1. Educated patient on the importance of nutrition and encouraged intake of food and beverages. 2. Discussed weight goals. 3. Supplements: MVI and thiamine daily  NUTRITION DIAGNOSIS: Unintentional weight loss related to sub-optimal intake as evidenced by pt report.   Goal: Pt to meet >/= 90% of their estimated nutrition needs.  Monitor:  PO intake  Assessment:  Patient admitted with depression, anxiety and etoh abuse.  Hx includes type 2 DM with HgbAqC 9.9 12/16/12.  Has been [facility] RD [facility].  Currently sodium is low- 128.  Patient states that she is now 151 lbs and has lost 22 lbs in the past 7 months.  Patient states that she is eating well now but prior to admit was not eating or sleeping for more than a week.  May only eat a sandwich.  Patient was able to verbalize the guidelines of a healthy diabetic diet.    48 y.o. female  Height: Ht Readings from Last 1 Encounters:  03/22/13 5' 1.75" (1.568 m)    Weight: Wt Readings from Last 1 Encounters:  12/16/12 163 lb 4.8 oz (74.072 kg)    Weight Hx: Wt Readings from Last 10 Encounters:  12/16/12 163 lb 4.8 oz (74.072 kg)  08/14/12 171 lb 9.6 oz (77.837 kg)  08/12/12 173 lb 4.8 oz (78.608 kg)  07/07/12 172 lb 14.4 oz (78.427 kg)  05/19/12 176 lb 8 oz (80.06 kg)  02/11/12 179 lb (81.194 kg)  02/04/12 177 lb 14.4 oz (80.695 kg)  01/07/12 177 lb 12.8 oz (80.65 kg)  10/07/11 179 lb 12.8 oz (81.557 kg)  08/08/11 182 lb 3.2 oz (82.645 kg)    BMI:  27.7 Pt meets criteria for overweight based on current BMI.  Estimated Nutritional Needs: Kcal: 25-30 kcal/kg Protein: > 1 gram protein/kg Fluid: 1 ml/kcal  Diet Order: General Pt is also offered choice of unit snacks mid-morning and mid-afternoon.  Pt is eating as desired.   Lab results and medications reviewed.   Oran ReinLaura Jobe, RD, LDN Clinical  Inpatient Dietitian Pager:  [phone] Weekend and after hours pager:  [phone]

## 2013-03-24 NOTE — Clinical Social Work Note (Signed)
Per MD, pt stable for d/c at 4:30PM this evening. She will be picked up by a family friend and taken to her home to prepare for [facility]. This friend will also transport [facility] 8am tomorrow for screening and likely admission. Pt aware that she must [place] [facility] [place], 14 day med supply, and clothing.  The Sherwin-WilliamsHeather Smart, LCSWA 03/24/2013 .3:34 PM

## 2013-03-24 NOTE — [facility] Group Notes (Signed)
[facility] Dis[facility] Planning Group Note   03/24/2013 10:20 AM  Participation Quality:  Appropriate   Mood/Affect:  Appropriate  Depression Rating:  6  Anxiety Rating:  7  Thoughts of Suicide:  No Will you contract for safety?   NA  Current AVH:  No  Plan for Discharge/Comments:  Pt reports mild agitation due to family issues with daughter. Pt states that DSS agent Arsenio KatzLeslie McGee is coming to day with some important paperwork for pt to sign. Pt aware of [facility] admission scheduled for tomorrow at 8am. She is calling family friend for transportation and will inform CSW if she has ride or needs bus pass this afternoon. [facility] to [facility].   Transportation Means: friend or bus   Supports: family supports/husband/family friend   Counselling psychologistmart, Lebron QuamHeather LCSWA

## 2013-03-24 NOTE — Progress Notes (Signed)
[facility] Discharge Plan :  Will you be returning to the same living situation after discharge: Yes,  home until [facility] tomorrow. At discharge, do you have transportation home?:Yes,  family friend Do you have the ability to pay for your medications:Yes,  mental health  Release of information consent forms completed and submitted to Medical Records by CSW.  Patient to Follow up at: Follow-up Information   [facility]. (Ar[place] by 8am with [employer] [place], medications, and clothing for screening and possible admission. )    Contact information:   [address] [place], [place] [address] Phone: [phone] Fax: [phone]      [facility]. (Walk in between 8am-9am Monday through Friday for hospital followup/medication management.)    Contact information:   [address] [place], [place] [address] Phone: [phone] Fax: [phone]      Patient denies SI/HI:   Yes,  during admission/group/self report.    Safety Planning and Suicide Prevention discussed:  Yes,  SPE not required for this pt as she did not endorse SI during admission or [facility]. SPI pamphlet provided to pt and she was encouraged to share information with support network, ask questions, and talk about any concerns relating to SPE.  Smart, Leslea Vowles LCSWA 03/24/2013, 3:32 PM
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
## 2013-03-29 NOTE — Progress Notes (Signed)
Patient Discharge Instructions:  After Visit Summary (AVS):   Faxed to:  03/29/13 Discharge Summary Note:   Faxed to:  03/29/13 Psychiatric Admission Assessment Note:   Faxed to:  03/29/13 Suicide Risk Assessment - Discharge Assessment:   Faxed to:  03/29/13 Faxed/Sent to the Next Level Care provider:  03/29/13 [facility] @ [phone] [facility]ax[facility] to [facility] @ [phone]  Jerelene ReddenSheena E Hollins, 03/29/2013, 2:44 PM

## 2013-04-09 ENCOUNTER — Emergency Department (HOSPITAL_BASED_OUTPATIENT_CLINIC_OR_DEPARTMENT_OTHER)
Admission: EM | Admit: 2013-04-09 | Discharge: 2013-04-09 | Disposition: A | Discharge status: Home / Self Care | Payer: Self-pay | Attending: Emergency Medicine | Admitting: Emergency Medicine

## 2013-04-09 ENCOUNTER — Encounter (HOSPITAL_BASED_OUTPATIENT_CLINIC_OR_DEPARTMENT_OTHER): Payer: Self-pay | Admitting: Emergency Medicine

## 2013-04-09 ENCOUNTER — Encounter: Payer: Self-pay | Admitting: Internal Medicine

## 2013-04-09 DIAGNOSIS — E7800 Pure hypercholesterolemia, unspecified: Secondary | ICD-10-CM | POA: Insufficient documentation

## 2013-04-09 DIAGNOSIS — G589 Mononeuropathy, unspecified: Secondary | ICD-10-CM | POA: Insufficient documentation

## 2013-04-09 DIAGNOSIS — I10 Essential (primary) hypertension: Secondary | ICD-10-CM | POA: Insufficient documentation

## 2013-04-09 DIAGNOSIS — F329 Major depressive disorder, single episode, unspecified: Secondary | ICD-10-CM | POA: Insufficient documentation

## 2013-04-09 DIAGNOSIS — G629 Polyneuropathy, unspecified: Secondary | ICD-10-CM

## 2013-04-09 DIAGNOSIS — Z8619 Personal history of other infectious and parasitic diseases: Secondary | ICD-10-CM | POA: Insufficient documentation

## 2013-04-09 DIAGNOSIS — Z862 Personal history of diseases of the blood and blood-forming organs and certain disorders involving the immune mechanism: Secondary | ICD-10-CM | POA: Insufficient documentation

## 2013-04-09 DIAGNOSIS — Z87448 Personal history of other diseases of urinary system: Secondary | ICD-10-CM | POA: Insufficient documentation

## 2013-04-09 DIAGNOSIS — Z8611 Personal history of tuberculosis: Secondary | ICD-10-CM | POA: Insufficient documentation

## 2013-04-09 DIAGNOSIS — E119 Type 2 diabetes mellitus without complications: Secondary | ICD-10-CM | POA: Insufficient documentation

## 2013-04-09 DIAGNOSIS — Z8709 Personal history of other diseases of the respiratory system: Secondary | ICD-10-CM | POA: Insufficient documentation

## 2013-04-09 DIAGNOSIS — F17200 Nicotine dependence, unspecified, uncomplicated: Secondary | ICD-10-CM | POA: Insufficient documentation

## 2013-04-09 DIAGNOSIS — Z79899 Other long term (current) drug therapy: Secondary | ICD-10-CM | POA: Insufficient documentation

## 2013-04-09 DIAGNOSIS — F3289 Other specified depressive episodes: Secondary | ICD-10-CM | POA: Insufficient documentation

## 2013-04-09 LAB — CBG MONITORING, ED: Glucose-Capillary: 144 mg/dL — ABNORMAL HIGH (ref 70–99)

## 2013-04-09 NOTE — ED Provider Notes (Signed)
I saw and evaluated the patient, reviewed the resident's note and I agree with the findings and plan.   EKG Interpretation None      Pt with h/o DM, [facility]. Has neuropathy of feet. Normal exam. Advised PCP followup.   Charles B. Bernette MayersSheldon, MD 04/09/13 1341

## 2013-04-09 NOTE — ED Provider Notes (Signed)
CSN: [id number]     Arrival date & time 04/09/13  [id number] History   First MD Initiated Contact with Patient 04/09/13 [phone]     Chief Complaint  Patient presents with  . Leg Pain   HPI [name]  Is a 48 y.o. female presented to the ED with burning tingling pain in the back her calves and the bottom of her feet that started last week. Bottom of feet and legs feel "funny" she could not got comfortable last night. She also meds to mild tingling in her fingertips. Started last week and now getting worse. She has received no relief with ibuprofen that she took this morning at 7 AM. She does not check her sugars she states she does not have a monitor. It was checked last week and it was 153. Patient reports she takes her metformin twice a day as scheduled. Patient is [facility] which she [facility] 2 weeks ago. Her last drink was February 27. She does admit to polyuria and polydipsia, she feels has increased in the last 4 days. She states her appetite has returned and she is eating well now. She denies any fever, nausea or vomiting.  Past Medical History  Diagnosis Date  . Tuberculosis     was treated  . Gonorrhea   . Chlamydia   . Breast abscess   . Substance abuse     alcohol  . Recurrent upper respiratory infection (URI)     URI/SOB-treated with antibiotics-cleared up now  . Depression     PTSD also  . Anemia     when in late teens  . Diabetes mellitus without complication   . Hypertension   . Hypercholesteremia    Past Surgical History  Procedure Laterality Date  . Tongue surgery  2008    lump removed  . Incise and drain abcess  01/03/11    breast abscess  . Breast lumpectomy     Family History  Problem Relation Age of Onset  . Cancer Mother     breast  . Cancer Sister     breast  . Cancer Paternal Aunt     breast   History  Substance Use Topics  . Smoking status: Current Some Day Smoker -- 0.50 packs/day for 30 years    Types:  Cigarettes    Last Attempt to Quit: 12/11/2012  . Smokeless tobacco: Never Used     Comment: None in 5 days  . Alcohol Use: 0.0 oz/week    14-16 Cans of beer per week     Comment: last drink 2/27 states she is in recovery   OB History   Grav Para Term Preterm Abortions TAB SAB Ect Mult Living   2 2 2       2      Review of Systems  Constitutional: Positive for appetite change. Negative for fever, chills, activity change, fatigue and unexpected weight change.  Respiratory: Negative for cough, chest tightness, shortness of breath and wheezing.   Cardiovascular: Negative for chest pain and palpitations.  Gastrointestinal: Negative for nausea, vomiting, abdominal pain, diarrhea, constipation and abdominal distention.  Endocrine: Positive for polydipsia and polyuria.  Genitourinary: Negative for dysuria and flank pain.  All other systems reviewed and are negative.   Allergies  Review of patient's allergies indicates no known allergies.  Home Medications   Current Outpatient Rx  Name  Route  Sig  Dispense  Refill  . lisinopril-hydrochlorothiazide (PRINZIDE,ZESTORETIC) 10-12.5 MG per tablet  Oral   Take 1 tablet by mouth daily. For hypertension         . metFORMIN (GLUCOPHAGE) 500 MG tablet   Oral   Take 2 tablets (1,000 mg total) by mouth 2 (two) times daily with a meal. For diabetic management   120 tablet   11   . pravastatin (PRAVACHOL) 40 MG tablet   Oral   Take 1 tablet (40 mg total) by mouth every evening. For high cholesterol   30 tablet   11   . traZODone (DESYREL) 50 MG tablet   Oral   Take 1 tablet (50 mg total) by mouth at bedtime and may repeat dose one time if needed. For sleep   60 tablet   0    BP 132/80  Pulse 74  Temp(Src) 97.4 F (36.3 C) (Oral)  Resp 16  Ht 5\' 3"  (1.6 m)  Wt 156 lb (70.761 kg)  BMI 27.64 kg/m2  SpO2 99%  LMP 01/22/2013 Physical Exam Gen: NAD. Nontoxic HEENT: AT. [place].  Bilateral eyes without injections or icterus.  MMM. CV: RRR, no murmurs clicks gallops or rubs Chest: CTAB, no wheeze or crackles Abd: Soft. NTND. BS present. No Masses palpated.  Ext: No erythema. No edema. No tenderness Pulses equal bilateral lower extremities. Bilateral feet well perfused. No opens wounds or signs of trauma/infection. Negative Homans. Skin: No rashes, purpura or petechiae.  Neuro:  Normal gait. PERLA. EOMi. Alert. Grossly intact.   ED Course  Procedures (including critical care time) Labs Review Labs Reviewed  CBG MONITORING, ED - Abnormal; Notable for the following:    Glucose-Capillary 144 (*)    All other components within normal limits   Imaging Review No results found.   EKG Interpretation None      MDM   Final diagnoses:  Neuropathy   Patient presents to ED nontoxic in no acute distress. Patient is likely suffering from neuropathic pain versus restless leg syndrome. Patient currently taking trazodone for sleep at night. Patient previously had uncontrolled diabetes with hemoglobin A1c of 9 last month. CBG today is 144. Patient is currently going through alcohol detox in [facility]. Last drink  February 27. Patient was encouraged to call her PCP and arrange appointment to be evaluated for neuropathy and discuss possible management. Patient was encouraged to cancel for pain until she sees her PCP.    Natalia Leatherwood, DO 04/09/13 [phone]

## 2013-04-09 NOTE — ED Notes (Signed)
Bilateral feet tingling and bilateral leg pain x 1 week.

## 2013-04-09 NOTE — Discharge Instructions (Signed)

## 2013-04-14 ENCOUNTER — Encounter: Payer: Self-pay | Admitting: Internal Medicine

## 2013-04-14 ENCOUNTER — Ambulatory Visit (INDEPENDENT_AMBULATORY_CARE_PROVIDER_SITE_OTHER): Payer: Self-pay | Admitting: Internal Medicine

## 2013-04-14 VITALS — BP 115/76 | HR 75 | Temp 99.0°F | Ht 64.0 in | Wt 159.8 lb

## 2013-04-14 DIAGNOSIS — N181 Chronic kidney disease, stage 1: Secondary | ICD-10-CM

## 2013-04-14 DIAGNOSIS — E1165 Type 2 diabetes mellitus with hyperglycemia: Secondary | ICD-10-CM

## 2013-04-14 DIAGNOSIS — E785 Hyperlipidemia, unspecified: Secondary | ICD-10-CM

## 2013-04-14 DIAGNOSIS — E1129 Type 2 diabetes mellitus with other diabetic kidney complication: Secondary | ICD-10-CM

## 2013-04-14 DIAGNOSIS — F1020 Alcohol dependence, uncomplicated: Secondary | ICD-10-CM

## 2013-04-14 DIAGNOSIS — I10 Essential (primary) hypertension: Secondary | ICD-10-CM

## 2013-04-14 DIAGNOSIS — E119 Type 2 diabetes mellitus without complications: Secondary | ICD-10-CM

## 2013-04-14 DIAGNOSIS — N898 Other specified noninflammatory disorders of vagina: Secondary | ICD-10-CM | POA: Insufficient documentation

## 2013-04-14 DIAGNOSIS — E1142 Type 2 diabetes mellitus with diabetic polyneuropathy: Secondary | ICD-10-CM

## 2013-04-14 DIAGNOSIS — E1140 Type 2 diabetes mellitus with diabetic neuropathy, unspecified: Secondary | ICD-10-CM

## 2013-04-14 DIAGNOSIS — R748 Abnormal levels of other serum enzymes: Secondary | ICD-10-CM | POA: Insufficient documentation

## 2013-04-14 DIAGNOSIS — I129 Hypertensive chronic kidney disease with stage 1 through stage 4 chronic kidney disease, or unspecified chronic kidney disease: Secondary | ICD-10-CM

## 2013-04-14 DIAGNOSIS — E7409 Other glycogen storage disease: Secondary | ICD-10-CM | POA: Insufficient documentation

## 2013-04-14 LAB — POCT GLYCOSYLATED HEMOGLOBIN (HGB A1C): HEMOGLOBIN A1C: 9

## 2013-04-14 LAB — GLUCOSE, CAPILLARY: Glucose-Capillary: 167 mg/dL — ABNORMAL HIGH (ref 70–99)

## 2013-04-14 MED ORDER — GLIPIZIDE 10 MG PO TABS: 10.0000 mg | ORAL_TABLET | Freq: Every day | ORAL | Status: DC

## 2013-04-14 MED ORDER — GABAPENTIN 100 MG PO CAPS: 100.0000 mg | ORAL_CAPSULE | Freq: Three times a day (TID) | ORAL | Status: DC

## 2013-04-14 NOTE — Assessment & Plan Note (Signed)
Elevated LFTs dating back to 2012.  Most likely cause is patient's ETOH abuse however AST is not greater then ALT.  Given abstinence for 1 month will recheck LFTs with lipid profile. Also concern for viral hepatitis and will check hepatitis panel (may also need vaccination or hep B if neg and immunized).  If LFTs remain elevated and viral hepatitis ruled out will need to stop pravastatin and look for other causes but given patient's high LDL will hold off on holding this at this time. - CMP - Hepatitis Panel - Continue to encourage abstinence from ETOH.

## 2013-04-14 NOTE — Assessment & Plan Note (Signed)
Denies any current discharge. Has already treated with Monostat for yeast infection.  Possible BV.  Unable to preform vaginal exam/wet prep at this visit.  Patient instructed if she continues to have symptoms to please make sooner return appointment.

## 2013-04-14 NOTE — Assessment & Plan Note (Signed)
Underwent detox in behavioral health, [facility]. - Continue to stress importance of avoidence of ETOH.

## 2013-04-14 NOTE — Assessment & Plan Note (Signed)
Work on better control on diabetes. Has normal monofilament exam but symptoms consistent with diabetic neuropathy. - Start Gabapentin 100mg  TID, will increase slowly to recommended 900mg  total if well tolerated.

## 2013-04-14 NOTE — Assessment & Plan Note (Signed)
LDL not at goal. - Repeat Lipid profile given weight loss - Continue Pravastatin 40mg  for now given cost concerns (also working up Hepatitis may need to D/C if felt drug induced)

## 2013-04-14 NOTE — Assessment & Plan Note (Signed)
BP Readings from Last 3 Encounters:  04/14/13 115/76  04/09/13 132/80  03/24/13 115/76    Lab Results  Component Value Date   NA 128* 03/22/2013   K 3.8 03/22/2013   CREATININE 0.50 03/22/2013    Assessment: Blood pressure control: controlled Progress toward BP goal:  at goal Comments:   Plan: Medications:  Lisinopril - HCTZ 10-12.5mg  daily Educational resources provided:   Self management tools provided:   Other plans:

## 2013-04-14 NOTE — Assessment & Plan Note (Signed)
Lab Results  Component Value Date   HGBA1C 9.0 04/14/2013   HGBA1C 9.9 12/16/2012   HGBA1C 8.7 08/14/2012     Assessment: Diabetes control: poor control (HgbA1C >9%) Progress toward A1C goal:  improved Comments:   Plan: Medications:  Metformin 1g BID, Glipizide 10mg  daily Home glucose monitoring: Frequency: 2 times a day Timing: before breakfast;before dinner Instruction/counseling given: reminded to get eye exam, reminded to bring blood glucose meter & log to each visit, reminded to bring medications to each visit and discussed foot care Educational resources provided: handout Self management tools provided:   Other plans: Started Glipizide 10mg  today. Instructed patient to check BS twice a day and bring log and glucometer to next visit.  Will have patient follow up in 1 month. Retinal scan to be done today. Foot exam completed today, normal monofilament exam. Repeat Urine Microalbumin as evidence of on previous.

## 2013-04-14 NOTE — Progress Notes (Signed)
[facility] Subjective:   Patient ID: [name] female   DOB: 09/02/1965 48 y.o.   MRN: [id number]  HPI: [name] is a 48 y.o. female with a PMH significant for T2DM, HTN, HLD, Alcohol abuse. 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  She was also seen in the ED on 04/09/13 with complaints of lower leg pain thought to be secondary to neuropathy vs restless leg syndrome. She reports she is continuing her ETOH treatment currently living a [facility].  She has been ETOH free since 03/19/13. She plans to be discharged on 04/23/13. She reports currently she has no lower extremity pain.  Although her pain feels like a throbbing pain that travels up and down her legs, usually this happens at night and is associated with tingling at the bottom of her feet.  She also reports concern for a yeast infection. She did not have any vaginal discharge or itching and was taking monostat.  She reports her only symptoms was abnormal smell.  No burning with urination.  Elevated liver enzymes Liver enzymes normal in 2010 abnormal in 12/2010. She denies history of IVDA, No transfusions, She was born in [place] and has lived in [place] and [place].  She has drank ETOH since the age of 48.  She reports her ETOH use increased over the years.  She does take pravastatin daily she thinks this may have been started in 2012.  T2DM She has been taking metformin as prescribed. She did not bring her meter to the visit today or her medications.  [facility] has been checking her sugars roughly twice a day.  She reports readings of 140-150s.  She does report some nocturia, no polydipsia, no blurry vision.  Past Medical History  Diagnosis Date  . Tuberculosis     was treated  . Gonorrhea   . Chlamydia   . Breast abscess   . Substance abuse     alcohol  . Recurrent upper respiratory infection (URI)     URI/SOB-treated with antibiotics-cleared up now   . Depression     PTSD also  . Anemia     when in late teens  . Diabetes mellitus without complication   . Hypertension   . Hypercholesteremia    Current Outpatient Prescriptions  Medication Sig Dispense Refill  . lisinopril-hydrochlorothiazide (PRINZIDE,ZESTORETIC) 10-12.5 MG per tablet Take 1 tablet by mouth daily. For hypertension      . metFORMIN (GLUCOPHAGE) 500 MG tablet Take 2 tablets (1,000 mg total) by mouth 2 (two) times daily with a meal. For diabetic management  120 tablet  11  . pravastatin (PRAVACHOL) 40 MG tablet Take 1 tablet (40 mg total) by mouth every evening. For high cholesterol  30 tablet  11  . traZODone (DESYREL) 50 MG tablet Take 1 tablet (50 mg total) by mouth at bedtime and may repeat dose one time if needed. For sleep  60 tablet  0   No current facility-administered medications for this visit.   Family History  Problem Relation Age of Onset  . Cancer Mother     breast  . Cancer Sister     breast  . Cancer Paternal Aunt     breast   History   Social History  . Marital Status: Single    Spouse Name: N/A    Number of Children: N/A  . Years of Education: GED 171987   Social History Main Topics  . Smoking status: Current Some  Day Smoker -- 0.50 packs/day for 30 years    Types: Cigarettes    Last Attempt to Quit: 12/11/2012  . Smokeless tobacco: Never Used     Comment: None in 5 days  . Alcohol Use: 0.0 oz/week    14-16 Cans of beer per week     Comment: last drink 2/27 states she is in recovery  . Drug Use: No     Comment: sober for 11 yrs  . Sexual Activity: Yes    Birth Control/ Protection: None   Other Topics Concern  . Not on file   Social History Narrative  . No narrative on file   Review of Systems: Review of Systems  Constitutional: Negative for fever, chills, weight loss and malaise/fatigue.  Eyes: Negative for blurred vision.  Respiratory: Negative for cough.   Cardiovascular: Negative for chest pain, claudication and leg  swelling.  Gastrointestinal: Negative for heartburn, abdominal pain, diarrhea and constipation.  Genitourinary: Positive for frequency. Negative for dysuria.  Musculoskeletal: Negative for myalgias.  Neurological: Negative for dizziness, sensory change and headaches.  Endo/Heme/Allergies: Negative for polydipsia.  Psychiatric/Behavioral: Negative for depression.     Objective:  Physical Exam: Filed Vitals:   04/14/13 1323  BP: 115/76  Pulse: 75  Temp: 99 F (37.2 C)  TempSrc: Oral  Height: 5\' 4"  (1.626 m)  Weight: 159 lb 12.8 oz (72.485 kg)  SpO2: 97%   Physical Exam  Nursing note and vitals reviewed. Constitutional: She is well-developed, well-nourished, and in no distress.  HENT:  Head: Normocephalic and atraumatic.  Cardiovascular: Normal rate, regular rhythm, normal heart sounds and intact distal pulses.   No murmur heard. Pulmonary/Chest: Effort normal and breath sounds normal.  Abdominal: Soft. Bowel sounds are normal.  No appreciated organomegaly  Musculoskeletal: She exhibits no edema.  Neurological:  Normal monofilament exam to B/L feet. 2+ DP and PT pulses B/L  Skin: Skin is warm and dry.  No spiderangiomata, Fingers and nails show no contractures.     Assessment & Plan:  Case discussed with Dr. Junita Push See Problem Based Assessment and Plan Medications Ordered Meds ordered this encounter  Medications  . glipiZIDE (GLUCOTROL) 10 MG tablet    Sig: Take 1 tablet (10 mg total) by mouth daily before breakfast.    Dispense:  30 tablet    Refill:  5  . gabapentin (NEURONTIN) 100 MG capsule    Sig: Take 1 capsule (100 mg total) by mouth 3 (three) times daily.    Dispense:  90 capsule    Refill:  2   Other Orders Orders Placed This Encounter  Procedures  . Glucose, capillary  . Lipid panel  . Microalbumin / creatinine urine ratio  . CMP with Estimated GFR (CPT-80053)  . Hepatitis Acute Panel  . POCT glycosylated hemoglobin (Hb A1C)  . HM DIABETES FOOT  EXAM  . Retinal/fundus photography    Standing Status: Future     Number of Occurrences: 1     Standing Expiration Date: 04/15/2014

## 2013-04-14 NOTE — Patient Instructions (Signed)
Please start taking Glipizide 10mg  every morning before breakfast. Start taking Gabapentin 100mg  three times a day for nerve pain. Check your blood sugars before breakfast and before dinner, and if you feel bad call our office if you have blood sugars below 70.  Please keep a record to blood sugars.

## 2013-04-15 LAB — HEPATITIS PANEL, ACUTE
HCV Ab: NEGATIVE
HEP A IGM: NONREACTIVE
Hep B C IgM: NONREACTIVE
Hepatitis B Surface Ag: NEGATIVE

## 2013-04-15 LAB — COMPLETE METABOLIC PANEL WITH GFR
ALK PHOS: 155 U/L — AB (ref 39–117)
ALT: 49 U/L — ABNORMAL HIGH (ref 0–35)
AST: 31 U/L (ref 0–37)
Albumin: 4.1 g/dL (ref 3.5–5.2)
BUN: 8 mg/dL (ref 6–23)
CHLORIDE: 99 meq/L (ref 96–112)
CO2: 28 mEq/L (ref 19–32)
Calcium: 9.5 mg/dL (ref 8.4–10.5)
Creat: 0.59 mg/dL (ref 0.50–1.10)
GFR, Est African American: >89 mL/min
GFR, Est Non African American: >89 mL/min
Glucose, Bld: 157 mg/dL — ABNORMAL HIGH (ref 70–99)
POTASSIUM: 4.3 meq/L (ref 3.5–5.3)
Sodium: 138 mEq/L (ref 135–145)
Total Bilirubin: 0.5 mg/dL (ref 0.2–1.2)
Total Protein: 7.1 g/dL (ref 6.0–8.3)

## 2013-04-15 LAB — LIPID PANEL
CHOLESTEROL: 144 mg/dL (ref 0–200)
HDL: 42 mg/dL (ref >39)
LDL Cholesterol: 73 mg/dL (ref 0–99)
Total CHOL/HDL Ratio: 3.4 Ratio
Triglycerides: 145 mg/dL (ref <150)
VLDL: 29 mg/dL (ref 0–40)

## 2013-04-15 LAB — MICROALBUMIN / CREATININE URINE RATIO
Creatinine, Urine: 14.2 mg/dL
MICROALB UR: 1.36 mg/dL (ref 0.00–1.89)
Microalb Creat Ratio: 95.8 mg/g — ABNORMAL HIGH (ref 0.0–30.0)

## 2013-04-16 ENCOUNTER — Encounter: Payer: Self-pay | Admitting: Internal Medicine

## 2013-04-18 IMAGING — CR DG CHEST 2V
2 series · 2 of 2 positions shown · non-contrast
Comparison: 11/06/2010

CLINICAL DATA: Cough

CHEST - 2 VIEW

[w chest pa]
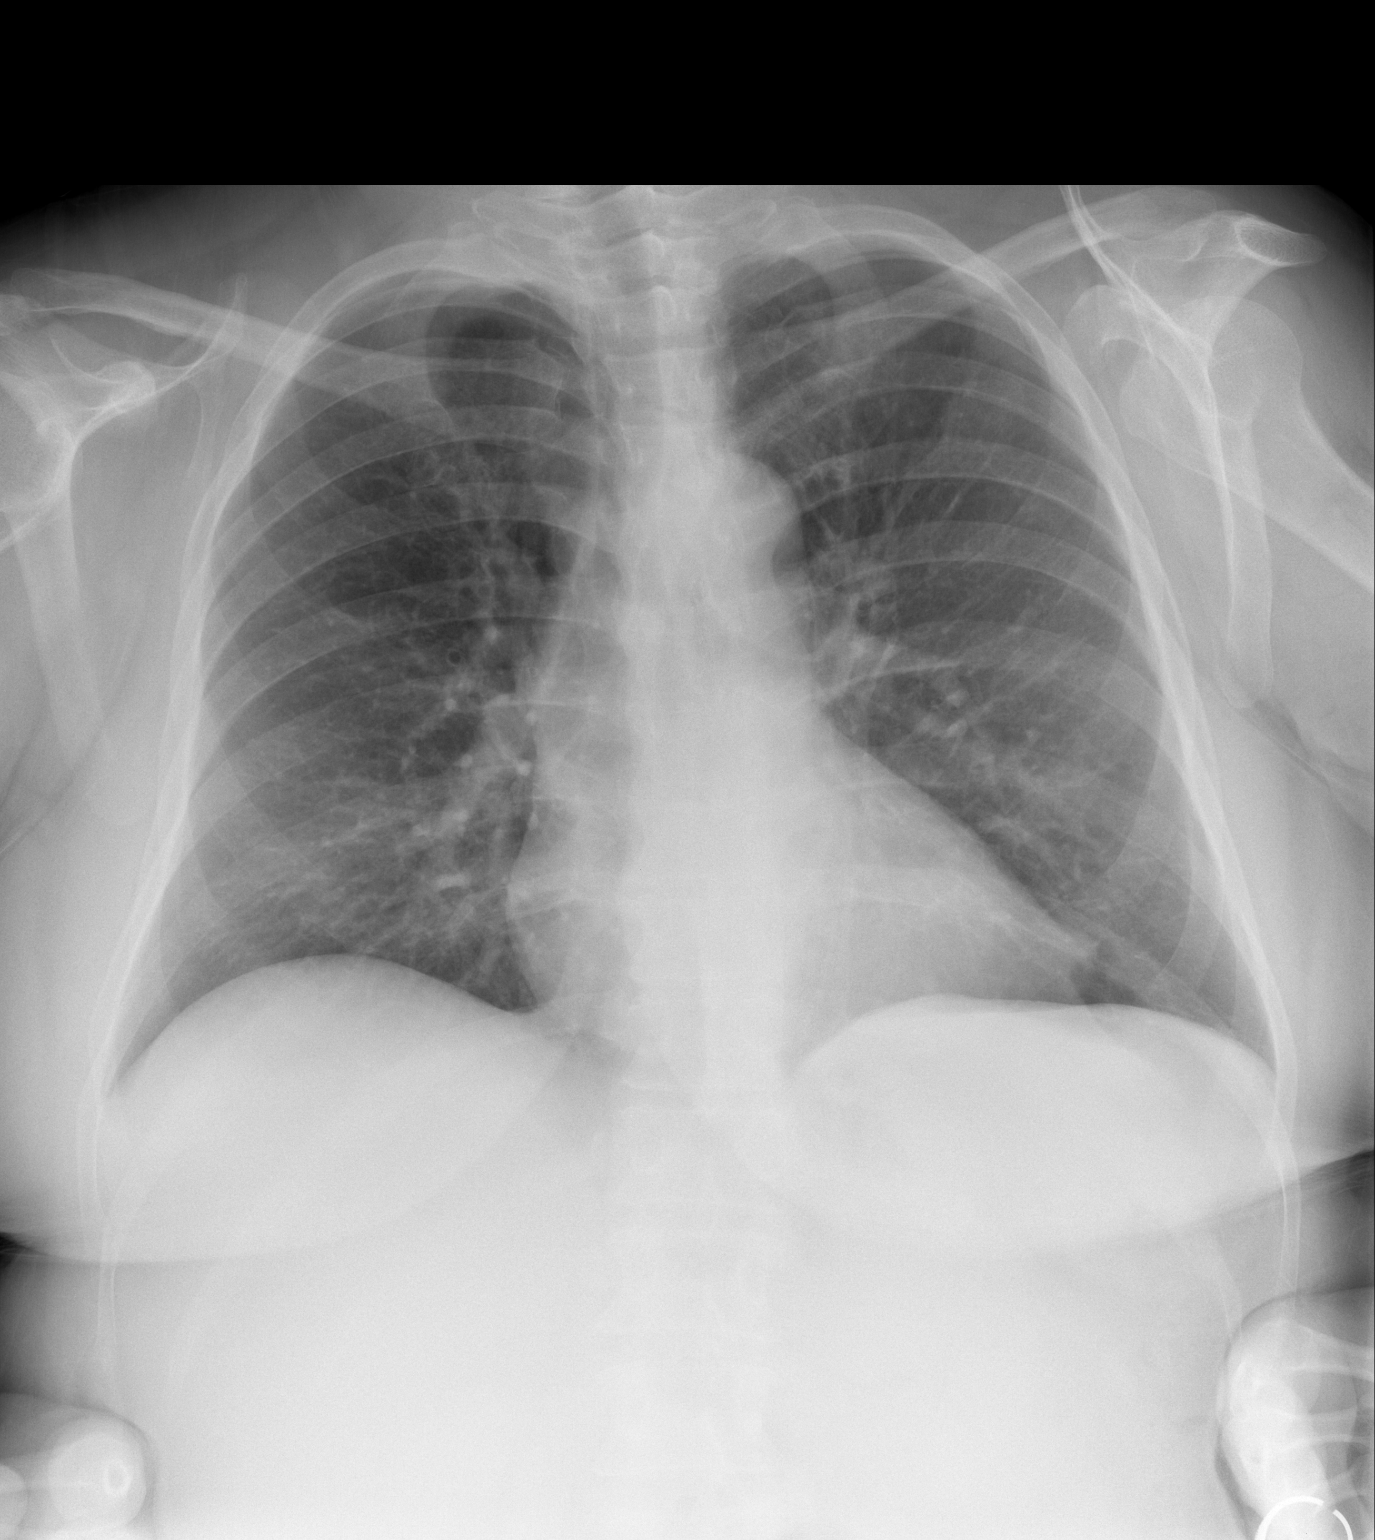

[w chest lat]
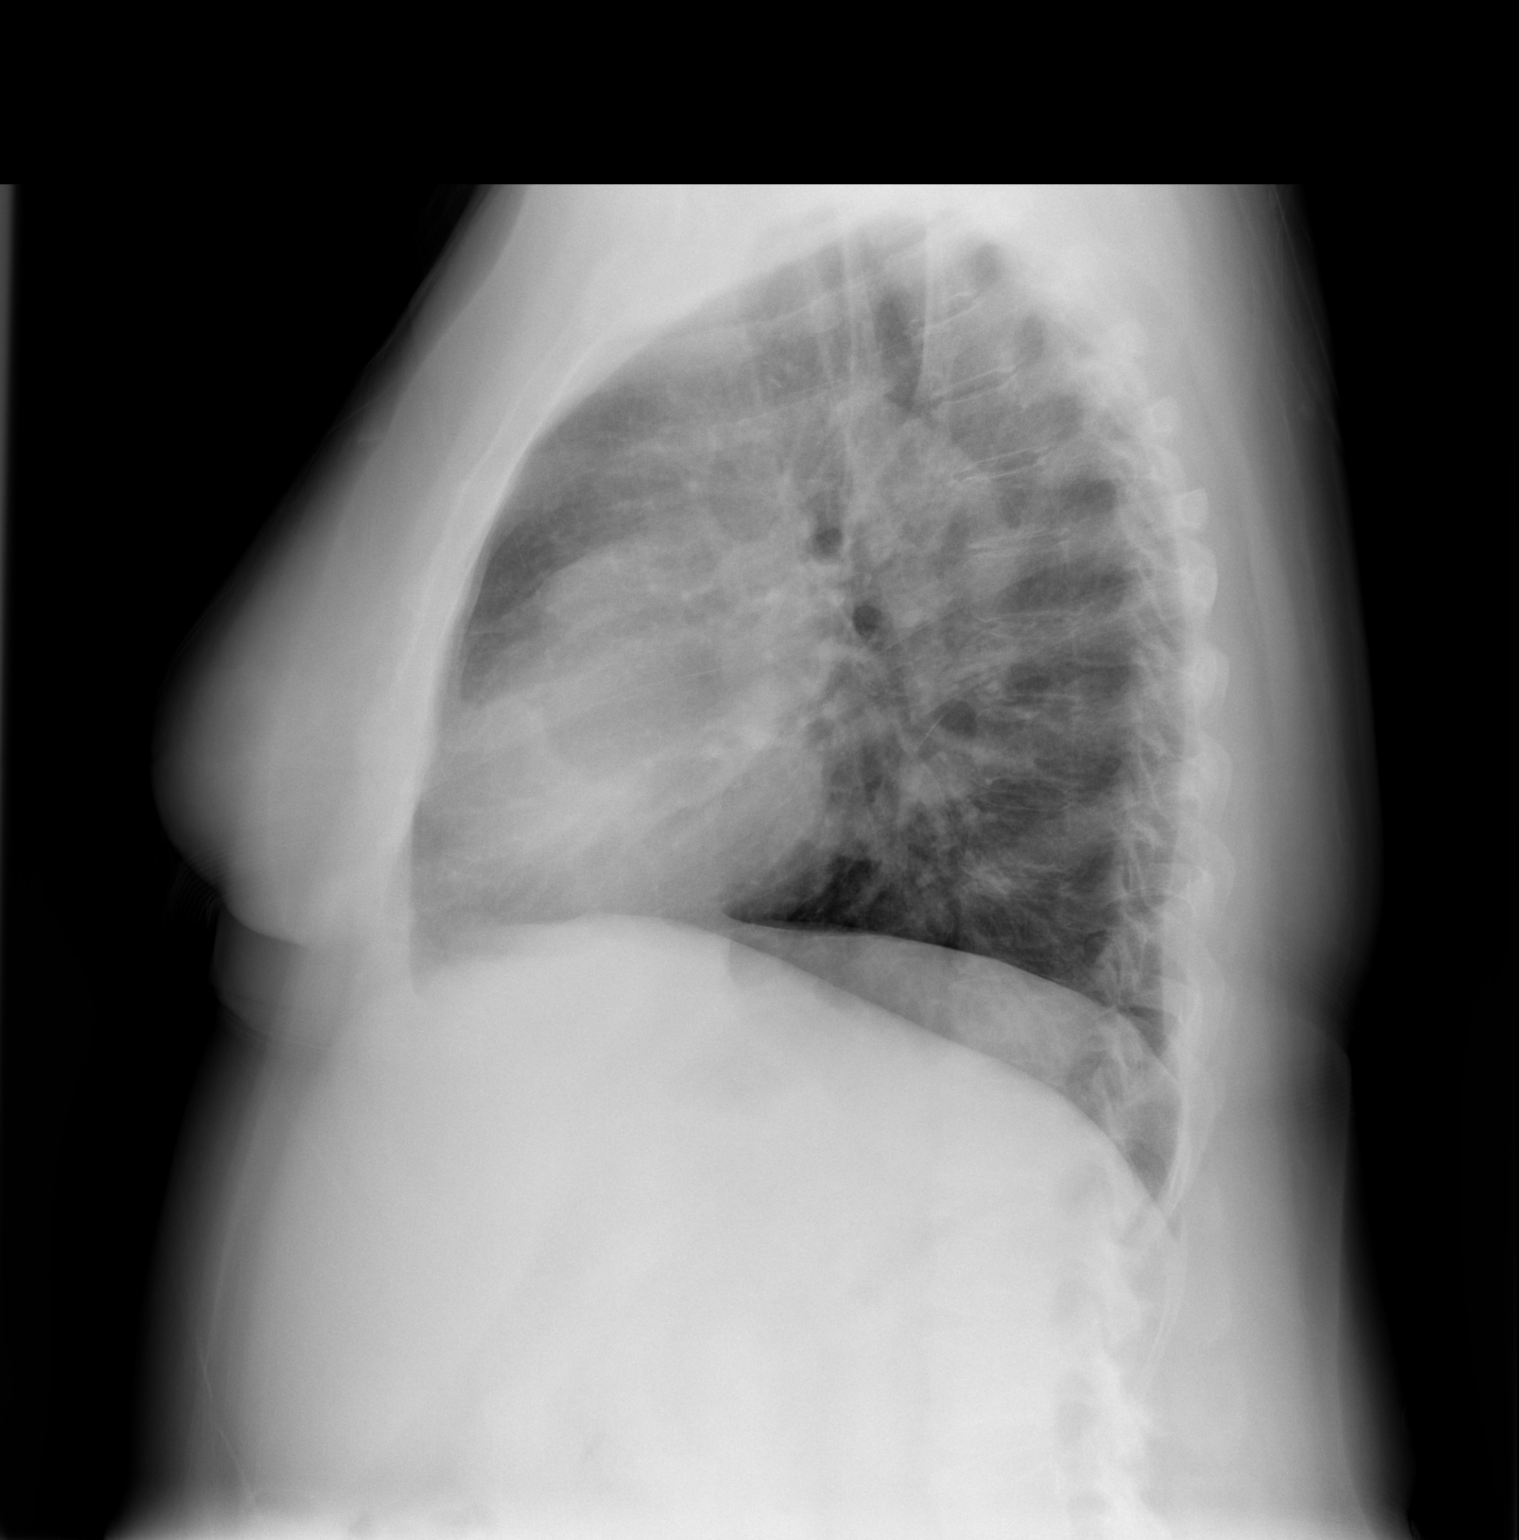

[2 of 2 positions shown; findings below may reference images not displayed]

FINDINGS: Lungs are essentially clear.  No focal consolidation. No
pleural effusion or pneumothorax.

Cardiomediastinal silhouette is within normal limits.

Mild degenerative changes of the visualized thoracolumbar spine.
IMPRESSION: No evidence of acute cardiopulmonary disease.

## 2013-04-20 NOTE — Progress Notes (Signed)
Case discussed with Dr. Hoffman at the time of the visit.  We reviewed the resident's history and exam and pertinent patient test results.  I agree with the assessment, diagnosis, and plan of care documented in the resident's note. 

## 2013-04-28 ENCOUNTER — Encounter: Payer: Self-pay | Admitting: Dietician

## 2013-07-06 ENCOUNTER — Encounter (HOSPITAL_COMMUNITY): Payer: Self-pay | Admitting: Emergency Medicine

## 2013-07-06 ENCOUNTER — Emergency Department (HOSPITAL_COMMUNITY)
Admission: EM | Admit: 2013-07-06 | Discharge: 2013-07-06 | Disposition: A | Discharge status: Home / Self Care | Payer: Self-pay | Attending: Emergency Medicine | Admitting: Emergency Medicine

## 2013-07-06 DIAGNOSIS — Z8611 Personal history of tuberculosis: Secondary | ICD-10-CM | POA: Insufficient documentation

## 2013-07-06 DIAGNOSIS — F329 Major depressive disorder, single episode, unspecified: Secondary | ICD-10-CM | POA: Insufficient documentation

## 2013-07-06 DIAGNOSIS — F3289 Other specified depressive episodes: Secondary | ICD-10-CM | POA: Insufficient documentation

## 2013-07-06 DIAGNOSIS — E7800 Pure hypercholesterolemia, unspecified: Secondary | ICD-10-CM | POA: Insufficient documentation

## 2013-07-06 DIAGNOSIS — Z8619 Personal history of other infectious and parasitic diseases: Secondary | ICD-10-CM | POA: Insufficient documentation

## 2013-07-06 DIAGNOSIS — F17200 Nicotine dependence, unspecified, uncomplicated: Secondary | ICD-10-CM | POA: Insufficient documentation

## 2013-07-06 DIAGNOSIS — Z8709 Personal history of other diseases of the respiratory system: Secondary | ICD-10-CM | POA: Insufficient documentation

## 2013-07-06 DIAGNOSIS — E162 Hypoglycemia, unspecified: Secondary | ICD-10-CM

## 2013-07-06 DIAGNOSIS — Z79899 Other long term (current) drug therapy: Secondary | ICD-10-CM | POA: Insufficient documentation

## 2013-07-06 DIAGNOSIS — R55 Syncope and collapse: Secondary | ICD-10-CM | POA: Insufficient documentation

## 2013-07-06 DIAGNOSIS — Z872 Personal history of diseases of the skin and subcutaneous tissue: Secondary | ICD-10-CM | POA: Insufficient documentation

## 2013-07-06 DIAGNOSIS — I10 Essential (primary) hypertension: Secondary | ICD-10-CM | POA: Insufficient documentation

## 2013-07-06 DIAGNOSIS — Z862 Personal history of diseases of the blood and blood-forming organs and certain disorders involving the immune mechanism: Secondary | ICD-10-CM | POA: Insufficient documentation

## 2013-07-06 DIAGNOSIS — E1169 Type 2 diabetes mellitus with other specified complication: Secondary | ICD-10-CM | POA: Insufficient documentation

## 2013-07-06 DIAGNOSIS — Z043 Encounter for examination and observation following other accident: Secondary | ICD-10-CM | POA: Insufficient documentation

## 2013-07-06 DIAGNOSIS — R4182 Altered mental status, unspecified: Secondary | ICD-10-CM | POA: Insufficient documentation

## 2013-07-06 LAB — CBG MONITORING, ED
GLUCOSE-CAPILLARY: 94 mg/dL (ref 70–99)
GLUCOSE-CAPILLARY: 110 mg/dL — AB (ref 70–99)
Glucose-Capillary: 82 mg/dL (ref 70–99)
Glucose-Capillary: 109 mg/dL — ABNORMAL HIGH (ref 70–99)
Glucose-Capillary: 115 mg/dL — ABNORMAL HIGH (ref 70–99)

## 2013-07-06 LAB — COMPREHENSIVE METABOLIC PANEL
ALK PHOS: 105 U/L (ref 39–117)
ALT: 23 U/L (ref 0–35)
AST: 33 U/L (ref 0–37)
Albumin: 3.8 g/dL (ref 3.5–5.2)
BILIRUBIN TOTAL: 0.4 mg/dL (ref 0.3–1.2)
BUN: 5 mg/dL — AB (ref 6–23)
CHLORIDE: 97 meq/L (ref 96–112)
CO2: 26 mEq/L (ref 19–32)
Calcium: 9.8 mg/dL (ref 8.4–10.5)
Creatinine, Ser: 0.47 mg/dL — ABNORMAL LOW (ref 0.50–1.10)
GFR calc Af Amer: >90 mL/min (ref >90)
GFR calc non Af Amer: >90 mL/min (ref >90)
GLUCOSE: 53 mg/dL — AB (ref 70–99)
POTASSIUM: 3.7 meq/L (ref 3.7–5.3)
Sodium: 137 mEq/L (ref 137–147)
Total Protein: 7.7 g/dL (ref 6.0–8.3)

## 2013-07-06 LAB — URINALYSIS, ROUTINE W REFLEX MICROSCOPIC
Bilirubin Urine: NEGATIVE
Glucose, UA: 100 mg/dL — AB
Hgb urine dipstick: NEGATIVE
Ketones, ur: NEGATIVE mg/dL
NITRITE: NEGATIVE
PROTEIN: 30 mg/dL — AB
Specific Gravity, Urine: 1.01 (ref 1.005–1.030)
UROBILINOGEN UA: 1 mg/dL (ref 0.0–1.0)
pH: 6 (ref 5.0–8.0)

## 2013-07-06 LAB — ETHANOL

## 2013-07-06 LAB — RAPID URINE DRUG SCREEN, HOSP PERFORMED
AMPHETAMINES: NOT DETECTED
Barbiturates: NOT DETECTED
Benzodiazepines: NOT DETECTED
Cocaine: NOT DETECTED
Opiates: NOT DETECTED
Tetrahydrocannabinol: NOT DETECTED

## 2013-07-06 LAB — CBC WITH DIFFERENTIAL/PLATELET
Basophils Absolute: 0 10*3/uL (ref 0.0–0.1)
Basophils Relative: 0 % (ref 0–1)
Eosinophils Absolute: 0.1 10*3/uL (ref 0.0–0.7)
Eosinophils Relative: 1 % (ref 0–5)
HCT: 37.4 % (ref 36.0–46.0)
HEMOGLOBIN: 12.4 g/dL (ref 12.0–15.0)
LYMPHS ABS: 1.1 10*3/uL (ref 0.7–4.0)
LYMPHS PCT: 13 % (ref 12–46)
MCH: 28.6 pg (ref 26.0–34.0)
MCHC: 33.2 g/dL (ref 30.0–36.0)
MCV: 86.4 fL (ref 78.0–100.0)
MONOS PCT: 5 % (ref 3–12)
Monocytes Absolute: 0.4 10*3/uL (ref 0.1–1.0)
NEUTROS ABS: 6.9 10*3/uL (ref 1.7–7.7)
NEUTROS PCT: 81 % — AB (ref 43–77)
Platelets: 254 10*3/uL (ref 150–400)
RBC: 4.33 MIL/uL (ref 3.87–5.11)
RDW: 15.5 % (ref 11.5–15.5)
WBC: 8.4 10*3/uL (ref 4.0–10.5)

## 2013-07-06 LAB — URINE MICROSCOPIC-ADD ON

## 2013-07-06 MED ORDER — SODIUM CHLORIDE 0.9 % IV SOLN
INTRAVENOUS | Status: DC
  Administered 2013-07-06: 18:00:00 via INTRAVENOUS

## 2013-07-06 MED ORDER — SODIUM CHLORIDE 0.9 % IV BOLUS (SEPSIS)
1000.0000 mL | Freq: Once | INTRAVENOUS | Status: AC
  Administered 2013-07-06: 1000 mL via INTRAVENOUS

## 2013-07-06 NOTE — ED Provider Notes (Signed)
CSN: [id number]     Arrival date & time 07/06/13  1658 History   First MD Initiated Contact with Patient 07/06/13 1736     Chief Complaint  Patient presents with  . Seizures  . Hypoglycemia     (Consider location/radiation/quality/duration/timing/severity/associated sxs/prior Treatment) Patient is a 48 y.o. female presenting with seizures and hypoglycemia. The history is provided by the patient and the EMS personnel.  Seizures Hypoglycemia Associated symptoms: seizures    She was walking outside with a friend, when she fell. The friend saw her shake. EMS arrived and found her to have altered mental status, and she was hypoglycemic. Her mental status improved after she was given IV D50. The patient does not remember any preceding symptoms. She's never had this previously. She has never had a seizure. She's taking her usual medications, as prescribed. There are no other known modifying factors.  Past Medical History  Diagnosis Date  . Tuberculosis     was treated  . Gonorrhea   . Chlamydia   . Breast abscess   . Substance abuse     alcohol  . Recurrent upper respiratory infection (URI)     URI/SOB-treated with antibiotics-cleared up now  . Depression     PTSD also  . Anemia     when in late teens  . Diabetes mellitus without complication   . Hypertension   . Hypercholesteremia    Past Surgical History  Procedure Laterality Date  . Tongue surgery  2008    lump removed  . Incise and drain abcess  01/03/11    breast abscess  . Breast lumpectomy     Family History  Problem Relation Age of Onset  . Cancer Mother     breast  . Cancer Sister     breast  . Cancer Paternal Aunt     breast   History  Substance Use Topics  . Smoking status: Current Some Day Smoker -- 0.50 packs/day for 30 years    Types: Cigarettes    Last Attempt to Quit: 12/11/2012  . Smokeless tobacco: Never Used     Comment: None in 5 days  . Alcohol Use: 0.0 oz/week    14-16 Cans of beer per week      Comment: last drink 2/27 states she is in recovery   OB History   Grav Para Term Preterm Abortions TAB SAB Ect Mult Living   2 2 2       2      Review of Systems  Neurological: Positive for seizures.  All other systems reviewed and are negative.     Allergies  Review of patient's allergies indicates no known allergies.  Home Medications   Prior to Admission medications   Medication Sig Start Date End Date Taking? Authorizing Provider  gabapentin (NEURONTIN) 100 MG capsule Take 1 capsule (100 mg total) by mouth 3 (three) times daily. 04/14/13 04/14/14 Yes Carlynn PurlErik Hoffman, DO  glipiZIDE (GLUCOTROL) 10 MG tablet Take 1 tablet (10 mg total) by mouth daily before breakfast. 04/14/13 04/14/14 Yes Carlynn PurlErik Hoffman, DO  lisinopril-hydrochlorothiazide (PRINZIDE,ZESTORETIC) 10-12.5 MG per tablet Take 1 tablet by mouth daily. For hypertension 03/24/13  Yes Sanjuana KavaAgnes I Nwoko, NP  metFORMIN (GLUCOPHAGE) 500 MG tablet Take 2 tablets (1,000 mg total) by mouth 2 (two) times daily with a meal. For diabetic management 03/24/13  Yes Sanjuana KavaAgnes I Nwoko, NP  pravastatin (PRAVACHOL) 40 MG tablet Take 40 mg by mouth 3 (three) times daily.   Yes Historical Provider, MD  traZODone (  DESYREL) 50 MG tablet Take 1 tablet (50 mg total) by mouth at bedtime and may repeat dose one time if needed. For sleep 03/24/13  Yes Sanjuana KavaAgnes I Nwoko, NP   BP 133/77  Pulse 64  Temp(Src) 98.7 F (37.1 C)  Resp 16  Ht 5\' 3"  (1.6 m)  Wt 159 lb (72.122 kg)  BMI 28.17 kg/m2  SpO2 100% Physical Exam  Nursing note and vitals reviewed. Constitutional: She is oriented to person, place, and time. She appears well-developed and well-nourished. No distress.  HENT:  Head: Normocephalic and atraumatic.  Eyes: Conjunctivae and EOM are normal. Pupils are equal, round, and reactive to light.  Neck: Normal range of motion and phonation normal. Neck supple.  Cardiovascular: Normal rate, regular rhythm and intact distal pulses.   Pulmonary/Chest: Effort normal  and breath sounds normal. She exhibits no tenderness.  Abdominal: Soft. She exhibits no distension. There is no tenderness. There is no guarding.  Musculoskeletal: Normal range of motion.  Neurological: She is alert and oriented to person, place, and time. She exhibits normal muscle tone.  Skin: Skin is warm and dry.  Psychiatric: She has a normal mood and affect. Her behavior is normal. Judgment and thought content normal.    ED Course  Procedures (including critical care time)  Medications  sodium chloride 0.9 % bolus 1,000 mL (0 mLs Intravenous Stopped 07/06/13 2246)    Patient Vitals for the past 24 hrs:  BP Temp Pulse Resp SpO2 Height Weight  07/06/13 2335 133/77 mmHg - - - - - -  07/06/13 2300 113/78 mmHg - 64 16 100 % - -  07/06/13 2230 124/81 mmHg - 66 18 100 % - -  07/06/13 2200 117/64 mmHg - 71 - 100 % - -  07/06/13 2145 - - 72 16 99 % - -  07/06/13 2130 123/73 mmHg - 74 16 100 % - -  07/06/13 2100 126/63 mmHg - 66 12 100 % - -  07/06/13 2030 142/91 mmHg - 67 16 100 % - -  07/06/13 2010 133/77 mmHg - 72 13 99 % - -  07/06/13 2000 133/77 mmHg - 66 16 100 % - -  07/06/13 1930 132/77 mmHg - 66 16 99 % - -  07/06/13 1916 126/67 mmHg - 70 17 99 % - -  07/06/13 1915 126/67 mmHg - 65 15 100 % - -  07/06/13 1900 142/101 mmHg - 65 14 100 % - -  07/06/13 1830 137/77 mmHg - 66 16 99 % - -  07/06/13 1800 127/80 mmHg - 78 23 96 % - -  07/06/13 1730 122/82 mmHg - 77 19 100 % - -  07/06/13 1724 123/77 mmHg - 80 17 98 % - -  07/06/13 1705 116/69 mmHg 98.7 F (37.1 C) 81 15 100 % 5\' 3"  (1.6 m) 159 lb (72.122 kg)    2305 Reevaluation with update and discussion. After initial assessment and treatment, an updated evaluation reveals no sz. In ED. Tolerating oral nutrition and normal CBG. Findings discussed with pt, all questions answered.Mancel Bale. WENTZ,ELLIOTT L   Labs Review Labs Reviewed  CBC WITH DIFFERENTIAL - Abnormal; Notable for the following:    Neutrophils Relative % 81 (*)    All  other components within normal limits  COMPREHENSIVE METABOLIC PANEL - Abnormal; Notable for the following:    Glucose, Bld 53 (*)    BUN 5 (*)    Creatinine, Ser 0.47 (*)    All other components within normal limits  URINALYSIS, ROUTINE W REFLEX MICROSCOPIC - Abnormal; Notable for the following:    Glucose, UA 100 (*)    Protein, ur 30 (*)    Leukocytes, UA TRACE (*)    All other components within normal limits  CBG MONITORING, ED - Abnormal; Notable for the following:    Glucose-Capillary 109 (*)    All other components within normal limits  CBG MONITORING, ED - Abnormal; Notable for the following:    Glucose-Capillary 115 (*)    All other components within normal limits  CBG MONITORING, ED - Abnormal; Notable for the following:    Glucose-Capillary 110 (*)    All other components within normal limits  ETHANOL  URINE RAPID DRUG SCREEN (HOSP PERFORMED)  URINE MICROSCOPIC-ADD ON  CBG MONITORING, ED  CBG MONITORING, ED  CBG MONITORING, ED    Imaging Review No results found.   EKG Interpretation   Date/Time:  Tuesday July 06 2013 17:09:11 EDT Ventricular Rate:  78 PR Interval:  194 QRS Duration: 105 QT Interval:  413 QTC Calculation: 470 R Axis:   41 Text Interpretation:  Sinus rhythm RSR' in V1 or V2, right VCD or RVH  since last tracing no significant change Confirmed by Effie Shy  MD, ELLIOTT  [phone]) on 07/06/2013 7:22:30 PM      MDM   Final diagnoses:  Syncope  Hypoglycemia    Episode of hypoglycemia in IDDM. Pt is nontoxic, and no evidence for seizure, SBI or metabolic instability.   Nursing Notes Reviewed/ Care Coordinated Applicable Imaging Reviewed Interpretation of Laboratory Data incorporated into ED treatment  The patient appears reasonably screened and/or stabilized for discharge and I doubt any other medical condition or other [facility] requiring further screening, evaluation, or treatment in the ED at this time prior to discharge.  Plan: Home  Medications- usual; Home Treatments- rest; return here if the recommended treatment, does not improve the symptoms; Recommended follow up- PCP prn    Flint Melter, MD 07/07/13 1205

## 2013-07-06 NOTE — ED Notes (Signed)
CBG 109 

## 2013-07-06 NOTE — ED Notes (Signed)
CBG 82 

## 2013-07-06 NOTE — ED Notes (Signed)
PT from friends, had witnessed seizure activity outside. Pt alert and oriented at this time. Per friend, pt fell outside, Per EMS pt initial cbg 44, received 1 amp d50, last CBG 177. Pt in NSS

## 2013-07-06 NOTE — ED Notes (Signed)
Dr. Effie ShyWentz states no need for CBG-check at 2300.

## 2013-07-06 NOTE — Discharge Instructions (Signed)
Hypoglycemia °Hypoglycemia occurs when the glucose in your blood is too low. Glucose is a type of sugar that is your body's main energy source. Hormones, such as insulin and glucagon, control the level of glucose in the blood. Insulin lowers blood glucose and glucagon increases blood glucose. Having too much insulin in your blood stream, or not eating enough food containing sugar, can result in hypoglycemia. Hypoglycemia can happen to people with or without diabetes. It can develop quickly and can be a medical emergency.  °CAUSES  °· Missing or delaying meals. °· Not eating enough carbohydrates at meals. °· Taking too much diabetes medicine. °· Not timing your oral diabetes medicine or insulin doses with meals, snacks, and exercise. °· Nausea and vomiting. °· Certain medicines. °· Severe illnesses, such as hepatitis, kidney disorders, and certain eating disorders. °· Increased activity or exercise without eating something extra or adjusting medicines. °· Drinking too much alcohol. °· A nerve disorder that affects body functions like your heart rate, blood pressure, and digestion (autonomic neuropathy). °· A condition where the stomach muscles do not function properly (gastroparesis). Therefore, medicines and food may not absorb properly. °· Rarely, a tumor of the pancreas can produce too much insulin. °SYMPTOMS  °· Hunger. °· Sweating (diaphoresis). °· Change in body temperature. °· Shakiness. °· Headache. °· Anxiety. °· Lightheadedness. °· Irritability. °· Difficulty concentrating. °· Dry mouth. °· Tingling or numbness in the hands or feet. °· Restless sleep or sleep disturbances. °· Altered speech and coordination. °· Change in mental status. °· Seizures or prolonged convulsions. °· Combativeness. °· Drowsiness (lethargic). °· Weakness. °· Increased heart rate or palpitations. °· Confusion. °· Pale, gray skin color. °· Blurred or double vision. °· Fainting. °DIAGNOSIS  °A physical exam and medical history will be  performed. Your caregiver may make a diagnosis based on your symptoms. Blood tests and other lab tests may be performed to confirm a diagnosis. Once the diagnosis is made, your caregiver will see if your signs and symptoms go away once your blood glucose is raised.  °TREATMENT  °Usually, you can easily treat your hypoglycemia when you notice symptoms. °· Check your blood glucose. If it is less than 70 mg/dl, take one of the following:   °¨ 3-4 glucose tablets.   °¨ ½ cup juice.   °¨ ½ cup regular soda.   °¨ 1 cup skim milk.   °¨ ½-1 tube of glucose gel.   °¨ 5-6 hard candies.   °· Avoid high-fat drinks or food that may delay a rise in blood glucose levels. °· Do not take more than the recommended amount of sugary foods, drinks, gel, or tablets. Doing so will cause your blood glucose to go too high.   °· Wait 10-15 minutes and recheck your blood glucose. If it is still less than 70 mg/dl or below your target range, repeat treatment.   °· Eat a snack if it is more than 1 hour until your next meal.   °There may be a time when your blood glucose may go so low that you are unable to treat yourself at home when you start to notice symptoms. You may need someone to help you. You may even faint or be unable to swallow. If you cannot treat yourself, someone will need to bring you to the hospital.  °HOME CARE INSTRUCTIONS °· If you have diabetes, follow your diabetes management plan by: °¨ Taking your medicines as directed. °¨ Following your exercise plan. °¨ Following your meal plan. Do not skip meals. Eat on time. °¨ Testing your blood   glucose regularly. Check your blood glucose before and after exercise. If you exercise longer or different than usual, be sure to check blood glucose more frequently.  Wearing your medical alert jewelry that says you have diabetes.  Identify the cause of your hypoglycemia. Then, develop ways to prevent the recurrence of hypoglycemia.  Do not take a hot bath or shower right after an  insulin shot.  Always carry treatment with you. Glucose tablets are the easiest to carry.  If you are going to drink alcohol, drink it only with meals.  Tell friends or family members ways to keep you safe during a seizure. This may include removing hard or sharp objects from the area or turning you on your side.  Maintain a healthy weight. SEEK MEDICAL CARE IF:   You are having problems keeping your blood glucose in your target range.  You are having frequent episodes of hypoglycemia.  You feel you might be having side effects from your medicines.  You are not sure why your blood glucose is dropping so low.  You notice a change in vision or a new problem with your vision. SEEK IMMEDIATE MEDICAL CARE IF:   Confusion develops.  A change in mental status occurs.  The inability to swallow develops.  Fainting occurs. Document Released: 01/07/2005 Document Revised: 01/12/2013 Document Reviewed: 05/06/2011 [facility] Patient Information 2015 [place], [place]. This information is not intended to replace advice given to you by your health care provider. Make sure you discuss any questions you have with your health care provider.  Syncope Syncope is a fainting spell. This means the person loses consciousness and drops to the ground. The person is generally unconscious for less than 5 minutes. The person may have some muscle twitches for up to 15 seconds before waking up and returning to normal. Syncope occurs more often in elderly people, but it can happen to anyone. While most causes of syncope are not dangerous, syncope can be a sign of a serious medical problem. It is important to seek medical care.  CAUSES  Syncope is caused by a sudden decrease in blood flow to the brain. The specific cause is often not determined. Factors that can trigger syncope include:  Taking medicines that lower blood pressure.  Sudden changes in posture, such as standing up suddenly.  Taking more medicine  than prescribed.  Standing in one place for too long.  Seizure disorders.  Dehydration and excessive exposure to heat.  Low blood sugar (hypoglycemia).  Straining to have a bowel movement.  Heart disease, irregular heartbeat, or other circulatory problems.  Fear, emotional distress, seeing blood, or severe pain. SYMPTOMS  Right before fainting, you may:  Feel dizzy or lightheaded.  Feel nauseous.  See all white or all black in your field of vision.  Have cold, clammy skin. DIAGNOSIS  Your caregiver will ask about your symptoms, perform a physical exam, and perform electrocardiography (ECG) to record the electrical activity of your heart. Your caregiver may also perform other heart or blood tests to determine the cause of your syncope. TREATMENT  In most cases, no treatment is needed. Depending on the cause of your syncope, your caregiver may recommend changing or stopping some of your medicines. HOME CARE INSTRUCTIONS  Have someone stay with you until you feel stable.  Do not drive, operate machinery, or play sports until your caregiver says it is okay.  Keep all follow-up appointments as directed by your caregiver.  Lie down right away if you start feeling like  you might faint. Breathe deeply and steadily. Wait until all the symptoms have passed.  Drink enough fluids to keep your urine clear or pale yellow.  If you are taking blood pressure or heart medicine, get up slowly, taking several minutes to sit and then stand. This can reduce dizziness. SEEK IMMEDIATE MEDICAL CARE IF:   You have a severe headache.  You have unusual pain in the chest, abdomen, or back.  You are bleeding from the mouth or rectum, or you have black or tarry stool.  You have an irregular or very fast heartbeat.  You have pain with breathing.  You have repeated fainting or seizure-like jerking during an episode.  You faint when sitting or lying down.  You have confusion.  You have  difficulty walking.  You have severe weakness.  You have vision problems. If you fainted, call your local emergency services (911 in [place] Do not drive yourself to the hospital.  MAKE SURE YOU:  Understand these instructions.  Will watch your condition.  Will get help right away if you are not doing well or get worse. Document Released: 01/07/2005 Document Revised: 07/09/2011 Document Reviewed: 03/08/2011 [facility] Patient Information 2014 [place]Care, [place]LL[place].

## 2013-08-10 ENCOUNTER — Encounter (HOSPITAL_COMMUNITY): Payer: Self-pay | Admitting: Emergency Medicine

## 2013-08-10 ENCOUNTER — Emergency Department (HOSPITAL_COMMUNITY)
Admission: EM | Admit: 2013-08-10 | Discharge: 2013-08-10 | Disposition: A | Discharge status: Home / Self Care | Payer: Self-pay | Attending: Emergency Medicine | Admitting: Emergency Medicine

## 2013-08-10 DIAGNOSIS — E119 Type 2 diabetes mellitus without complications: Secondary | ICD-10-CM | POA: Insufficient documentation

## 2013-08-10 DIAGNOSIS — Z8619 Personal history of other infectious and parasitic diseases: Secondary | ICD-10-CM | POA: Insufficient documentation

## 2013-08-10 DIAGNOSIS — E7800 Pure hypercholesterolemia, unspecified: Secondary | ICD-10-CM | POA: Insufficient documentation

## 2013-08-10 DIAGNOSIS — I9589 Other hypotension: Secondary | ICD-10-CM | POA: Insufficient documentation

## 2013-08-10 DIAGNOSIS — F329 Major depressive disorder, single episode, unspecified: Secondary | ICD-10-CM | POA: Insufficient documentation

## 2013-08-10 DIAGNOSIS — Z79899 Other long term (current) drug therapy: Secondary | ICD-10-CM | POA: Insufficient documentation

## 2013-08-10 DIAGNOSIS — I10 Essential (primary) hypertension: Secondary | ICD-10-CM | POA: Insufficient documentation

## 2013-08-10 DIAGNOSIS — R569 Unspecified convulsions: Secondary | ICD-10-CM | POA: Insufficient documentation

## 2013-08-10 DIAGNOSIS — R55 Syncope and collapse: Secondary | ICD-10-CM | POA: Insufficient documentation

## 2013-08-10 DIAGNOSIS — R634 Abnormal weight loss: Secondary | ICD-10-CM | POA: Insufficient documentation

## 2013-08-10 DIAGNOSIS — Z8709 Personal history of other diseases of the respiratory system: Secondary | ICD-10-CM | POA: Insufficient documentation

## 2013-08-10 DIAGNOSIS — G4700 Insomnia, unspecified: Secondary | ICD-10-CM | POA: Insufficient documentation

## 2013-08-10 DIAGNOSIS — F17200 Nicotine dependence, unspecified, uncomplicated: Secondary | ICD-10-CM | POA: Insufficient documentation

## 2013-08-10 DIAGNOSIS — Z862 Personal history of diseases of the blood and blood-forming organs and certain disorders involving the immune mechanism: Secondary | ICD-10-CM | POA: Insufficient documentation

## 2013-08-10 DIAGNOSIS — F3289 Other specified depressive episodes: Secondary | ICD-10-CM | POA: Insufficient documentation

## 2013-08-10 LAB — URINE MICROSCOPIC-ADD ON

## 2013-08-10 LAB — CBC WITH DIFFERENTIAL/PLATELET
BASOS PCT: 0 % (ref 0–1)
Basophils Absolute: 0 10*3/uL (ref 0.0–0.1)
EOS ABS: 0.1 10*3/uL (ref 0.0–0.7)
EOS PCT: 1 % (ref 0–5)
HCT: 39 % (ref 36.0–46.0)
Hemoglobin: 13.5 g/dL (ref 12.0–15.0)
LYMPHS ABS: 0.8 10*3/uL (ref 0.7–4.0)
Lymphocytes Relative: 11 % — ABNORMAL LOW (ref 12–46)
MCH: 29.2 pg (ref 26.0–34.0)
MCHC: 34.6 g/dL (ref 30.0–36.0)
MCV: 84.4 fL (ref 78.0–100.0)
MONOS PCT: 5 % (ref 3–12)
Monocytes Absolute: 0.4 10*3/uL (ref 0.1–1.0)
Neutro Abs: 6.2 10*3/uL (ref 1.7–7.7)
Neutrophils Relative %: 83 % — ABNORMAL HIGH (ref 43–77)
PLATELETS: 122 10*3/uL — AB (ref 150–400)
RBC: 4.62 MIL/uL (ref 3.87–5.11)
RDW: 14.7 % (ref 11.5–15.5)
WBC: 7.5 10*3/uL (ref 4.0–10.5)

## 2013-08-10 LAB — URINALYSIS, ROUTINE W REFLEX MICROSCOPIC
GLUCOSE, UA: NEGATIVE mg/dL
HGB URINE DIPSTICK: NEGATIVE
Ketones, ur: 15 mg/dL — AB
Nitrite: NEGATIVE
PH: 5.5 (ref 5.0–8.0)
PROTEIN: 100 mg/dL — AB
Specific Gravity, Urine: 1.015 (ref 1.005–1.030)
Urobilinogen, UA: 1 mg/dL (ref 0.0–1.0)

## 2013-08-10 LAB — CBG MONITORING, ED: Glucose-Capillary: 91 mg/dL (ref 70–99)

## 2013-08-10 LAB — ETHANOL: Alcohol, Ethyl (B): <11 mg/dL (ref 0–11)

## 2013-08-10 LAB — RAPID URINE DRUG SCREEN, HOSP PERFORMED
AMPHETAMINES: NOT DETECTED
BENZODIAZEPINES: NOT DETECTED
Barbiturates: NOT DETECTED
Cocaine: NOT DETECTED
Opiates: NOT DETECTED
Tetrahydrocannabinol: NOT DETECTED

## 2013-08-10 LAB — COMPREHENSIVE METABOLIC PANEL
ALBUMIN: 3.8 g/dL (ref 3.5–5.2)
ALK PHOS: 106 U/L (ref 39–117)
ALT: 136 U/L — ABNORMAL HIGH (ref 0–35)
AST: 253 U/L — ABNORMAL HIGH (ref 0–37)
Anion gap: 13 (ref 5–15)
BUN: 8 mg/dL (ref 6–23)
CALCIUM: 9.1 mg/dL (ref 8.4–10.5)
CO2: 26 mEq/L (ref 19–32)
Chloride: 90 mEq/L — ABNORMAL LOW (ref 96–112)
Creatinine, Ser: 0.69 mg/dL (ref 0.50–1.10)
GFR calc Af Amer: >90 mL/min (ref >90)
GFR calc non Af Amer: >90 mL/min (ref >90)
Glucose, Bld: 87 mg/dL (ref 70–99)
POTASSIUM: 3.8 meq/L (ref 3.7–5.3)
SODIUM: 129 meq/L — AB (ref 137–147)
TOTAL PROTEIN: 7.7 g/dL (ref 6.0–8.3)
Total Bilirubin: 1 mg/dL (ref 0.3–1.2)

## 2013-08-10 LAB — ACETAMINOPHEN LEVEL

## 2013-08-10 LAB — TROPONIN I: Troponin I: <0.3 ng/mL (ref <0.30)

## 2013-08-10 MED ORDER — SODIUM CHLORIDE 0.9 % IV BOLUS (SEPSIS)
1000.0000 mL | Freq: Once | INTRAVENOUS | Status: AC
  Administered 2013-08-10: 1000 mL via INTRAVENOUS

## 2013-08-10 NOTE — ED Notes (Signed)
Pt stated she was never given a meter to check blood sugars.  Pt takes Metformin.  Hasn't taken medication today but usually compliant.

## 2013-08-10 NOTE — Discharge Instructions (Signed)
You have an appointment at the [facility]patient [facility] on Thursday, July 23rd at 8:45 am. DO NOT DRIVE, CLIMB LADDERS OR DO ANY ACTIVITY THAT YOU COULD GET HURT IF YOU PASSED OUT AGAIN!!!!.  You should be hearing from the EEG lab about getting an EEG done (brain wave test) to further evaluate your for seizures.  Try to eat and drink better, and try to get more sleep.   Restart your trazadone at bedtime to help you sleep.

## 2013-08-10 NOTE — ED Notes (Signed)
Lab called and advised acetaminophen can be run off of tubes collected earlier

## 2013-08-10 NOTE — ED Provider Notes (Signed)
CSN: [id number]     Arrival date & time 08/10/13  1213 History   First MD Initiated Contact with Patient 08/10/13 1219     Chief Complaint  Patient presents with  . Hypotension  . Seizures     (Consider location/radiation/quality/duration/timing/severity/associated sxs/prior Treatment) HPI Patient reports she was still in bed this morning. She got up to eat and as she was walking to the kitchen she started feeling like her vision got white, she got lightheaded, and she yelled out for her husband. She evidently had a syncopal episode. EMS was called but she refused transport. She evidently had another episode in the living room and EMS was called again. She denies headache, chest pain, shortness of breath, palpitations, diaphoresis, incontinence, any soreness or pain anywhere, thirst, polyuria. She states her tongue is sore and she thinks she bit her tongue. Patient was seen in the ED on June 16 for similar episode when she had a syncopal episode and some shaking. At that time she had a low blood sugar of 40. She reports that her last alcohol was 2 days ago. EMS reports BP was 83 on their arrival.  Patient reports she is under a lot of stress. She states the father of her children's mother, the grandmother has gotten custody of her 2 daughters who are age 599 and [id number]. She states DSS was called and they were coming out weekly. She does not divulge why her children were taken away from her. She reports she has had weight loss because she is not eating. She states she's gone from a size 16 to a size 12. She also states she is not sleeping. She states she's like he did get 3-4 hours of sleep a night. Does not report excessive alcohol use although she does have a history of alcohol abuse. She states she is worried about her children. She only gets supervised visitation for one hour a week. Pt has depression, but denies SI or HI  PCP [facility]   Past Medical History  Diagnosis Date  . Tuberculosis     was  treated  . Gonorrhea   . Chlamydia   . Breast abscess   . Substance abuse     alcohol  . Recurrent upper respiratory infection (URI)     URI/SOB-treated with antibiotics-cleared up now  . Depression     PTSD also  . Anemia     when in late teens  . Diabetes mellitus without complication   . Hypertension   . Hypercholesteremia    Past Surgical History  Procedure Laterality Date  . Tongue surgery  2008    lump removed  . Incise and drain abcess  01/03/11    breast abscess  . Breast lumpectomy     Family History  Problem Relation Age of Onset  . Cancer Mother     breast  . Cancer Sister     breast  . Cancer Paternal Aunt     breast   History  Substance Use Topics  . Smoking status: Current Some Day Smoker -- 0.50 packs/day for 30 years    Types: Cigarettes    Last Attempt to Quit: 12/11/2012  . Smokeless tobacco: Never Used     Comment: None in 5 days  . Alcohol Use: 0.0 oz/week    14-16 Cans of beer per week     Comment: last drink 2/27 states she is in recovery   Lives at home Lives with spouse Stays at home Patient states she  drinks 36 ounces of beer about once a week  OB History   Grav Para Term Preterm Abortions TAB SAB Ect Mult Living   2 2 2       2      Review of Systems  All other systems reviewed and are negative.     Allergies  Review of patient's allergies indicates no known allergies.  Home Medications   Prior to Admission medications   Medication Sig Start Date End Date Taking? Authorizing Provider  gabapentin (NEURONTIN) 100 MG capsule Take 1 capsule (100 mg total) by mouth 3 (three) times daily. 04/14/13 04/14/14 Yes Carlynn Purl, DO  glipiZIDE (GLUCOTROL) 10 MG tablet Take 1 tablet (10 mg total) by mouth daily before breakfast. 04/14/13 04/14/14 Yes Carlynn Purl, DO  lisinopril-hydrochlorothiazide (PRINZIDE,ZESTORETIC) 10-12.5 MG per tablet Take 1 tablet by mouth daily. For hypertension 03/24/13  Yes Sanjuana Kava, NP  metFORMIN  (GLUCOPHAGE) 500 MG tablet Take 500 mg by mouth 2 (two) times daily.   Yes Historical Provider, MD  pravastatin (PRAVACHOL) 40 MG tablet Take 40 mg by mouth daily.    Yes Historical Provider, MD  traZODone (DESYREL) 50 MG tablet Take 1 tablet (50 mg total) by mouth at bedtime and may repeat dose one time if needed. For sleep 03/24/13  Yes Sanjuana Kava, NP   BP 101/63  Temp(Src) 98.5 F (36.9 C) (Oral)  Resp 18  SpO2 94%  Vital signs normal    Orthostatic vital signs normal 13:39 Orthostatic Vital Signs JL Orthostatic Lying - BP- Lying: 130/80 mmHg ; Pulse- Lying: 67  Orthostatic Sitting - BP- Sitting: 129/70 mmHg ; Pulse- Sitting: 70  Orthostatic Standing at 0 minutes - BP- Standing at 0 minutes: 119/75 mmHg ; Pulse- Standing at 0 minutes: 74  Physical Exam  Nursing note and vitals reviewed. Constitutional: She is oriented to person, place, and time. She appears well-developed and well-nourished.  Non-toxic appearance. She does not appear ill. No distress.  HENT:  Head: Normocephalic and atraumatic.  Right Ear: External ear normal.  Left Ear: External ear normal.  Nose: Nose normal. No mucosal edema or rhinorrhea.  Mouth/Throat: Oropharynx is clear and moist and mucous membranes are normal. No dental abscesses or uvula swelling.  Mild tremor of tongue (? Withdrawal), but no bite marks  Eyes: Conjunctivae and EOM are normal. Pupils are equal, round, and reactive to light.  Neck: Normal range of motion and full passive range of motion without pain. Neck supple.  Cardiovascular: Normal rate, regular rhythm and normal heart sounds.  Exam reveals no gallop and no friction rub.   No murmur heard. Pulmonary/Chest: Effort normal and breath sounds normal. No respiratory distress. She has no wheezes. She has no rhonchi. She has no rales. She exhibits no tenderness and no crepitus.  Abdominal: Soft. Normal appearance and bowel sounds are normal. She exhibits no distension. There is no  tenderness. There is no rebound and no guarding.  Musculoskeletal: Normal range of motion. She exhibits no edema and no tenderness.  Moves all extremities well.   Neurological: She is alert and oriented to person, place, and time. She has normal strength. No cranial nerve deficit.  Skin: Skin is warm, dry and intact. No rash noted. No erythema. No pallor.  Psychiatric: She has a normal mood and affect. Her speech is normal and behavior is normal. Her mood appears not anxious.    ED Course  Procedures (including critical care time)  Medications  sodium chloride 0.9 % bolus  1,000 mL (0 mLs Intravenous Stopped 08/10/13 1549)    Pt given her test results. She has an appt in the [facility] on 7/23. EEG ordered as outpatient. At this point I feel she either is going through alcohol withdrawal or she is having dehydration from not being or drinking well because of her anxiety and depression about losing her children. She also has been having insomnia which shortly increases her risk of having a seizure disorder. However with her weight loss and hypertension with EMS arrival today most likely she has had dehydration. They do not feel strongly to start her on anti-seizure medication at this point. Husband never came to the ED  Labs Review Results for orders placed during the hospital encounter of 08/10/13  ETHANOL      Result Value Ref Range   Alcohol, Ethyl (B) <11  0 - 11 mg/dL  CBC WITH DIFFERENTIAL      Result Value Ref Range   WBC 7.5  4.0 - 10.5 K/uL   RBC 4.62  3.87 - 5.11 MIL/uL   Hemoglobin 13.5  12.0 - 15.0 g/dL   HCT 16.1  09.6 - 04.5 %   MCV 84.4  78.0 - 100.0 fL   MCH 29.2  26.0 - 34.0 pg   MCHC 34.6  30.0 - 36.0 g/dL   RDW 40.9  81.1 - 91.4 %   Platelets 122 (*) 150 - 400 K/uL   Neutrophils Relative % 83 (*) 43 - 77 %   Neutro Abs 6.2  1.7 - 7.7 K/uL   Lymphocytes Relative 11 (*) 12 - 46 %   Lymphs Abs 0.8  0.7 - 4.0 K/uL   Monocytes Relative 5  3 - 12 %   Monocytes Absolute 0.4   0.1 - 1.0 K/uL   Eosinophils Relative 1  0 - 5 %   Eosinophils Absolute 0.1  0.0 - 0.7 K/uL   Basophils Relative 0  0 - 1 %   Basophils Absolute 0.0  0.0 - 0.1 K/uL  COMPREHENSIVE METABOLIC PANEL      Result Value Ref Range   Sodium 129 (*) 137 - 147 mEq/L   Potassium 3.8  3.7 - 5.3 mEq/L   Chloride 90 (*) 96 - 112 mEq/L   CO2 26  19 - 32 mEq/L   Glucose, Bld 87  70 - 99 mg/dL   BUN 8  6 - 23 mg/dL   Creatinine, Ser 7.82  0.50 - 1.10 mg/dL   Calcium 9.1  8.4 - 95.6 mg/dL   Total Protein 7.7  6.0 - 8.3 g/dL   Albumin 3.8  3.5 - 5.2 g/dL   AST 213 (*) 0 - 37 U/L   ALT 136 (*) 0 - 35 U/L   Alkaline Phosphatase 106  39 - 117 U/L   Total Bilirubin 1.0  0.3 - 1.2 mg/dL   GFR calc non Af Amer >90  >90 mL/min   GFR calc Af Amer >90  >90 mL/min   Anion gap 13  5 - 15  URINE RAPID DRUG SCREEN (HOSP PERFORMED)      Result Value Ref Range   Opiates NONE DETECTED  NONE DETECTED   Cocaine NONE DETECTED  NONE DETECTED   Benzodiazepines NONE DETECTED  NONE DETECTED   Amphetamines NONE DETECTED  NONE DETECTED   Tetrahydrocannabinol NONE DETECTED  NONE DETECTED   Barbiturates NONE DETECTED  NONE DETECTED  URINALYSIS, ROUTINE W REFLEX MICROSCOPIC      Result Value Ref Range  Color, Urine AMBER (*) YELLOW   APPearance CLOUDY (*) CLEAR   Specific Gravity, Urine 1.015  1.005 - 1.030   pH 5.5  5.0 - 8.0   Glucose, UA NEGATIVE  NEGATIVE mg/dL   Hgb urine dipstick NEGATIVE  NEGATIVE   Bilirubin Urine SMALL (*) NEGATIVE   Ketones, ur 15 (*) NEGATIVE mg/dL   Protein, ur 161 (*) NEGATIVE mg/dL   Urobilinogen, UA 1.0  0.0 - 1.0 mg/dL   Nitrite NEGATIVE  NEGATIVE   Leukocytes, UA SMALL (*) NEGATIVE  TROPONIN I      Result Value Ref Range   Troponin I <0.30  <0.30 ng/mL  URINE MICROSCOPIC-ADD ON      Result Value Ref Range   Squamous Epithelial / LPF MANY (*) RARE   WBC, UA 3-6  <3 WBC/hpf   Bacteria, UA MANY (*) RARE   Casts HYALINE CASTS (*) NEGATIVE  ACETAMINOPHEN LEVEL      Result Value  Ref Range   Acetaminophen (Tylenol), Serum <15.0  10 - 30 ug/mL  CBG MONITORING, ED      Result Value Ref Range   Glucose-Capillary 91  70 - 99 mg/dL   Laboratory interpretation all normal except mild hyponatremia, low chloride, possible UTI although specimen is contaminated    Imaging Review No results found.   EKG Interpretation   Date/Time:  Tuesday August 10 2013 12:24:28 EDT Ventricular Rate:  64 PR Interval:  188 QRS Duration: 117 QT Interval:  467 QTC Calculation: 482 R Axis:   46 Text Interpretation:  Age not entered, assumed to be  48 years old for  purpose of ECG interpretation Sinus rhythm Incomplete right bundle branch  block No significant change since last tracing 06 Jul 2013 Confirmed by  Geneal Huebert  MD-I, Romolo Sieling ([id number]) on 08/10/2013 1:12:13 PM      MDM   Final diagnoses:  Syncope, unspecified syncope type  Weight loss  Insomnia    Plan discharge   Devoria Albe, MD, Armando Gang     Ward Givens, MD 08/10/13 1626

## 2013-08-10 NOTE — Discharge Planning (Signed)
[facility] Community Liaison  Spoke to patient regarding follow up care and [facility]. Patient has been followed with care by [facility] in the past, but hasn't been seen by the practice in a few months. This liaison made the pt a follow up appointment with Internal Medicine for Thursday July 23,2015 at 8:45, pt verbalized understanding of the upcoming appointment. Orange card application provided as well as my contact information for any future questions or concerns. No other Community Liaison needs identified at this time.

## 2013-08-10 NOTE — ED Notes (Signed)
[facility] presents with a 48 yo female from home with hypotension and seizure-like activity.  Pt family called [facility] this am because of seizure-like activity but pt refused transport.  [facility], [facility] arrived on scene and pt was alert oriented and able to communicate with [facility].  [facility] found pt was hypotensive started an IV and gave 250 mls of NS en route.  Pt remains hypotensive but NSR on monitor.

## 2013-08-12 ENCOUNTER — Ambulatory Visit (INDEPENDENT_AMBULATORY_CARE_PROVIDER_SITE_OTHER): Payer: Self-pay | Admitting: Internal Medicine

## 2013-08-12 ENCOUNTER — Ambulatory Visit: Payer: Self-pay | Admitting: Internal Medicine

## 2013-08-12 ENCOUNTER — Encounter: Payer: Self-pay | Admitting: Internal Medicine

## 2013-08-12 VITALS — BP 125/85 | HR 65 | Temp 98.7°F | Ht 64.0 in | Wt 152.9 lb

## 2013-08-12 DIAGNOSIS — G9340 Encephalopathy, unspecified: Secondary | ICD-10-CM

## 2013-08-12 DIAGNOSIS — IMO0002 Reserved for concepts with insufficient information to code with codable children: Secondary | ICD-10-CM

## 2013-08-12 DIAGNOSIS — E1129 Type 2 diabetes mellitus with other diabetic kidney complication: Secondary | ICD-10-CM

## 2013-08-12 DIAGNOSIS — E1165 Type 2 diabetes mellitus with hyperglycemia: Secondary | ICD-10-CM

## 2013-08-12 DIAGNOSIS — F329 Major depressive disorder, single episode, unspecified: Secondary | ICD-10-CM

## 2013-08-12 DIAGNOSIS — E1122 Type 2 diabetes mellitus with diabetic chronic kidney disease: Secondary | ICD-10-CM

## 2013-08-12 DIAGNOSIS — N181 Chronic kidney disease, stage 1: Secondary | ICD-10-CM

## 2013-08-12 DIAGNOSIS — F3289 Other specified depressive episodes: Secondary | ICD-10-CM

## 2013-08-12 DIAGNOSIS — E871 Hypo-osmolality and hyponatremia: Secondary | ICD-10-CM

## 2013-08-12 DIAGNOSIS — F32A Depression, unspecified: Secondary | ICD-10-CM

## 2013-08-12 LAB — COMPLETE METABOLIC PANEL WITH GFR
ALT: 112 U/L — ABNORMAL HIGH (ref 0–35)
AST: 136 U/L — AB (ref 0–37)
Albumin: 4.2 g/dL (ref 3.5–5.2)
Alkaline Phosphatase: 95 U/L (ref 39–117)
BILIRUBIN TOTAL: 1.2 mg/dL (ref 0.2–1.2)
BUN: 7 mg/dL (ref 6–23)
CALCIUM: 9.3 mg/dL (ref 8.4–10.5)
CHLORIDE: 91 meq/L — AB (ref 96–112)
CO2: 27 mEq/L (ref 19–32)
CREATININE: 0.59 mg/dL (ref 0.50–1.10)
GFR, Est African American: >89 mL/min
GFR, Est Non African American: >89 mL/min
Glucose, Bld: 108 mg/dL — ABNORMAL HIGH (ref 70–99)
Potassium: 3.4 mEq/L — ABNORMAL LOW (ref 3.5–5.3)
Sodium: 128 mEq/L — ABNORMAL LOW (ref 135–145)
Total Protein: 7.5 g/dL (ref 6.0–8.3)

## 2013-08-12 LAB — HM DIABETES EYE EXAM

## 2013-08-12 LAB — POCT GLYCOSYLATED HEMOGLOBIN (HGB A1C): HEMOGLOBIN A1C: 5.4

## 2013-08-12 LAB — GLUCOSE, CAPILLARY: GLUCOSE-CAPILLARY: 113 mg/dL — AB (ref 70–99)

## 2013-08-12 NOTE — Progress Notes (Signed)
Subjective:    Patient ID: Carrie QualiaCharlotte Cain Wilkinson, female    DOB: 10/14/1965, 48 y.o.   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  She has been evaluated in the ED twice in the past 6 weeks for syncope.  The first episode was likely due to hypoglycemia, her CBG was 40.  The most recent episode occurred two days ago and involved questionable seizure activity.  EDP felt symptoms could be due to dehydration vs EtOH w/d (she had not had a drink in 2 days).  She can't remember the events but says her husband witnessed the episodes and saw her lose consciousness and start shaking.  She felt lightheaded prior to LOC.  She vomited prior to first syncopal episode.  No auras, chest pain, palpitations, dyspnea.  She denies post-ictal confusion or loss of bowel or bladder control.  She says she bit her tongue.  Both episodes happened in the AM shortly after waking.  She said nothing like this has happened before.    She reports recent stressors - her children are in the custody of their grandparents for the past two months.  Denies EtOH abuse but says she drinks 3 cans beer twice per week.  EPIC notes suggest abuse hx.  No EtOH in 4 days.  She reports poor appetite but says she is drinking fluids.  She has not smoked in four days. Denies drug use.   Follows with family services for therapy.  No SI/HI.  She has husband and other family for support.   She is working with an Pensions consultantattorney to re-gain custody.     Review of Systems  Constitutional: Positive for appetite change. Negative for fever and chills.       Not eating well since she lost custody of her kids.  Respiratory: Negative for shortness of breath.   Cardiovascular: Negative for chest pain.  Gastrointestinal: Positive for diarrhea. Negative for nausea, vomiting, abdominal pain and constipation.       2 days this week.  Has since  resolved.  Genitourinary: Negative for dysuria and frequency.  Neurological: Positive for tremors, syncope and light-headedness. Negative for headaches.  Psychiatric/Behavioral: Positive for sleep disturbance. Negative for suicidal ideas.       Objective:   Physical Exam  Vitals reviewed. Constitutional: She is oriented to person, place, and time. She appears well-developed. No distress.  HENT:  Head: Normocephalic and atraumatic.  Mouth/Throat: Oropharynx is clear and moist. No oropharyngeal exudate.  Eyes: EOM are normal. Pupils are equal, round, and reactive to light.  Cardiovascular: Normal rate and regular rhythm.  Exam reveals no gallop and no friction rub.   No murmur heard. Pulmonary/Chest: Effort normal and breath sounds normal. No respiratory distress. She has no wheezes. She has no rales.  Abdominal: Soft. Bowel sounds are normal. She exhibits no distension. There is no tenderness.  Musculoskeletal: Normal range of motion. She exhibits no edema and no tenderness.  Lymphadenopathy:    She has no cervical adenopathy.  Neurological: She is alert and oriented to person, place, and time. She displays normal reflexes. No cranial nerve deficit. Coordination normal.  Strength and sensation equal and intact upper and lower extremities; no tremor.  Skin: Skin is warm. She is not diaphoretic.  Psychiatric: She has a normal mood and affect. Her behavior is normal.  Assessment & Plan:  Please see problem based assessment and plan.

## 2013-08-12 NOTE — Patient Instructions (Signed)
1. Please stop taking glipizide.  Keep taking Metformin 1000mg  BID.    2. Someone will call you regarding your EEG.   2. Please take all medications as prescribed.     3. If you have worsening of your symptoms or new symptoms arise, please call the clinic [phone]), or go to the ER immediately if symptoms are severe.

## 2013-08-13 ENCOUNTER — Other Ambulatory Visit: Payer: Self-pay | Admitting: Internal Medicine

## 2013-08-13 DIAGNOSIS — G9340 Encephalopathy, unspecified: Secondary | ICD-10-CM

## 2013-08-13 NOTE — Assessment & Plan Note (Signed)
Lab Results  Component Value Date   HGBA1C 5.4 08/12/2013   HGBA1C 9.0 04/14/2013   HGBA1C 9.9 12/16/2012     Assessment: Diabetes control:  well controlled Progress toward A1C goal:   at goal Comments: Her HgbA1c has drastically improved in the past four months likely 2/2 to decreased po due to stress/depression because her kids were removed from her home.  Hypoglycemia likely precipitated the first syncopal episode (CBG was 40).  Plan: Medications:  Stop glipizide due to hypoglycemic event and normal A1c.  Continue metformin 1000mg  BID for now. Instruction/counseling given: reminded to get eye exam, reminded to bring blood glucose meter & log to each visit and discussed diet Other plans: retinal scan of other eye today (only one eye scanned last time)

## 2013-08-13 NOTE — Assessment & Plan Note (Signed)
Events occurred while standing, did not involve chest pain or palpitations, no EKG changes making cardiac etiology less likely.  The first syncope/seizure event was likely precipitated by hypoglycemia.  The second event involved hypotension and the patient reports she has not been eating well since her kids were removed from her custody.  She denies hx of EtOH w/d seizure.  Mildly hyponatremic in ED but this appears to be chronic.  VSS and no tremor to suggest w/d today.  Second event possibly stress-induced NES vs dehydration causing orthostasis and syncope vs EtOH withdrawal sz.  - ED referred for EEG - patient encouraged to get this test - repeat CMP today - She was encouraged to eat more. - She was encouraged to seek counseling for stress related to loss of custody, she does not seem interested at this time, she has family support  - glipizide discontinued

## 2013-08-16 ENCOUNTER — Other Ambulatory Visit: Payer: Self-pay | Admitting: *Deleted

## 2013-08-16 MED ORDER — LISINOPRIL-HYDROCHLOROTHIAZIDE 10-12.5 MG PO TABS: 1.0000 | ORAL_TABLET | Freq: Every day | ORAL | Status: DC

## 2013-08-16 MED ORDER — METFORMIN HCL 1000 MG PO TABS: 1000.0000 mg | ORAL_TABLET | Freq: Two times a day (BID) | ORAL | Status: DC

## 2013-08-16 MED ORDER — PRAVASTATIN SODIUM 40 MG PO TABS: 40.0000 mg | ORAL_TABLET | Freq: Every day | ORAL | Status: DC

## 2013-08-16 NOTE — Progress Notes (Signed)
Case discussed with Dr. Wilson at the time of the visit.  We reviewed the resident's history and exam and pertinent patient test results.  I agree with the assessment, diagnosis, and plan of care documented in the resident's note. 

## 2013-08-16 NOTE — Telephone Encounter (Signed)
She should not be on losartan-hctz if she is taking lisinopril HCTZ.  I will hold off on restarting citalopram until I see her again.

## 2013-08-17 ENCOUNTER — Ambulatory Visit: Payer: Self-pay

## 2013-08-25 ENCOUNTER — Telehealth: Payer: Self-pay | Admitting: Internal Medicine

## 2013-08-25 NOTE — Telephone Encounter (Signed)
   Reason for call:   I received a call from [name] at 630  AM indicating that she needed to speak to a physician immediately to discuss her "diseases." She then began reading from her AVS from her visit with Dr. Andrey Wilkinson and said that someone from the office needed to explain to her about her kidney disease. Pt is totally asymptomatic at this time.   Pertinent Data:   revieweed chart   Assessment / Plan / Recommendations:   Placed a note to front desk to schedule an appt to discuss medical problems.   As always, pt is advised that if symptoms worsen or new symptoms arise, they should go to an urgent care facility or to to ER for further evaluation.   Carrie BameNora Dohn Stclair, MD   08/25/2013, 6:46 AM

## 2013-08-26 ENCOUNTER — Ambulatory Visit: Payer: Self-pay | Admitting: Internal Medicine

## 2013-08-31 ENCOUNTER — Encounter: Payer: Self-pay | Admitting: Internal Medicine

## 2013-08-31 ENCOUNTER — Ambulatory Visit (INDEPENDENT_AMBULATORY_CARE_PROVIDER_SITE_OTHER): Payer: Self-pay | Admitting: Internal Medicine

## 2013-08-31 VITALS — BP 103/71 | HR 65 | Temp 98.6°F | Ht 64.0 in | Wt 155.3 lb

## 2013-08-31 DIAGNOSIS — I10 Essential (primary) hypertension: Secondary | ICD-10-CM

## 2013-08-31 DIAGNOSIS — R748 Abnormal levels of other serum enzymes: Secondary | ICD-10-CM

## 2013-08-31 DIAGNOSIS — F1994 Other psychoactive substance use, unspecified with psychoactive substance-induced mood disorder: Secondary | ICD-10-CM

## 2013-08-31 DIAGNOSIS — E1165 Type 2 diabetes mellitus with hyperglycemia: Secondary | ICD-10-CM

## 2013-08-31 DIAGNOSIS — N181 Chronic kidney disease, stage 1: Secondary | ICD-10-CM

## 2013-08-31 DIAGNOSIS — E1129 Type 2 diabetes mellitus with other diabetic kidney complication: Secondary | ICD-10-CM

## 2013-08-31 DIAGNOSIS — IMO0002 Reserved for concepts with insufficient information to code with codable children: Secondary | ICD-10-CM

## 2013-08-31 DIAGNOSIS — E1122 Type 2 diabetes mellitus with diabetic chronic kidney disease: Secondary | ICD-10-CM

## 2013-08-31 LAB — GLUCOSE, CAPILLARY: GLUCOSE-CAPILLARY: 84 mg/dL (ref 70–99)

## 2013-08-31 MED ORDER — METFORMIN HCL 500 MG PO TABS: 500.0000 mg | ORAL_TABLET | Freq: Two times a day (BID) | ORAL | Status: DC

## 2013-08-31 MED ORDER — LISINOPRIL 10 MG PO TABS: 10.0000 mg | ORAL_TABLET | Freq: Every day | ORAL | Status: DC

## 2013-08-31 NOTE — Assessment & Plan Note (Signed)
Pt requesting refill for zoloft but doesn't appear that we have ever filled this prescription for the patient.  [facility]

## 2013-08-31 NOTE — Assessment & Plan Note (Signed)
-  recheck CMP next visit

## 2013-08-31 NOTE — Progress Notes (Signed)
Patient ID: [name], female   DOB: 1965/12/18, 48 y.o.   MRN: [id number]    Subjective:   Patient ID: [name] female    DOB: 03-Dec-1965 48 y.o.    MRN: [id number] Health Maintenance Due: Health Maintenance Due  Topic Date Due  . Ophthalmology Exam  06/26/1976  . Tetanus/tdap  06/26/1985  . Influenza Vaccine  08/21/2013    ________________________________________________________________  HPI: [name] is a 48 y.o. female here for an acute visit to discuss "diseases."  Pt has a PMH outlined below.  Please see problem-based charting assessment and plan note for further details of medical issues addressed at today's visit.  PMH: Past Medical History  Diagnosis Date  . Tuberculosis     was treated  . Gonorrhea   . Chlamydia   . Breast abscess   . Substance abuse     alcohol  . Recurrent upper respiratory infection (URI)     URI/SOB-treated with antibiotics-cleared up now  . Depression     PTSD also  . Anemia     when in late teens  . Diabetes mellitus without complication   . Hypertension   . Hypercholesteremia     Medications: Current Outpatient Prescriptions on File Prior to Visit  Medication Sig Dispense Refill  . gabapentin (NEURONTIN) 100 MG capsule Take 1 capsule (100 mg total) by mouth 3 (three) times daily.  90 capsule  2  . lisinopril-hydrochlorothiazide (PRINZIDE,ZESTORETIC) 10-12.5 MG per tablet Take 1 tablet by mouth daily. For hypertension  30 tablet  1  . metFORMIN (GLUCOPHAGE) 1000 MG tablet Take 1 tablet (1,000 mg total) by mouth 2 (two) times daily.  60 tablet  1  . pravastatin (PRAVACHOL) 40 MG tablet Take 1 tablet (40 mg total) by mouth daily.  30 tablet  5  . sertraline (ZOLOFT) 50 MG tablet Take 50 mg by mouth daily.      . traZODone (DESYREL) 50 MG tablet Take 1 tablet (50 mg total) by mouth at bedtime and may repeat dose one time if needed. For sleep  60 tablet  0   No current facility-administered  medications on file prior to visit.    Allergies: No Known Allergies  FH: Family History  Problem Relation Age of Onset  . Cancer Mother     breast  . Cancer Sister     breast  . Cancer Paternal Aunt     breast    SH: History   Social History  . Marital Status: Single    Spouse Name: N/A    Number of Children: N/A  . Years of Education: GED 1987   Occupational History  . unemployed    Social History Main Topics  . Smoking status: Former Smoker -- 0.50 packs/day for 30 years    Types: Cigarettes    Quit date: 12/11/2012  . Smokeless tobacco: Never Used     Comment: None in 5 days  . Alcohol Use: No     Comment: Stopped x 5 days.  . Drug Use: No     Comment: sober for 11 yrs  . Sexual Activity: Not on file   Other Topics Concern  . Not on file   Social History Narrative  . No narrative on file    Review of Systems: Constitutional: Negative for fever, chills and weight loss.  Eyes: Negative for blurred vision.  Respiratory: Negative for cough and shortness of breath.  Cardiovascular: Negative for chest pain, palpitations and leg swelling.  Gastrointestinal: Negative for nausea, vomiting, abdominal pain, diarrhea, constipation and blood in stool.  Genitourinary: Negative for dysuria, urgency and frequency.  Musculoskeletal: Negative for myalgias and back pain.  Neurological: Negative for dizziness, weakness and headaches.     Objective:   Vital Signs: There were no vitals filed for this visit.    BP Readings from Last 3 Encounters:  08/12/13 125/85  08/10/13 116/74  07/06/13 133/77    Physical Exam: Constitutional: Vital signs reviewed.  Patient is well-developed and well-nourished in NAD and cooperative with exam.  Head: Normocephalic and atraumatic. Eyes: PERRL, EOMI, conjunctivae nl, no scleral icterus.  Neck: Supple. Cardiovascular: RRR, no MRG. Pulmonary/Chest: normal effort, non-tender to palpation, CTAB, no wheezes, rales, or  rhonchi. Abdominal: Soft. NT/ND +BS. Neurological: A&O x3, cranial nerves II-XII are grossly intact, moving all extremities. Extremities: 2+DP b/l; no pitting edema. Skin: Warm, dry and intact. No rash.  Most Recent Laboratory Results:  CMP     Component Value Date/Time   NA 128* 08/12/2013 1437   K 3.4* 08/12/2013 1437   CL 91* 08/12/2013 1437   CO2 27 08/12/2013 1437   GLUCOSE 108* 08/12/2013 1437   BUN 7 08/12/2013 1437   CREATININE 0.59 08/12/2013 1437   CREATININE 0.69 08/10/2013 1311   CALCIUM 9.3 08/12/2013 1437   PROT 7.5 08/12/2013 1437   ALBUMIN 4.2 08/12/2013 1437   AST 136* 08/12/2013 1437   ALT 112* 08/12/2013 1437   ALKPHOS 95 08/12/2013 1437   BILITOT 1.2 08/12/2013 1437   GFRNONAA >89 08/12/2013 1437   GFRNONAA >90 08/10/2013 1311   GFRAA >89 08/12/2013 1437   GFRAA >90 08/10/2013 1311    CBC    Component Value Date/Time   WBC 7.5 08/10/2013 1311   RBC 4.62 08/10/2013 1311   HGB 13.5 08/10/2013 1311   HCT 39.0 08/10/2013 1311   PLT 122* 08/10/2013 1311   MCV 84.4 08/10/2013 1311   MCH 29.2 08/10/2013 1311   MCHC 34.6 08/10/2013 1311   RDW 14.7 08/10/2013 1311   LYMPHSABS 0.8 08/10/2013 1311   MONOABS 0.4 08/10/2013 1311   EOSABS 0.1 08/10/2013 1311   BASOSABS 0.0 08/10/2013 1311    Lipid Panel Lab Results  Component Value Date   CHOL 144 04/14/2013   HDL 42 04/14/2013   LDLCALC 73 04/14/2013   TRIG 145 04/14/2013   CHOLHDL 3.4 04/14/2013    HA1C Lab Results  Component Value Date   HGBA1C 5.4 08/12/2013    Urinalysis    Component Value Date/Time   COLORURINE AMBER* 08/10/2013 1358   APPEARANCEUR CLOUDY* 08/10/2013 1358   LABSPEC 1.015 08/10/2013 1358   PHURINE 5.5 08/10/2013 1358   GLUCOSEU NEGATIVE 08/10/2013 1358   HGBUR NEGATIVE 08/10/2013 1358   BILIRUBINUR SMALL* 08/10/2013 1358   KETONESUR 15* 08/10/2013 1358   PROTEINUR 100* 08/10/2013 1358   UROBILINOGEN 1.0 08/10/2013 1358   NITRITE NEGATIVE 08/10/2013 1358   LEUKOCYTESUR SMALL* 08/10/2013 1358    Urine  Microalbumin Lab Results  Component Value Date   MICROALBUR 1.36 04/14/2013    Imaging N/A   Assessment & Plan:   Assessment and plan was discussed and formulated with my attending.

## 2013-08-31 NOTE — Assessment & Plan Note (Addendum)
Pt seen during last OV for presyncopal/syncopal episode?  Likely etiology was thought to be due to hypoglycemic episode vs. seizure.  She was scheduled to have an EEG but has not been done.  Today, pt denies any symptoms of hypoglycemia.  Denies any more previous episode of hypoglycemia.  No records of hypoglycemia in the [facility]--this was reported by EMS prior to pt arriving in ED in previous notes.  Pt taken off glipizide during last visit so I doubt she would be having hypoglycemic episodes anyway.  She has not been checking her blood sugars since off glipizide but there is really no indication to check cbgs if only on metformin and no other agents that could contribute to low blood sugars.  However, she smells of alcohol today and it is possible that her hypoglycemic episodes occur in the context of her drinking and fasting since alcohol is known to be a risk factor for hypoglycemia.   Pt also reports she has lot a lot of weight since she was diagnosed with DM and going forward she may not even require metformin.  CBG today was 84 but she had not eaten anything since 11AM.   -would decrease metformin to 500mg  bid (from 1000mg  bid) given last HA1c at goal  -given information on low blood sugar precautions and diabetes management  -retinal scan on hold until pt gets orange card  -return to clinic in 2-3 weeks to recheck cbg

## 2013-08-31 NOTE — Patient Instructions (Addendum)
Thank you for your visit today.   Please return to the internal medicine clinic in 2-3 weeks to recheck your blood sugar and blood pressure.     I have made the following additions/changes to your medications:  1. decreased metformin to 500mg  twice daily, and discontinue zestoretic 2. added lisinopril 10mg  daily instead of zestoretic   I have sent your medication refills to your pharmacy.   Please be sure to bring all of your medications with you to every visit; this includes herbal supplements, vitamins, eye drops, and any over-the-counter medications.   Should you have any questions regarding your medications and/or any new or worsening symptoms, please be sure to call the clinic at [phone].   If you believe that you are suffering from a life threatening condition or one that may result in the loss of limb or function, then you should call 911 or proceed to the nearest Emergency Department.    Hypoglycemia Hypoglycemia occurs when the glucose in your blood is too low. Glucose is a type of sugar that is your body's main energy source. Hormones, such as insulin and glucagon, control the level of glucose in the blood. Insulin lowers blood glucose and glucagon increases blood glucose. Having too much insulin in your blood stream, or not eating enough food containing sugar, can result in hypoglycemia. Hypoglycemia can happen to people with or without diabetes. It can develop quickly and can be a medical emergency.  CAUSES   Missing or delaying meals.  Not eating enough carbohydrates at meals.  Taking too much diabetes medicine.  Not timing your oral diabetes medicine or insulin doses with meals, snacks, and exercise.  Nausea and vomiting.  Certain medicines.  Severe illnesses, such as hepatitis, kidney disorders, and certain eating disorders.  Increased activity or exercise without eating something extra or adjusting medicines.  Drinking too much alcohol.  A nerve disorder  that affects body functions like your heart rate, blood pressure, and digestion (autonomic neuropathy).  A condition where the stomach muscles do not function properly (gastroparesis). Therefore, medicines and food may not absorb properly.  Rarely, a tumor of the pancreas can produce too much insulin. SYMPTOMS   Hunger.  Sweating (diaphoresis).  Change in body temperature.  Shakiness.  Headache.  Anxiety.  Lightheadedness.  Irritability.  Difficulty concentrating.  Dry mouth.  Tingling or numbness in the hands or feet.  Restless sleep or sleep disturbances.  Altered speech and coordination.  Change in mental status.  Seizures or prolonged convulsions.  Combativeness.  Drowsiness (lethargic).  Weakness.  Increased heart rate or palpitations.  Confusion.  Pale, gray skin color.  Blurred or double vision.  Fainting. DIAGNOSIS  A physical exam and medical history will be performed. Your caregiver may make a diagnosis based on your symptoms. Blood tests and other lab tests may be performed to confirm a diagnosis. Once the diagnosis is made, your caregiver will see if your signs and symptoms go away once your blood glucose is raised.  TREATMENT  Usually, you can easily treat your hypoglycemia when you notice symptoms.  Check your blood glucose. If it is less than 70 mg/dl, take one of the following:   3-4 glucose tablets.    cup juice.    cup regular soda.   1 cup skim milk.   -1 tube of glucose gel.   5-6 hard candies.   Avoid high-fat drinks or food that may delay a rise in blood glucose levels.  Do not take more than the  recommended amount of sugary foods, drinks, gel, or tablets. Doing so will cause your blood glucose to go too high.   Wait 10-15 minutes and recheck your blood glucose. If it is still less than 70 mg/dl or below your target range, repeat treatment.   Eat a snack if it is more than 1 hour until your next meal.   There may be a time when your blood glucose may go so low that you are unable to treat yourself at home when you start to notice symptoms. You may need someone to help you. You may even faint or be unable to swallow. If you cannot treat yourself, someone will need to bring you to the hospital.  HOME CARE INSTRUCTIONS  If you have diabetes, follow your diabetes management plan by:  Taking your medicines as directed.  Following your exercise plan.  Following your meal plan. Do not skip meals. Eat on time.  Testing your blood glucose regularly. Check your blood glucose before and after exercise. If you exercise longer or different than usual, be sure to check blood glucose more frequently.  Wearing your medical alert jewelry that says you have diabetes.  Identify the cause of your hypoglycemia. Then, develop ways to prevent the recurrence of hypoglycemia.  Do not take a hot bath or shower right after an insulin shot.  Always carry treatment with you. Glucose tablets are the easiest to carry.  If you are going to drink alcohol, drink it only with meals.  Tell friends or family members ways to keep you safe during a seizure. This may include removing hard or sharp objects from the area or turning you on your side.  Maintain a healthy weight. SEEK MEDICAL CARE IF:   You are having problems keeping your blood glucose in your target range.  You are having frequent episodes of hypoglycemia.  You feel you might be having side effects from your medicines.  You are not sure why your blood glucose is dropping so low.  You notice a change in vision or a new problem with your vision. SEEK IMMEDIATE MEDICAL CARE IF:   Confusion develops.  A change in mental status occurs.  The inability to swallow develops.  Fainting occurs. Document Released: 01/07/2005 Document Revised: 01/12/2013 Document Reviewed: 05/06/2011 [facility] Patient Information 2015 [place], [place]. This information is  not intended to replace advice given to you by your health care provider. Make sure you discuss any questions you have with your health care provider.

## 2013-08-31 NOTE — Progress Notes (Signed)
Patient ID: [name], female   DOB: 05/08/1965, 48 y.o.   MRN: [id number]  Case discussed with Dr. Delane GingerGill at the time of the visit.  We reviewed the resident's history and exam and pertinent patient test results.  I agree with the assessment, diagnosis, and plan of care documented in the resident's note.  Basically, we discussed that Dr Delane GingerGill would discuss the possibility of reducing the dose of metformin to 500 BID, discuss healthy eating and taking three meals a day, provide education for hypoglycemia. We will also try taking hydroclorthiazide away given hyponatremia and borderline low potassium for the last three blood work results. The patient will come back in 2 weeks to review BMP and hypoglycemia.

## 2013-08-31 NOTE — Assessment & Plan Note (Addendum)
BP today is lower than usual, 103/71. No symptoms. -would d/c HCTZ due to chronic hyponatremia (also likely not salt sensitive) -continue lisinopril 10mg  daily  -return to clinic to recheck BP and CMP 2-3 weeks

## 2013-09-08 ENCOUNTER — Emergency Department (HOSPITAL_COMMUNITY): Payer: Self-pay

## 2013-09-08 ENCOUNTER — Encounter (HOSPITAL_COMMUNITY): Payer: Self-pay | Admitting: Emergency Medicine

## 2013-09-08 ENCOUNTER — Emergency Department (HOSPITAL_COMMUNITY)
Admission: EM | Admit: 2013-09-08 | Discharge: 2013-09-08 | Disposition: A | Discharge status: Home / Self Care | Payer: Self-pay | Attending: Emergency Medicine | Admitting: Emergency Medicine

## 2013-09-08 DIAGNOSIS — Z8611 Personal history of tuberculosis: Secondary | ICD-10-CM | POA: Insufficient documentation

## 2013-09-08 DIAGNOSIS — Z8619 Personal history of other infectious and parasitic diseases: Secondary | ICD-10-CM | POA: Insufficient documentation

## 2013-09-08 DIAGNOSIS — Z791 Long term (current) use of non-steroidal anti-inflammatories (NSAID): Secondary | ICD-10-CM | POA: Insufficient documentation

## 2013-09-08 DIAGNOSIS — E785 Hyperlipidemia, unspecified: Secondary | ICD-10-CM | POA: Insufficient documentation

## 2013-09-08 DIAGNOSIS — Z862 Personal history of diseases of the blood and blood-forming organs and certain disorders involving the immune mechanism: Secondary | ICD-10-CM | POA: Insufficient documentation

## 2013-09-08 DIAGNOSIS — Z79899 Other long term (current) drug therapy: Secondary | ICD-10-CM | POA: Insufficient documentation

## 2013-09-08 DIAGNOSIS — J069 Acute upper respiratory infection, unspecified: Secondary | ICD-10-CM | POA: Insufficient documentation

## 2013-09-08 DIAGNOSIS — R0789 Other chest pain: Secondary | ICD-10-CM | POA: Insufficient documentation

## 2013-09-08 DIAGNOSIS — R079 Chest pain, unspecified: Secondary | ICD-10-CM | POA: Insufficient documentation

## 2013-09-08 DIAGNOSIS — Z87891 Personal history of nicotine dependence: Secondary | ICD-10-CM | POA: Insufficient documentation

## 2013-09-08 DIAGNOSIS — F3289 Other specified depressive episodes: Secondary | ICD-10-CM | POA: Insufficient documentation

## 2013-09-08 DIAGNOSIS — F329 Major depressive disorder, single episode, unspecified: Secondary | ICD-10-CM | POA: Insufficient documentation

## 2013-09-08 DIAGNOSIS — Z872 Personal history of diseases of the skin and subcutaneous tissue: Secondary | ICD-10-CM | POA: Insufficient documentation

## 2013-09-08 DIAGNOSIS — E119 Type 2 diabetes mellitus without complications: Secondary | ICD-10-CM | POA: Insufficient documentation

## 2013-09-08 DIAGNOSIS — I10 Essential (primary) hypertension: Secondary | ICD-10-CM | POA: Insufficient documentation

## 2013-09-08 LAB — CBC
HEMATOCRIT: 37.5 % (ref 36.0–46.0)
Hemoglobin: 12.7 g/dL (ref 12.0–15.0)
MCH: 29.6 pg (ref 26.0–34.0)
MCHC: 33.9 g/dL (ref 30.0–36.0)
MCV: 87.4 fL (ref 78.0–100.0)
Platelets: 433 10*3/uL — ABNORMAL HIGH (ref 150–400)
RBC: 4.29 MIL/uL (ref 3.87–5.11)
RDW: 13.9 % (ref 11.5–15.5)
WBC: 9.2 10*3/uL (ref 4.0–10.5)

## 2013-09-08 LAB — I-STAT TROPONIN, ED
Troponin i, poc: 0 ng/mL (ref 0.00–0.08)
Troponin i, poc: 0 ng/mL (ref 0.00–0.08)

## 2013-09-08 LAB — BASIC METABOLIC PANEL
Anion gap: 15 (ref 5–15)
BUN: 10 mg/dL (ref 6–23)
CHLORIDE: 103 meq/L (ref 96–112)
CO2: 23 meq/L (ref 19–32)
Calcium: 9.4 mg/dL (ref 8.4–10.5)
Creatinine, Ser: 0.54 mg/dL (ref 0.50–1.10)
GFR calc non Af Amer: >90 mL/min (ref >90)
Glucose, Bld: 137 mg/dL — ABNORMAL HIGH (ref 70–99)
Potassium: 3.6 mEq/L — ABNORMAL LOW (ref 3.7–5.3)
Sodium: 141 mEq/L (ref 137–147)

## 2013-09-08 MED ORDER — GI COCKTAIL ~~LOC~~
30.0000 mL | Freq: Once | ORAL | Status: AC
  Administered 2013-09-08: 30 mL via ORAL
  Filled 2013-09-08: qty 30

## 2013-09-08 MED ORDER — ALBUTEROL SULFATE HFA 108 (90 BASE) MCG/ACT IN AERS: 2.0000 | INHALATION_SPRAY | Freq: Four times a day (QID) | RESPIRATORY_TRACT | Status: DC | PRN

## 2013-09-08 NOTE — ED Notes (Signed)
Pt c/o left sided CP intermittently x 3 days that is worse with movement and inspiration

## 2013-09-08 NOTE — ED Provider Notes (Signed)
CSN: [id number]     Arrival date & time 09/08/13  1750 History   First MD Initiated Contact with Patient 09/08/13 2146     Chief Complaint  Patient presents with  . Chest Pain     (Consider location/radiation/quality/duration/timing/severity/associated sxs/prior Treatment) HPI Comments: Patient is a 48 yo F PMHx significant DM, HTN, Hypercholesteremia, hx of substance abuse presenting to the ED for three days of intermittent left sided chest pain. Patient describes the pain as sharp non-radiating that lasts a couple of seconds at a time. Pain is aggravated with movement, inspiration, and coughing. Patient endorses associated nasal congestion, rhinorrhea, and non-productive cough prior to the onset of chest pain. Patient is also complaining of increased belching, but also endorses she has been drinking increased amount of carbonated beverages, which she notes aggravates her reflux. PERC negative. No known early onset familial cardiac history.   Patient is a 48 y.o. female presenting with chest pain.  Chest Pain Associated symptoms: cough   Associated symptoms: no diaphoresis, no fever, no nausea, no shortness of breath and not vomiting     Past Medical History  Diagnosis Date  . Tuberculosis     was treated  . Gonorrhea   . Chlamydia   . Breast abscess   . Substance abuse     alcohol  . Recurrent upper respiratory infection (URI)     URI/SOB-treated with antibiotics-cleared up now  . Depression     PTSD also  . Anemia     when in late teens  . Diabetes mellitus without complication   . Hypertension   . Hypercholesteremia    Past Surgical History  Procedure Laterality Date  . Tongue surgery  2008    lump removed  . Incise and drain abcess  01/03/11    breast abscess  . Breast lumpectomy     Family History  Problem Relation Age of Onset  . Cancer Mother     breast  . Cancer Sister     breast  . Cancer Paternal Aunt     breast   History  Substance Use Topics  .  Smoking status: Former Smoker -- 0.50 packs/day for 30 years    Types: Cigarettes    Quit date: 12/11/2012  . Smokeless tobacco: Never Used     Comment: None in 5 days  . Alcohol Use: No     Comment: Stopped x 5 days.   OB History   Grav Para Term Preterm Abortions TAB SAB Ect Mult Living   2 2 2       2      Review of Systems  Constitutional: Positive for chills. Negative for fever and diaphoresis.  HENT: Positive for congestion and rhinorrhea.   Respiratory: Positive for cough. Negative for shortness of breath.   Cardiovascular: Positive for chest pain. Negative for leg swelling.  Gastrointestinal: Negative for nausea and vomiting.  Neurological: Negative for syncope.  All other systems reviewed and are negative.     Allergies  Review of patient's allergies indicates no known allergies.  Home Medications   Prior to Admission medications   Medication Sig Start Date End Date Taking? Authorizing Provider  acetaminophen (TYLENOL) 500 MG tablet Take 1,000 mg by mouth every 6 (six) hours as needed for mild pain.   Yes Historical Provider, MD  gabapentin (NEURONTIN) 100 MG capsule Take 1 capsule (100 mg total) by mouth 3 (three) times daily. 04/14/13 04/14/14 Yes Gust Rung, DO  lisinopril (PRINIVIL,ZESTRIL) 10 MG tablet Take  1 tablet (10 mg total) by mouth daily. 08/31/13  Yes Marrian SalvageJacquelyn S Gill, MD  metFORMIN (GLUCOPHAGE) 500 MG tablet Take 1 tablet (500 mg total) by mouth 2 (two) times daily with a meal. 08/31/13 08/31/14 Yes Marrian SalvageJacquelyn S Gill, MD  naproxen sodium (ANAPROX) 220 MG tablet Take 220 mg by mouth daily as needed (pain).   Yes Historical Provider, MD  pravastatin (PRAVACHOL) 40 MG tablet Take 1 tablet (40 mg total) by mouth daily. 08/16/13  Yes Gust RungErik C Hoffman, DO  sertraline (ZOLOFT) 50 MG tablet Take 50 mg by mouth daily.   Yes Historical Provider, MD  traZODone (DESYREL) 50 MG tablet Take 1 tablet (50 mg total) by mouth at bedtime and may repeat dose one time if needed.  For sleep 03/24/13  Yes Sanjuana KavaAgnes I Nwoko, NP  albuterol (PROVENTIL HFA;VENTOLIN HFA) 108 (90 BASE) MCG/ACT inhaler Inhale 2 puffs into the lungs every 6 (six) hours as needed for wheezing or shortness of breath. 09/08/13   Xitlalli Newhard L Mabrey Howland, PA-C   BP 149/87  Pulse 53  Temp(Src) 98.1 F (36.7 C) (Oral)  Resp 22  SpO2 98%  LMP 01/22/2013 Physical Exam  Nursing note and vitals reviewed. Constitutional: She is oriented to person, place, and time. She appears well-developed and well-nourished. No distress.  HENT:  Head: Normocephalic and atraumatic.  Right Ear: External ear normal.  Left Ear: External ear normal.  Nose: Nose normal.  Mouth/Throat: Oropharynx is clear and moist. No oropharyngeal exudate.  Eyes: Conjunctivae are normal.  Neck: Neck supple.  Cardiovascular: Normal rate, regular rhythm and normal heart sounds.   Pulmonary/Chest: Effort normal and breath sounds normal. No respiratory distress. She has no wheezes. She has no rales. She exhibits tenderness. Right breast exhibits no skin change. Left breast exhibits no skin change.    Abdominal: Soft. There is no tenderness.  Musculoskeletal: Normal range of motion. She exhibits no edema.  Neurological: She is alert and oriented to person, place, and time.  Skin: Skin is warm and dry. She is not diaphoretic.    ED Course  Procedures (including critical care time) Medications  gi cocktail (Maalox,Lidocaine,Donnatal) (30 mLs Oral Given 09/08/13 2304)    Labs Review Labs Reviewed  CBC - Abnormal; Notable for the following:    Platelets 433 (*)    All other components within normal limits  BASIC METABOLIC PANEL - Abnormal; Notable for the following:    Potassium 3.6 (*)    Glucose, Bld 137 (*)    All other components within normal limits  I-STAT TROPOININ, ED  Rosezena SensorI-STAT TROPOININ, ED    Imaging Review Dg Chest 2 View  09/08/2013   CLINICAL DATA:  Chest pain  EXAM: CHEST  2 VIEW  COMPARISON:  12/9/ 13  FINDINGS:  Cardiomediastinal silhouette is stable. No acute infiltrate or pleural effusion. No pulmonary edema. Bony thorax is unremarkable.  IMPRESSION: No active cardiopulmonary disease.   Electronically Signed   By: Natasha MeadLiviu  Pop M.D.   On: 09/08/2013 18:49     EKG Interpretation None      Heart Score 2  MDM   Final diagnoses:  Chest pain, atypical  URI (upper respiratory infection)    Filed Vitals:   09/08/13 2126  BP: 149/87  Pulse: 53  Temp:   Resp: 22   Afebrile, NAD, non-toxic appearing, AAOx4. I have reviewed nursing notes, vital signs, and all appropriate lab and imaging results for this patient. Patient is to be discharged with recommendation to follow up with PCP  in regards to today's hospital visit. Chest pain is not likely of cardiac or pulmonary etiology d/t presentation, perc negative, VSS, no tracheal deviation, no JVD or new murmur, RRR, breath sounds equal bilaterally, EKG without acute abnormalities, negative delta troponin, and negative CXR. Symptoms correlated with URI like symptoms.Pt has been advised to return to the ED is CP becomes exertional, associated with diaphoresis or nausea, radiates to left jaw/arm, worsens or becomes concerning in any way. Pt appears reliable for follow up and is agreeable to discharge.   Case has been discussed with and seen by Dr. Lynelle Doctor who agrees with the above plan to discharge.     Patient d/w with Dr. Lynelle Doctor, agrees with plan.      Jeannetta Ellis, PA-C 09/09/13 [phone]

## 2013-09-08 NOTE — ED Notes (Signed)
Pt placed into gown and on monitor upon arrival to room. Pt monitored by blood pressure, pulse ox, and 5 lead.  

## 2013-09-08 NOTE — Discharge Instructions (Signed)
Please follow up with your primary care physician in 1-2 days. If you do not have one please call the [facility] and wellness Center number listed above. Please use inhalers 2 puff every four to six hours for cough. Please read all discharge instructions and return precautions.    Chest Pain (Nonspecific) It is often hard to give a specific diagnosis for the cause of chest pain. There is always a chance that your pain could be related to something serious, such as a heart attack or a blood clot in the lungs. You need to follow up with your health care provider for further evaluation. CAUSES   Heartburn.  Pneumonia or bronchitis.  Anxiety or stress.  Inflammation around your heart (pericarditis) or lung (pleuritis or pleurisy).  A blood clot in the lung.  A collapsed lung (pneumothorax). It can develop suddenly on its own (spontaneous pneumothorax) or from trauma to the chest.  Shingles infection (herpes zoster virus). The chest wall is composed of bones, muscles, and cartilage. Any of these can be the source of the pain.  The bones can be bruised by injury.  The muscles or cartilage can be strained by coughing or overwork.  The cartilage can be affected by inflammation and become sore (costochondritis). DIAGNOSIS  Lab tests or other studies may be needed to find the cause of your pain. Your health care provider may have you take a test called an ambulatory electrocardiogram (ECG). An ECG records your heartbeat patterns over a 24-hour period. You may also have other tests, such as:  Transthoracic echocardiogram (TTE). During echocardiography, sound waves are used to evaluate how blood flows through your heart.  Transesophageal echocardiogram (TEE).  Cardiac monitoring. This allows your health care provider to monitor your heart rate and rhythm in real time.  Holter monitor. This is a portable device that records your heartbeat and can help diagnose heart arrhythmias. It allows  your health care provider to track your heart activity for several days, if needed.  Stress tests by exercise or by giving medicine that makes the heart beat faster. TREATMENT   Treatment depends on what may be causing your chest pain. Treatment may include:  Acid blockers for heartburn.  Anti-inflammatory medicine.  Pain medicine for inflammatory conditions.  Antibiotics if an infection is present.  You may be advised to change lifestyle habits. This includes stopping smoking and avoiding alcohol, caffeine, and chocolate.  You may be advised to keep your head raised (elevated) when sleeping. This reduces the chance of acid going backward from your stomach into your esophagus. Most of the time, nonspecific chest pain will improve within 2-3 days with rest and mild pain medicine.  HOME CARE INSTRUCTIONS   If antibiotics were prescribed, take them as directed. Finish them even if you start to feel better.  For the next few days, avoid physical activities that bring on chest pain. Continue physical activities as directed.  Do not use any tobacco products, including cigarettes, chewing tobacco, or electronic cigarettes.  Avoid drinking alcohol.  Only take medicine as directed by your health care provider.  Follow your health care provider's suggestions for further testing if your chest pain does not go away.  Keep any follow-up appointments you made. If you do not go to an appointment, you could develop lasting (chronic) problems with pain. If there is any problem keeping an appointment, call to reschedule. SEEK MEDICAL CARE IF:   Your chest pain does not go away, even after treatment.  You have  a rash with blisters on your chest.  You have a fever. SEEK IMMEDIATE MEDICAL CARE IF:   You have increased chest pain or pain that spreads to your arm, neck, jaw, back, or abdomen.  You have shortness of breath.  You have an increasing cough, or you cough up blood.  You have  severe back or abdominal pain.  You feel nauseous or vomit.  You have severe weakness.  You faint.  You have chills. This is an emergency. Do not wait to see if the pain will go away. Get medical help at once. Call your local emergency services (911 in [place] Do not drive yourself to the hospital. MAKE SURE YOU:   Understand these instructions.  Will watch your condition.  Will get help right away if you are not doing well or get worse. Document Released: 10/17/2004 Document Revised: 01/12/2013 Document Reviewed: 08/13/2007 [facility] Patient Information 2015 [employer], [place]. This information is not intended to replace advice given to you by your health care provider. Make sure you discuss any questions you have with your health care provider. Upper Respiratory Infection, Adult An upper respiratory infection (URI) is also known as the common cold. It is often caused by a type of germ (virus). Colds are easily spread (contagious). You can pass it to others by kissing, coughing, sneezing, or drinking out of the same glass. Usually, you get better in 1 or 2 weeks.  HOME CARE   Only take medicine as told by your doctor.  Use a warm mist humidifier or breathe in steam from a hot shower.  Drink enough water and fluids to keep your pee (urine) clear or pale yellow.  Get plenty of rest.  Return to work when your temperature is back to normal or as told by your doctor. You may use a face mask and wash your hands to stop your cold from spreading. GET HELP RIGHT AWAY IF:   After the first few days, you feel you are getting worse.  You have questions about your medicine.  You have chills, shortness of breath, or brown or red spit (mucus).  You have yellow or brown snot (nasal discharge) or pain in the face, especially when you bend forward.  You have a fever, puffy (swollen) neck, pain when you swallow, or white spots in the back of your throat.  You have a bad headache, ear pain, sinus  pain, or chest pain.  You have a high-pitched whistling sound when you breathe in and out (wheezing).  You have a lasting cough or cough up blood.  You have sore muscles or a stiff neck. MAKE SURE YOU:   Understand these instructions.  Will watch your condition.  Will get help right away if you are not doing well or get worse. Document Released: 06/26/2007 Document Revised: 04/01/2011 Document Reviewed: 04/14/2013 [facility] Patient Information 2015 [place], [place]. This information is not intended to replace advice given to you by your health care provider. Make sure you discuss any questions you have with your health care provider.

## 2013-09-09 NOTE — ED Provider Notes (Signed)
Medical screening examination/treatment/procedure(s) were performed by non-physician practitioner and as supervising physician I was immediately available for consultation/collaboration.   EKG Interpretation  Date/Time:  Wednesday September 08 2013 17:56:09 EDT Ventricular Rate:  58 PR Interval:  182 QRS Duration: 92 QT Interval:  438 QTC Calculation: 429 R Axis:   31 Text Interpretation:  Sinus bradycardia Incomplete right bundle branch block Borderline ECG No significant change since last tracing Confirmed by Kenndra Morris  MD-J, Berry Godsey ([id number]) on 09/09/2013 8:01:33 PM        Carrie DibblesJon Zealand Boyett, MD 09/09/13 2002

## 2013-09-16 ENCOUNTER — Other Ambulatory Visit: Payer: Self-pay | Admitting: Obstetrics and Gynecology

## 2013-09-16 DIAGNOSIS — Z1231 Encounter for screening mammogram for malignant neoplasm of breast: Secondary | ICD-10-CM

## 2013-09-17 ENCOUNTER — Other Ambulatory Visit: Payer: Self-pay | Admitting: *Deleted

## 2013-09-17 DIAGNOSIS — E1140 Type 2 diabetes mellitus with diabetic neuropathy, unspecified: Secondary | ICD-10-CM

## 2013-09-17 MED ORDER — GABAPENTIN 100 MG PO CAPS: 100.0000 mg | ORAL_CAPSULE | Freq: Three times a day (TID) | ORAL | Status: DC

## 2013-09-28 ENCOUNTER — Ambulatory Visit (HOSPITAL_COMMUNITY): Payer: Self-pay | Attending: Obstetrics and Gynecology

## 2013-09-28 ENCOUNTER — Ambulatory Visit (HOSPITAL_COMMUNITY): Payer: Self-pay

## 2013-10-19 ENCOUNTER — Encounter: Payer: Self-pay | Admitting: *Deleted

## 2013-11-09 ENCOUNTER — Encounter (HOSPITAL_COMMUNITY): Payer: Self-pay

## 2013-11-09 ENCOUNTER — Ambulatory Visit (HOSPITAL_COMMUNITY)
Admission: RE | Admit: 2013-11-09 | Discharge: 2013-11-09 | Disposition: A | Discharge status: Home / Self Care | Payer: Self-pay | Source: Ambulatory Visit | Attending: Obstetrics and Gynecology | Admitting: Obstetrics and Gynecology

## 2013-11-09 VITALS — BP 160/94 | Temp 98.6°F | Ht 63.0 in | Wt 156.6 lb

## 2013-11-09 DIAGNOSIS — Z01419 Encounter for gynecological examination (general) (routine) without abnormal findings: Secondary | ICD-10-CM

## 2013-11-09 DIAGNOSIS — Z1231 Encounter for screening mammogram for malignant neoplasm of breast: Secondary | ICD-10-CM | POA: Insufficient documentation

## 2013-11-09 NOTE — Patient Instructions (Signed)
Explained to Weston Brassharlotte Grewe that [facility] will cover Pap smears and co-testing every 5 years unless has a history of abnormal Pap smears. Let patient know will follow up with her within the next couple weeks with results of Pap smear by phone. Informed [facility] that [facility]up with patient on the results of her mammogram by letter or phone. Weston BrassCharlotte Cardiff verbalized understanding. Patient escorted to mammography for a screening mammogram.

## 2013-11-09 NOTE — Progress Notes (Signed)
No complaints today.  Pap Smear:  Pap smear completed today. [facility]. Per patient has no history of an abnormal Pap smear. Pap smear result above in EPIC.   Physical exam: Breasts Breasts symmetrical. No skin abnormalities right breast. Patient has a scar on her left breast at 5 o'clock next to areola where she had surgery due to left breast abscess. Nipple retraction bilateral breasts per patient is normal for her. No nipple discharge bilateral breasts. No lymphadenopathy. No lumps palpated bilateral breasts. No complaints of pain or tenderness on exam. Patient escorted to mammography for a screening mammogram.      Pelvic/Bimanual   Ext Genitalia No lesions, no swelling and no discharge observed on external genitalia.         Vagina Vagina pink and normal texture. No lesions and small amount of whitish colored discharge observed in vagina.          Cervix Cervix is present. Cervix pink and of normal texture. No discharge observed.     Uterus Uterus is present and palpable. Uterus is retroverted and normal size.        Adnexae Bilateral ovaries present and palpable. No tenderness on palpation.          Rectovaginal No rectal exam completed today since patient had no rectal complaints. No skin abnormalities observed on exam.

## 2013-11-11 LAB — CYTOLOGY - PAP

## 2013-11-12 ENCOUNTER — Telehealth (HOSPITAL_COMMUNITY): Payer: Self-pay | Admitting: *Deleted

## 2013-11-12 ENCOUNTER — Other Ambulatory Visit (HOSPITAL_COMMUNITY): Payer: Self-pay | Admitting: *Deleted

## 2013-11-12 DIAGNOSIS — A599 Trichomoniasis, unspecified: Secondary | ICD-10-CM

## 2013-11-12 MED ORDER — METRONIDAZOLE 500 MG PO TABS: 500.0000 mg | ORAL_TABLET | Freq: Two times a day (BID) | ORAL | Status: DC

## 2013-11-12 NOTE — Telephone Encounter (Signed)
Telephoned patient at home # and left message to return call to [facility] 

## 2013-11-15 ENCOUNTER — Telehealth (HOSPITAL_COMMUNITY): Payer: Self-pay | Admitting: *Deleted

## 2013-11-15 NOTE — Telephone Encounter (Signed)
Telephoned patient at home # and left message with patient's husband [facility]

## 2013-11-17 ENCOUNTER — Telehealth (HOSPITAL_COMMUNITY): Payer: Self-pay | Admitting: *Deleted

## 2013-11-17 NOTE — Telephone Encounter (Signed)
[facility] Advised of negative pap smear results and negative HPV. Patient's pap smear did show trichomonas and med was called into pharmacy. Patient voiced understanding.

## 2013-11-19 ENCOUNTER — Other Ambulatory Visit: Payer: Self-pay

## 2013-11-19 ENCOUNTER — Ambulatory Visit: Payer: Self-pay

## 2013-11-22 ENCOUNTER — Encounter (HOSPITAL_COMMUNITY): Payer: Self-pay

## 2013-11-25 ENCOUNTER — Other Ambulatory Visit: Payer: Self-pay

## 2013-11-25 ENCOUNTER — Ambulatory Visit: Payer: Self-pay

## 2013-11-29 ENCOUNTER — Encounter: Payer: Self-pay | Admitting: Internal Medicine

## 2013-12-08 ENCOUNTER — Emergency Department (HOSPITAL_COMMUNITY)
Admission: EM | Admit: 2013-12-08 | Discharge: 2013-12-08 | Disposition: A | Discharge status: Home / Self Care | Payer: Self-pay | Attending: Emergency Medicine | Admitting: Emergency Medicine

## 2013-12-08 ENCOUNTER — Encounter (HOSPITAL_COMMUNITY): Payer: Self-pay | Admitting: Emergency Medicine

## 2013-12-08 DIAGNOSIS — Z8611 Personal history of tuberculosis: Secondary | ICD-10-CM | POA: Insufficient documentation

## 2013-12-08 DIAGNOSIS — Z87891 Personal history of nicotine dependence: Secondary | ICD-10-CM | POA: Insufficient documentation

## 2013-12-08 DIAGNOSIS — Z792 Long term (current) use of antibiotics: Secondary | ICD-10-CM | POA: Insufficient documentation

## 2013-12-08 DIAGNOSIS — E119 Type 2 diabetes mellitus without complications: Secondary | ICD-10-CM | POA: Insufficient documentation

## 2013-12-08 DIAGNOSIS — F4310 Post-traumatic stress disorder, unspecified: Secondary | ICD-10-CM | POA: Insufficient documentation

## 2013-12-08 DIAGNOSIS — F329 Major depressive disorder, single episode, unspecified: Secondary | ICD-10-CM | POA: Insufficient documentation

## 2013-12-08 DIAGNOSIS — I10 Essential (primary) hypertension: Secondary | ICD-10-CM | POA: Insufficient documentation

## 2013-12-08 DIAGNOSIS — Z862 Personal history of diseases of the blood and blood-forming organs and certain disorders involving the immune mechanism: Secondary | ICD-10-CM | POA: Insufficient documentation

## 2013-12-08 DIAGNOSIS — Z8619 Personal history of other infectious and parasitic diseases: Secondary | ICD-10-CM | POA: Insufficient documentation

## 2013-12-08 DIAGNOSIS — H9203 Otalgia, bilateral: Secondary | ICD-10-CM | POA: Insufficient documentation

## 2013-12-08 DIAGNOSIS — Z79899 Other long term (current) drug therapy: Secondary | ICD-10-CM | POA: Insufficient documentation

## 2013-12-08 DIAGNOSIS — E780 Pure hypercholesterolemia: Secondary | ICD-10-CM | POA: Insufficient documentation

## 2013-12-08 LAB — CBG MONITORING, ED: GLUCOSE-CAPILLARY: 111 mg/dL — AB (ref 70–99)

## 2013-12-08 NOTE — ED Notes (Addendum)
Pt reports feels unwell and meter is broken so is wondering if sugar is high. States she feels like ear throbbing. CBG is 111.

## 2013-12-08 NOTE — ED Notes (Signed)
Pt A&OX4, ambulatory at d/c with steady gait, NAD 

## 2013-12-08 NOTE — Discharge Instructions (Signed)
Take you meter to the pharmacy to be taught the proper use

## 2013-12-08 NOTE — ED Provider Notes (Signed)
CSN: [id number]     Arrival date & time 12/08/13  2046 History   First MD Initiated Contact with Patient 12/08/13 2108     Chief Complaint  Patient presents with  . Otalgia     (Consider location/radiation/quality/duration/timing/severity/associated sxs/prior Treatment) HPI Comments: Patient is basically here to have a blood sugar check as she does not know how to operate her glucometer and she was feeling "funny". On arrival she noted that her ears felt full and she developed some nasal congestion  She does have a history od seasonal allergies.  Denies N/V/D, fever, excessive thirst or polyuria   Patient is a 48 y.o. female presenting with ear pain. The history is provided by the patient.  Otalgia Location:  Bilateral Behind ear:  No abnormality Quality:  Unable to specify Severity:  No pain Onset quality:  Gradual Duration:  2 hours Timing:  Constant Progression:  Unchanged Chronicity:  New Context: not direct blow, not elevation change, not foreign body in ear, not loud noise and no water in ear   Relieved by:  None tried Worsened by:  Nothing tried Ineffective treatments:  None tried Associated symptoms: congestion   Associated symptoms: no diarrhea, no ear discharge, no fever, no headaches, no hearing loss, no neck pain, no rash, no rhinorrhea, no sore throat and no tinnitus   Congestion:    Location:  Nasal   Interferes with sleep: no     Interferes with eating/drinking: no     Past Medical History  Diagnosis Date  . Tuberculosis     was treated  . Gonorrhea   . Chlamydia   . Breast abscess   . Substance abuse     alcohol  . Recurrent upper respiratory infection (URI)     URI/SOB-treated with antibiotics-cleared up now  . Depression     PTSD also  . Anemia     when in late teens  . Diabetes mellitus without complication   . Hypertension   . Hypercholesteremia    Past Surgical History  Procedure Laterality Date  . Tongue surgery  2008    lump removed  .  Incise and drain abcess  01/03/11    breast abscess  . Breast lumpectomy     Family History  Problem Relation Age of Onset  . Breast cancer Mother   . Breast cancer Sister   . Breast cancer Maternal Aunt    History  Substance Use Topics  . Smoking status: Former Smoker -- 0.50 packs/day for 30 years    Types: Cigarettes    Quit date: 12/11/2012  . Smokeless tobacco: Never Used     Comment: None in 5 days  . Alcohol Use: No     Comment: Stopped x 2 weeks ago   OB History    Gravida Para Term Preterm AB TAB SAB Ectopic Multiple Living   2 2 2       2      Review of Systems  Constitutional: Negative for fever.  HENT: Positive for congestion and ear pain. Negative for ear discharge, hearing loss, rhinorrhea, sore throat and tinnitus.   Respiratory: Negative for shortness of breath.   Gastrointestinal: Negative for diarrhea.  Musculoskeletal: Negative for neck pain.  Skin: Negative for rash.  Neurological: Negative for headaches.  All other systems reviewed and are negative.     Allergies  Review of patient's allergies indicates no known allergies.  Home Medications   Prior to Admission medications   Medication Sig Start Date End  Date Taking? Authorizing Provider  acetaminophen (TYLENOL) 500 MG tablet Take 1,000 mg by mouth every 6 (six) hours as needed for mild pain.    Historical Provider, MD  albuterol (PROVENTIL HFA;VENTOLIN HFA) 108 (90 BASE) MCG/ACT inhaler Inhale 2 puffs into the lungs every 6 (six) hours as needed for wheezing or shortness of breath. 09/08/13   Jennifer L Piepenbrink, PA-C  gabapentin (NEURONTIN) 100 MG capsule Take 1 capsule (100 mg total) by mouth 3 (three) times daily. 09/17/13 09/17/14  Gust RungErik C Hoffman, DO  lisinopril (PRINIVIL,ZESTRIL) 10 MG tablet Take 1 tablet (10 mg total) by mouth daily. 08/31/13   Marrian SalvageJacquelyn S Gill, MD  metFORMIN (GLUCOPHAGE) 500 MG tablet Take 1 tablet (500 mg total) by mouth 2 (two) times daily with a meal. 08/31/13 08/31/14   Marrian SalvageJacquelyn S Gill, MD  metroNIDAZOLE (FLAGYL) 500 MG tablet Take 1 tablet (500 mg total) by mouth 2 (two) times daily. 11/12/13   Peggy Constant, MD  naproxen sodium (ANAPROX) 220 MG tablet Take 220 mg by mouth daily as needed (pain).    Historical Provider, MD  pravastatin (PRAVACHOL) 40 MG tablet Take 1 tablet (40 mg total) by mouth daily. 08/16/13   Gust RungErik C Hoffman, DO  sertraline (ZOLOFT) 50 MG tablet Take 50 mg by mouth daily.    Historical Provider, MD  traZODone (DESYREL) 50 MG tablet Take 1 tablet (50 mg total) by mouth at bedtime and may repeat dose one time if needed. For sleep 03/24/13   Sanjuana KavaAgnes I Nwoko, NP   BP 162/92 mmHg  Pulse 77  Temp(Src) 97.7 F (36.5 C) (Oral)  Resp 24  Ht 5\' 3"  (1.6 m)  Wt 157 lb (71.215 kg)  BMI 27.82 kg/m2  SpO2 97%  LMP 01/22/2013 Physical Exam  Constitutional: She is oriented to person, place, and time. She appears well-developed and well-nourished.  HENT:  Head: Normocephalic.  Right Ear: External ear normal.  Left Ear: External ear normal.  Mouth/Throat: Oropharynx is clear and moist.  Eyes: Pupils are equal, round, and reactive to light.  Neck: Normal range of motion.  Cardiovascular: Normal rate and regular rhythm.   Pulmonary/Chest: Effort normal and breath sounds normal.  Lymphadenopathy:    She has no cervical adenopathy.  Neurological: She is alert and oriented to person, place, and time.  Skin: Skin is warm and dry. No rash noted.  Vitals reviewed.   ED Course  Procedures (including critical care time) Labs Review Labs Reviewed  CBG MONITORING, ED - Abnormal; Notable for the following:    Glucose-Capillary 111 (*)    All other components within normal limits    Imaging Review No results found.   EKG Interpretation None      MDM  No signs of infection It is recommended that patient take her meter to the pharmacy or her PCP to be taught it's proper use  Final diagnoses:  Otalgia of both ears         Arman FilterGail K Ardean Melroy,  NP 12/08/13 2119  Purvis SheffieldForrest Harrison, MD 12/09/13 0009

## 2013-12-31 ENCOUNTER — Other Ambulatory Visit: Payer: Self-pay | Admitting: *Deleted

## 2013-12-31 DIAGNOSIS — I10 Essential (primary) hypertension: Secondary | ICD-10-CM

## 2013-12-31 MED ORDER — LISINOPRIL 10 MG PO TABS: 10.0000 mg | ORAL_TABLET | Freq: Every day | ORAL | Status: DC

## 2014-01-06 ENCOUNTER — Encounter: Payer: Self-pay | Admitting: Internal Medicine

## 2014-02-03 ENCOUNTER — Encounter: Payer: Self-pay | Admitting: Internal Medicine

## 2014-03-02 ENCOUNTER — Other Ambulatory Visit: Payer: Self-pay | Admitting: *Deleted

## 2014-03-02 ENCOUNTER — Telehealth: Payer: Self-pay | Admitting: Internal Medicine

## 2014-03-02 DIAGNOSIS — I10 Essential (primary) hypertension: Secondary | ICD-10-CM

## 2014-03-02 NOTE — Telephone Encounter (Signed)
Call to patient to confirm appointment for 03/03/14 at 3:45. lmtcb

## 2014-03-03 ENCOUNTER — Encounter: Payer: Self-pay | Admitting: Internal Medicine

## 2014-03-03 MED ORDER — LISINOPRIL 10 MG PO TABS: 10.0000 mg | ORAL_TABLET | Freq: Every day | ORAL | Status: DC

## 2014-03-10 ENCOUNTER — Encounter: Payer: Self-pay | Admitting: Internal Medicine

## 2014-03-24 ENCOUNTER — Encounter: Payer: Self-pay | Admitting: Internal Medicine

## 2014-04-07 ENCOUNTER — Ambulatory Visit: Payer: Self-pay

## 2014-05-04 ENCOUNTER — Encounter: Payer: Self-pay | Admitting: Internal Medicine

## 2014-06-15 ENCOUNTER — Telehealth: Payer: Self-pay | Admitting: Internal Medicine

## 2014-06-15 NOTE — Telephone Encounter (Signed)
Call to patient to confirm appointment for 06/16/14 at 2:45 lmtcb ° °

## 2014-06-16 ENCOUNTER — Encounter: Payer: Self-pay | Admitting: Internal Medicine

## 2014-06-16 ENCOUNTER — Ambulatory Visit (INDEPENDENT_AMBULATORY_CARE_PROVIDER_SITE_OTHER): Payer: Self-pay | Admitting: Internal Medicine

## 2014-06-16 VITALS — BP 135/80 | HR 68 | Temp 98.3°F | Ht 64.0 in | Wt 168.6 lb

## 2014-06-16 DIAGNOSIS — E1122 Type 2 diabetes mellitus with diabetic chronic kidney disease: Secondary | ICD-10-CM

## 2014-06-16 DIAGNOSIS — IMO0001 Reserved for inherently not codable concepts without codable children: Secondary | ICD-10-CM

## 2014-06-16 DIAGNOSIS — E1165 Type 2 diabetes mellitus with hyperglycemia: Secondary | ICD-10-CM

## 2014-06-16 DIAGNOSIS — E1140 Type 2 diabetes mellitus with diabetic neuropathy, unspecified: Secondary | ICD-10-CM

## 2014-06-16 DIAGNOSIS — E1169 Type 2 diabetes mellitus with other specified complication: Secondary | ICD-10-CM

## 2014-06-16 DIAGNOSIS — F1021 Alcohol dependence, in remission: Secondary | ICD-10-CM

## 2014-06-16 DIAGNOSIS — N181 Chronic kidney disease, stage 1: Secondary | ICD-10-CM

## 2014-06-16 DIAGNOSIS — R809 Proteinuria, unspecified: Secondary | ICD-10-CM

## 2014-06-16 DIAGNOSIS — I10 Essential (primary) hypertension: Secondary | ICD-10-CM

## 2014-06-16 DIAGNOSIS — E785 Hyperlipidemia, unspecified: Secondary | ICD-10-CM

## 2014-06-16 DIAGNOSIS — F1994 Other psychoactive substance use, unspecified with psychoactive substance-induced mood disorder: Secondary | ICD-10-CM

## 2014-06-16 DIAGNOSIS — IMO0002 Reserved for concepts with insufficient information to code with codable children: Secondary | ICD-10-CM

## 2014-06-16 LAB — BASIC METABOLIC PANEL WITH GFR
BUN: 5 mg/dL — ABNORMAL LOW (ref 6–23)
CALCIUM: 9.7 mg/dL (ref 8.4–10.5)
CHLORIDE: 100 meq/L (ref 96–112)
CO2: 25 meq/L (ref 19–32)
Creat: 0.47 mg/dL — ABNORMAL LOW (ref 0.50–1.10)
GFR, Est African American: >89 mL/min
GLUCOSE: 247 mg/dL — AB (ref 70–99)
Potassium: 4.9 mEq/L (ref 3.5–5.3)
Sodium: 137 mEq/L (ref 135–145)

## 2014-06-16 LAB — LIPID PANEL
CHOLESTEROL: 183 mg/dL (ref 0–200)
HDL: 31 mg/dL — AB (ref >=46)
LDL CALC: 115 mg/dL — AB (ref 0–99)
Total CHOL/HDL Ratio: 5.9 Ratio
Triglycerides: 184 mg/dL — ABNORMAL HIGH (ref <150)
VLDL: 37 mg/dL (ref 0–40)

## 2014-06-16 LAB — GLUCOSE, CAPILLARY: Glucose-Capillary: 238 mg/dL — ABNORMAL HIGH (ref 65–99)

## 2014-06-16 LAB — POCT GLYCOSYLATED HEMOGLOBIN (HGB A1C): HEMOGLOBIN A1C: 10.1

## 2014-06-16 MED ORDER — PRAVASTATIN SODIUM 40 MG PO TABS: 40.0000 mg | ORAL_TABLET | Freq: Every day | ORAL | Status: DC

## 2014-06-16 MED ORDER — LISINOPRIL 10 MG PO TABS: 10.0000 mg | ORAL_TABLET | Freq: Every day | ORAL | Status: DC

## 2014-06-16 MED ORDER — METFORMIN HCL 1000 MG PO TABS: 1000.0000 mg | ORAL_TABLET | Freq: Two times a day (BID) | ORAL | Status: DC

## 2014-06-16 NOTE — Progress Notes (Signed)
[facility] Subjective:   Patient ID: [name] female   DOB: 09/28/1965 49 y.o.   MRN: [id number]  HPI: [name] is a 49 y.o. female with a PMH detailed below who presents for overdue follow up of HTN and DM. She reports she has missed multiple follow up appointments because [place] has been away [place] with family getting sober.  She apparently has been sober for about 2-3 months and reports she is doing very well in every way.  She has not been checking her sugars regularly but knows they have been running higher.  She has been taking Metformin 500mg  BID.  She also reports to me that she has quit smoking!     Past Medical History  Diagnosis Date  . Tuberculosis     was treated  . Gonorrhea   . Chlamydia   . Breast abscess   . Substance abuse     alcohol  . Recurrent upper respiratory infection (URI)     URI/SOB-treated with antibiotics-cleared up now  . Depression     PTSD also  . Anemia     when in late teens  . Diabetes mellitus without complication   . Hypertension   . Hypercholesteremia    Current Outpatient Prescriptions  Medication Sig Dispense Refill  . albuterol (PROVENTIL HFA;VENTOLIN HFA) 108 (90 BASE) MCG/ACT inhaler Inhale 2 puffs into the lungs every 6 (six) hours as needed for wheezing or shortness of breath. 1 Inhaler 2  . lisinopril (PRINIVIL,ZESTRIL) 10 MG tablet Take 1 tablet (10 mg total) by mouth daily. 90 tablet 3  . metFORMIN (GLUCOPHAGE) 1000 MG tablet Take 1 tablet (1,000 mg total) by mouth 2 (two) times daily with a meal. 180 tablet 3  . pravastatin (PRAVACHOL) 40 MG tablet Take 1 tablet (40 mg total) by mouth daily. 90 tablet 3   No current facility-administered medications for this visit.   Family History  Problem Relation Age of Onset  . Breast cancer Mother   . Breast cancer Sister   . Breast cancer Maternal Aunt    History   Social History  . Marital Status: Single    Spouse Name: N/A   . Number of Children: N/A  . Years of Education: GED 1987   Occupational History  . unemployed    Social History Main Topics  . Smoking status: Former Smoker -- 0.50 packs/day for 30 years    Types: Cigarettes    Quit date: 12/11/2012  . Smokeless tobacco: Never Used     Comment: None in 5 days  . Alcohol Use: No     Comment: Stopped x 2 weeks ago  . Drug Use: No     Comment: sober for 11 yrs  . Sexual Activity: Not Currently    Birth Control/ Protection: None   Other Topics Concern  . None   Social History Narrative   Review of Systems: Review of Systems  Constitutional: Negative for fever and malaise/fatigue.  Eyes: Negative for blurred vision.  Respiratory: Negative for cough.   Cardiovascular: Negative for chest pain.  Gastrointestinal: Negative for heartburn, abdominal pain, constipation and blood in stool.  Genitourinary: Negative for dysuria and frequency.  Neurological: Negative for headaches.  Endo/Heme/Allergies: Negative for polydipsia.  Psychiatric/Behavioral: Negative for depression, suicidal ideas and substance abuse.     Objective:  Physical Exam: Filed Vitals:   06/16/14 1534  BP: 135/80  Pulse: 68  Temp: 98.3 F (36.8 C)  TempSrc: Oral  Height:  (1.626 m)  Weight: 168 lb 9.6 oz (76.476 kg)  SpO2: 98%  Physical Exam  Constitutional: She is well-developed, well-nourished, and in no distress. No distress.  HENT:  Head: Normocephalic and atraumatic.  Cardiovascular: Normal rate and regular rhythm.   No murmur heard. Pulmonary/Chest: Effort normal and breath sounds normal. She has no wheezes.  Abdominal: Soft. Bowel sounds are normal.  Musculoskeletal: She exhibits no edema.  Psychiatric: Mood and affect normal.  Nursing note and vitals reviewed.   Assessment & Plan:  Case discussed with Dr. Josem Kaufmann  Substance induced mood disorder - Patient reports she is no longer depressed and feeling well now that she is no longer  drinking.   Alcohol dependence - I congratulated her on her sobriety.    Hyperlipidemia - Check Lipid panel and continue pravastatin  daily. - Patient has at least 1 month of pravastatin left with her today (last prescribed 6 months of refills about a year ago) I suspect some non compliance.   Diabetic neuropathy, painful -reports this has greatly improved with cessation of ETOH, no longer needing gabapentin.   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 Lab Results  Component Value Date   HGBA1C 10.1 06/16/2014   HGBA1C 5.4 08/12/2013   HGBA1C 9.0 04/14/2013     Assessment: Diabetes control: poor control (HgbA1C >9%) Progress toward A1C goal:  deteriorated Comments: previous had hypoglycemia  Plan: Medications:  Increase metformin to 1g BID Home glucose monitoring: Frequency: once a day Timing: before breakfast Instruction/counseling given: reminded to bring blood glucose meter & log to each visit and reminded to bring medications to each visit Educational resources provided: brochure Self management tools provided:   Other plans: suspect previous hypoglycemia was largely due to ETOH and variable eating.  She was previously well controlled on glipizide and metformin. She may need glipizide added back next visit.      Hypertension BP Readings from Last 3 Encounters:  06/16/14 135/80  12/08/13 133/74  09/08/13 152/103    Lab Results  Component Value Date   NA 137 06/16/2014   K 4.9 06/16/2014   CREATININE 0.47* 06/16/2014    Assessment: Blood pressure control: controlled Progress toward BP goal:  at goal Comments:   Plan: Medications:  continue current medications Educational resources provided:   Self management tools provided:   Other plans:       Medications Ordered Meds ordered this encounter  Medications  . lisinopril (PRINIVIL,ZESTRIL) 10 MG tablet    Sig: Take 1 tablet (10 mg total) by mouth daily.     Dispense:  90 tablet    Refill:  3  . pravastatin (PRAVACHOL) 40 MG tablet    Sig: Take 1 tablet (40 mg total) by mouth daily.    Dispense:  90 tablet    Refill:  3  . metFORMIN (GLUCOPHAGE) 1000 MG tablet    Sig: Take 1 tablet (1,000 mg total) by mouth 2 (two) times daily with a meal.    Dispense:  180 tablet    Refill:  3   Other Orders Orders Placed This Encounter  Procedures  . Lipid Profile  . Glucose, capillary  . BMP with Estimated GFR (CPT-80048)  . POCT HgB A1C

## 2014-06-16 NOTE — Patient Instructions (Signed)
General Instructions: Please increase Metformin to 1000mg  twice a day.  Please bring your medicines with you each time you come to clinic.  Medicines may include prescription medications, over-the-counter medications, herbal remedies, eye drops, vitamins, or other pills.   Progress Toward Treatment Goals:  Treatment Goal 06/16/2014  Hemoglobin A1C deteriorated  Blood pressure at goal  Stop smoking -  Prevent falls -    Self Care Goals & Plans:  Self Care Goal 06/16/2014  Manage my medications take my medicines as prescribed; bring my medications to every visit; refill my medications on time  Monitor my health keep track of my blood glucose; bring my glucose meter and log to each visit; check my feet daily  Eat healthy foods eat more vegetables; eat foods that are low in salt; eat baked foods instead of fried foods; drink diet soda or water instead of juice or soda  Be physically active take a walk every day  Stop smoking -  Prevent falls -  Meeting treatment goals -    Home Blood Glucose Monitoring 06/16/2014  Check my blood sugar once a day  When to check my blood sugar before breakfast     Care Management & Community Referrals:  Referral 06/16/2014  Referrals made for care management support none needed  Referrals made to community resources -

## 2014-06-17 NOTE — Assessment & Plan Note (Signed)
-   I congratulated her on her sobriety.

## 2014-06-17 NOTE — Assessment & Plan Note (Signed)
-   Check Lipid panel and continue pravastatin 40mg  daily. - Patient has at least 1 month of pravastatin left with her today (last prescribed 6 months of refills about a year ago) I suspect some non compliance.

## 2014-06-17 NOTE — Assessment & Plan Note (Signed)
-  reports this has greatly improved with cessation of ETOH, no longer needing gabapentin.

## 2014-06-17 NOTE — Assessment & Plan Note (Signed)
Lab Results  Component Value Date   HGBA1C 10.1 06/16/2014   HGBA1C 5.4 08/12/2013   HGBA1C 9.0 04/14/2013     Assessment: Diabetes control: poor control (HgbA1C >9%) Progress toward A1C goal:  deteriorated Comments: previous had hypoglycemia  Plan: Medications:  Increase metformin to 1g BID Home glucose monitoring: Frequency: once a day Timing: before breakfast Instruction/counseling given: reminded to bring blood glucose meter & log to each visit and reminded to bring medications to each visit Educational resources provided: brochure Self management tools provided:   Other plans: suspect previous hypoglycemia was largely due to ETOH and variable eating.  She was previously well controlled on glipizide and metformin. She may need glipizide added back next visit.

## 2014-06-17 NOTE — Assessment & Plan Note (Signed)
BP Readings from Last 3 Encounters:  06/16/14 135/80  12/08/13 133/74  09/08/13 152/103    Lab Results  Component Value Date   NA 137 06/16/2014   K 4.9 06/16/2014   CREATININE 0.47* 06/16/2014    Assessment: Blood pressure control: controlled Progress toward BP goal:  at goal Comments:   Plan: Medications:  continue current medications Educational resources provided:   Self management tools provided:   Other plans:

## 2014-06-17 NOTE — Assessment & Plan Note (Signed)
-   Patient reports she is no longer depressed and feeling well now that she is no longer drinking.

## 2014-06-21 NOTE — Progress Notes (Signed)
Case discussed with Dr. Hoffman at time of visit. We reviewed the resident's history and exam and pertinent patient test results. I agree with the assessment, diagnosis, and plan of care documented in the resident's note. 

## 2014-06-26 ENCOUNTER — Encounter (HOSPITAL_COMMUNITY): Payer: Self-pay | Admitting: Vascular Surgery

## 2014-06-26 ENCOUNTER — Emergency Department (HOSPITAL_COMMUNITY)
Admission: EM | Admit: 2014-06-26 | Discharge: 2014-06-26 | Discharge status: Against Medical Advice | Payer: Self-pay | Attending: Emergency Medicine | Admitting: Emergency Medicine

## 2014-06-26 DIAGNOSIS — Y998 Other external cause status: Secondary | ICD-10-CM | POA: Insufficient documentation

## 2014-06-26 DIAGNOSIS — E119 Type 2 diabetes mellitus without complications: Secondary | ICD-10-CM | POA: Insufficient documentation

## 2014-06-26 DIAGNOSIS — I10 Essential (primary) hypertension: Secondary | ICD-10-CM | POA: Insufficient documentation

## 2014-06-26 DIAGNOSIS — G40909 Epilepsy, unspecified, not intractable, without status epilepticus: Secondary | ICD-10-CM | POA: Insufficient documentation

## 2014-06-26 DIAGNOSIS — Z87891 Personal history of nicotine dependence: Secondary | ICD-10-CM | POA: Insufficient documentation

## 2014-06-26 DIAGNOSIS — Y9289 Other specified places as the place of occurrence of the external cause: Secondary | ICD-10-CM | POA: Insufficient documentation

## 2014-06-26 DIAGNOSIS — W1839XA Other fall on same level, initial encounter: Secondary | ICD-10-CM | POA: Insufficient documentation

## 2014-06-26 DIAGNOSIS — S0121XA Laceration without foreign body of nose, initial encounter: Secondary | ICD-10-CM | POA: Insufficient documentation

## 2014-06-26 DIAGNOSIS — Y9389 Activity, other specified: Secondary | ICD-10-CM | POA: Insufficient documentation

## 2014-06-26 HISTORY — DX: Pure hypercholesterolemia, unspecified: E78.00

## 2014-06-26 NOTE — ED Notes (Signed)
Called again x2 with no answer ?

## 2014-06-26 NOTE — ED Notes (Signed)
Called x 2 with no answer. Also called outside will no answer. Will try again in a few minutes.

## 2014-06-26 NOTE — ED Notes (Addendum)
Pt reports to the ED following a seizure. She was getting out of the car and she began staggering and fell onto the ground. Small laceration noted to bridge of nose. Family member reports the episode lasted approx 5 mins. Her seizures are not tonic clonic seizures and she falls and is unresponsive. She normally takes medication everyday for seizures but she missed her medication today. No oral trauma or incontinence noted. Pt has had some ETOH prior to arrival approx 1/2 can of 24 oz beer. Pt post ictal and disoriented x 4. Resp e/u and skin warm and dry.

## 2014-07-18 ENCOUNTER — Encounter (HOSPITAL_COMMUNITY): Payer: Self-pay

## 2014-07-18 ENCOUNTER — Emergency Department (HOSPITAL_COMMUNITY)
Admission: EM | Admit: 2014-07-18 | Discharge: 2014-07-18 | Disposition: A | Discharge status: Home / Self Care | Payer: Self-pay | Attending: Emergency Medicine | Admitting: Emergency Medicine

## 2014-07-18 DIAGNOSIS — Z8659 Personal history of other mental and behavioral disorders: Secondary | ICD-10-CM | POA: Insufficient documentation

## 2014-07-18 DIAGNOSIS — R739 Hyperglycemia, unspecified: Secondary | ICD-10-CM

## 2014-07-18 DIAGNOSIS — Z79899 Other long term (current) drug therapy: Secondary | ICD-10-CM | POA: Insufficient documentation

## 2014-07-18 DIAGNOSIS — Z8709 Personal history of other diseases of the respiratory system: Secondary | ICD-10-CM | POA: Insufficient documentation

## 2014-07-18 DIAGNOSIS — E1165 Type 2 diabetes mellitus with hyperglycemia: Secondary | ICD-10-CM | POA: Insufficient documentation

## 2014-07-18 DIAGNOSIS — I10 Essential (primary) hypertension: Secondary | ICD-10-CM | POA: Insufficient documentation

## 2014-07-18 DIAGNOSIS — Z862 Personal history of diseases of the blood and blood-forming organs and certain disorders involving the immune mechanism: Secondary | ICD-10-CM | POA: Insufficient documentation

## 2014-07-18 DIAGNOSIS — Z87891 Personal history of nicotine dependence: Secondary | ICD-10-CM | POA: Insufficient documentation

## 2014-07-18 DIAGNOSIS — Z8611 Personal history of tuberculosis: Secondary | ICD-10-CM | POA: Insufficient documentation

## 2014-07-18 DIAGNOSIS — Z8742 Personal history of other diseases of the female genital tract: Secondary | ICD-10-CM | POA: Insufficient documentation

## 2014-07-18 DIAGNOSIS — Z8619 Personal history of other infectious and parasitic diseases: Secondary | ICD-10-CM | POA: Insufficient documentation

## 2014-07-18 DIAGNOSIS — E780 Pure hypercholesterolemia: Secondary | ICD-10-CM | POA: Insufficient documentation

## 2014-07-18 HISTORY — DX: Anxiety disorder, unspecified: F41.9

## 2014-07-18 HISTORY — DX: Unspecified convulsions: R56.9

## 2014-07-18 LAB — I-STAT CHEM 8, ED
BUN: 8 mg/dL (ref 6–20)
CALCIUM ION: 1.08 mmol/L — AB (ref 1.12–1.23)
CHLORIDE: 92 mmol/L — AB (ref 101–111)
Creatinine, Ser: 0.5 mg/dL (ref 0.44–1.00)
Glucose, Bld: 213 mg/dL — ABNORMAL HIGH (ref 65–99)
HCT: 45 % (ref 36.0–46.0)
Hemoglobin: 15.3 g/dL — ABNORMAL HIGH (ref 12.0–15.0)
Potassium: 4.3 mmol/L (ref 3.5–5.1)
Sodium: 131 mmol/L — ABNORMAL LOW (ref 135–145)
TCO2: 27 mmol/L (ref 0–100)

## 2014-07-18 LAB — CBG MONITORING, ED: GLUCOSE-CAPILLARY: 247 mg/dL — AB (ref 65–99)

## 2014-07-18 NOTE — ED Notes (Signed)
Per [facility]- Pt c/o "feeling her sugar drop and felt shaky" so she ate a candy bar. CBG 207 now. Pt now wants evaluation.

## 2014-07-18 NOTE — Discharge Instructions (Signed)

## 2014-07-18 NOTE — ED Provider Notes (Signed)
CSN: [id number]     Arrival date & time 07/18/14  1714 History   First MD Initiated Contact with Patient 07/18/14 2022     Chief Complaint  Patient presents with  . Hypoglycemia     (Consider location/radiation/quality/duration/timing/severity/associated sxs/prior Treatment) HPI Comments: Patient here concerned that her blood sugar was decreasing prior to arrival. Felt shaky and diaphoretic and then ate a candy bar and now fills better. Denies any recent illnesses. States that she's been a great deal of stress recently and has not been compliant with a diabetic diet. Denies any chest or abdominal pain. No headache. No polyuria polydipsia. Feels back to her baseline  Patient is a 49 y.o. female presenting with hypoglycemia. The history is provided by the patient.  Hypoglycemia   Past Medical History  Diagnosis Date  . Tuberculosis     was treated  . Gonorrhea   . Chlamydia   . Breast abscess   . Substance abuse     alcohol  . Recurrent upper respiratory infection (URI)     URI/SOB-treated with antibiotics-cleared up now  . Depression     PTSD also  . Anemia     when in late teens  . Diabetes mellitus without complication   . Hypertension   . Hypercholesteremia   . High cholesterol   . Seizures   . Anxiety    Past Surgical History  Procedure Laterality Date  . Tongue surgery  2008    lump removed  . Incise and drain abcess  01/03/11    breast abscess  . Breast lumpectomy     Family History  Problem Relation Age of Onset  . Breast cancer Mother   . Breast cancer Sister   . Breast cancer Maternal Aunt    History  Substance Use Topics  . Smoking status: Former Smoker -- 0.50 packs/day for 30 years    Types: Cigarettes    Quit date: 12/11/2012  . Smokeless tobacco: Never Used     Comment: None in 5 days  . Alcohol Use: 0.0 oz/week    14-16 Cans of beer per week     Comment: last drink today 6/5   OB History    Gravida Para Term Preterm AB TAB SAB Ectopic  Multiple Living   Review of Systems  All other systems reviewed and are negative.     Allergies  Review of patient's allergies indicates no known allergies.  Home Medications   Prior to Admission medications   Medication Sig Start Date End Date Taking? Authorizing Provider  albuterol (PROVENTIL HFA;VENTOLIN HFA) 108 (90 BASE) MCG/ACT inhaler Inhale 2 puffs into the lungs every 6 (six) hours as needed for wheezing or shortness of breath. 09/08/13   Jennifer Piepenbrink, PA-C  lisinopril (PRINIVIL,ZESTRIL) 10 MG tablet Take 1 tablet (10 mg total) by mouth daily. 06/16/14   Gust Rung, DO  metFORMIN (GLUCOPHAGE) 1000 MG tablet Take 1 tablet (1,000 mg total) by mouth 2 (two) times daily with a meal. 06/16/14 06/16/15  Gust Rung, DO  pravastatin (PRAVACHOL) 40 MG tablet Take 1 tablet (40 mg total) by mouth daily. 06/16/14   Gust Rung, DO   BP 149/90 mmHg  Pulse 88  Resp 16  SpO2  Physical Exam  Constitutional: She is oriented to person, place, and time. She appears well-developed and well-nourished.  Non-toxic appearance. No distress.  HENT:  Head: Normocephalic and atraumatic.  Eyes: Conjunctivae, EOM and lids are normal. Pupils are equal, round, and reactive to light.  Neck: Normal range of motion. Neck supple. No tracheal deviation present. No thyroid mass present.  Cardiovascular: Normal rate, regular rhythm and normal heart sounds.  Exam reveals no gallop.   No murmur heard. Pulmonary/Chest: Effort normal and breath sounds normal. No stridor. No respiratory distress. She has no decreased breath sounds. She has no wheezes. She has no rhonchi. She has no rales.  Abdominal: Soft. Normal appearance and bowel sounds are normal. She exhibits no distension. There is no tenderness. There is no rebound and no CVA tenderness.  Musculoskeletal: Normal range of motion. She exhibits no edema or tenderness.  Neurological: She is alert and oriented to person, place,  and time. She has normal strength. No cranial nerve deficit or sensory deficit. GCS eye subscore is 4. GCS verbal subscore is 5. GCS motor subscore is 6.  Skin: Skin is warm and dry. No abrasion and no rash noted.  Psychiatric: She has a normal mood and affect. Her speech is normal and behavior is normal.  Nursing note and vitals reviewed.   ED Course  Procedures (including critical care time) Labs Review Labs Reviewed  I-STAT CHEM 8, ED - Abnormal; Notable for the following:    Sodium 131 (*)    Chloride 92 (*)    Glucose, Bld 213 (*)    Calcium, Ion 1.08 (*)    Hemoglobin 15.3 (*)    All other components within normal limits  CBG MONITORING, ED - Abnormal; Notable for the following:    Glucose-Capillary 247 (*)    All other components within normal limits  CBG MONITORING, ED    Imaging Review No results found.   EKG Interpretation None      MDM   Final diagnoses:  Hyperglycemia    Patient's blood sugar stable here 2. Will follow-up with her Dr.    Lorre NickAnthony Rojelio Uhrich, MD 07/18/14 2042

## 2014-07-18 NOTE — ED Notes (Signed)
Per pt she does not check blood sugars and had hypoglycemic episode. She states she ate a piece of chocolate and sugar was 204 when paramedics checked it upon arrival. She has glucometer at home but does not know how to work it . Diagnosed with DM x 2 years and take metformin .   Pt alert and oriented x 4.

## 2014-12-08 ENCOUNTER — Emergency Department (INDEPENDENT_AMBULATORY_CARE_PROVIDER_SITE_OTHER)
Admission: EM | Admit: 2014-12-08 | Discharge: 2014-12-08 | Disposition: A | Discharge status: Nursing Home (Includes ICF) | Payer: Self-pay | Source: Home / Self Care

## 2014-12-08 ENCOUNTER — Encounter (HOSPITAL_COMMUNITY): Payer: Self-pay | Admitting: *Deleted

## 2014-12-08 ENCOUNTER — Encounter (HOSPITAL_COMMUNITY): Payer: Self-pay

## 2014-12-08 ENCOUNTER — Emergency Department (HOSPITAL_COMMUNITY)
Admission: EM | Admit: 2014-12-08 | Discharge: 2014-12-08 | Disposition: A | Discharge status: Home / Self Care | Payer: Self-pay | Attending: Emergency Medicine | Admitting: Emergency Medicine

## 2014-12-08 DIAGNOSIS — I10 Essential (primary) hypertension: Secondary | ICD-10-CM | POA: Insufficient documentation

## 2014-12-08 DIAGNOSIS — Z8742 Personal history of other diseases of the female genital tract: Secondary | ICD-10-CM | POA: Insufficient documentation

## 2014-12-08 DIAGNOSIS — N390 Urinary tract infection, site not specified: Secondary | ICD-10-CM | POA: Insufficient documentation

## 2014-12-08 DIAGNOSIS — Z87891 Personal history of nicotine dependence: Secondary | ICD-10-CM | POA: Insufficient documentation

## 2014-12-08 DIAGNOSIS — Z8709 Personal history of other diseases of the respiratory system: Secondary | ICD-10-CM | POA: Insufficient documentation

## 2014-12-08 DIAGNOSIS — Z8619 Personal history of other infectious and parasitic diseases: Secondary | ICD-10-CM | POA: Insufficient documentation

## 2014-12-08 DIAGNOSIS — R739 Hyperglycemia, unspecified: Secondary | ICD-10-CM

## 2014-12-08 DIAGNOSIS — Z8659 Personal history of other mental and behavioral disorders: Secondary | ICD-10-CM | POA: Insufficient documentation

## 2014-12-08 DIAGNOSIS — Z8611 Personal history of tuberculosis: Secondary | ICD-10-CM | POA: Insufficient documentation

## 2014-12-08 DIAGNOSIS — E871 Hypo-osmolality and hyponatremia: Secondary | ICD-10-CM

## 2014-12-08 DIAGNOSIS — E1165 Type 2 diabetes mellitus with hyperglycemia: Secondary | ICD-10-CM | POA: Insufficient documentation

## 2014-12-08 DIAGNOSIS — E7800 Pure hypercholesterolemia, unspecified: Secondary | ICD-10-CM | POA: Insufficient documentation

## 2014-12-08 DIAGNOSIS — Z79899 Other long term (current) drug therapy: Secondary | ICD-10-CM | POA: Insufficient documentation

## 2014-12-08 DIAGNOSIS — E0800 Diabetes mellitus due to underlying condition with hyperosmolarity without nonketotic hyperglycemic-hyperosmolar coma (NKHHC): Secondary | ICD-10-CM

## 2014-12-08 LAB — POCT URINALYSIS DIP (DEVICE)
BILIRUBIN URINE: NEGATIVE
Hgb urine dipstick: NEGATIVE
Nitrite: NEGATIVE
PROTEIN: 30 mg/dL — AB
Urobilinogen, UA: 1 mg/dL (ref 0.0–1.0)
pH: 5 (ref 5.0–8.0)

## 2014-12-08 LAB — CBC WITH DIFFERENTIAL/PLATELET
Basophils Absolute: 0 10*3/uL (ref 0.0–0.1)
Basophils Relative: 0 %
EOS ABS: 0.2 10*3/uL (ref 0.0–0.7)
Eosinophils Relative: 2 %
HCT: 38.4 % (ref 36.0–46.0)
HEMOGLOBIN: 13 g/dL (ref 12.0–15.0)
LYMPHS ABS: 1.9 10*3/uL (ref 0.7–4.0)
LYMPHS PCT: 21 %
MCH: 28.4 pg (ref 26.0–34.0)
MCHC: 33.9 g/dL (ref 30.0–36.0)
MCV: 84 fL (ref 78.0–100.0)
Monocytes Absolute: 0.4 10*3/uL (ref 0.1–1.0)
Monocytes Relative: 4 %
NEUTROS ABS: 6.5 10*3/uL (ref 1.7–7.7)
Neutrophils Relative %: 73 %
Platelets: 278 10*3/uL (ref 150–400)
RBC: 4.57 MIL/uL (ref 3.87–5.11)
RDW: 12.6 % (ref 11.5–15.5)
WBC: 8.9 10*3/uL (ref 4.0–10.5)

## 2014-12-08 LAB — URINE MICROSCOPIC-ADD ON

## 2014-12-08 LAB — POCT I-STAT, CHEM 8
BUN: 8 mg/dL (ref 6–20)
CALCIUM ION: 1.16 mmol/L (ref 1.12–1.23)
CHLORIDE: 90 mmol/L — AB (ref 101–111)
Creatinine, Ser: 0.6 mg/dL (ref 0.44–1.00)
Glucose, Bld: 410 mg/dL — ABNORMAL HIGH (ref 65–99)
HEMATOCRIT: 45 % (ref 36.0–46.0)
HEMOGLOBIN: 15.3 g/dL — AB (ref 12.0–15.0)
POTASSIUM: 4.6 mmol/L (ref 3.5–5.1)
Sodium: 127 mmol/L — ABNORMAL LOW (ref 135–145)
TCO2: 25 mmol/L (ref 0–100)

## 2014-12-08 LAB — COMPREHENSIVE METABOLIC PANEL
ALT: 107 U/L — ABNORMAL HIGH (ref 14–54)
ANION GAP: 11 (ref 5–15)
AST: 94 U/L — ABNORMAL HIGH (ref 15–41)
Albumin: 3.9 g/dL (ref 3.5–5.0)
Alkaline Phosphatase: 242 U/L — ABNORMAL HIGH (ref 38–126)
BUN: 8 mg/dL (ref 6–20)
CHLORIDE: 90 mmol/L — AB (ref 101–111)
CO2: 26 mmol/L (ref 22–32)
Calcium: 9.7 mg/dL (ref 8.9–10.3)
Creatinine, Ser: 0.66 mg/dL (ref 0.44–1.00)
Glucose, Bld: 315 mg/dL — ABNORMAL HIGH (ref 65–99)
POTASSIUM: 4.2 mmol/L (ref 3.5–5.1)
Sodium: 127 mmol/L — ABNORMAL LOW (ref 135–145)
Total Bilirubin: 1.1 mg/dL (ref 0.3–1.2)
Total Protein: 8.1 g/dL (ref 6.5–8.1)

## 2014-12-08 LAB — CBG MONITORING, ED
GLUCOSE-CAPILLARY: 372 mg/dL — AB (ref 65–99)
GLUCOSE-CAPILLARY: 227 mg/dL — AB (ref 65–99)

## 2014-12-08 LAB — URINALYSIS, ROUTINE W REFLEX MICROSCOPIC
Bilirubin Urine: NEGATIVE
Glucose, UA: >1000 mg/dL — AB
HGB URINE DIPSTICK: NEGATIVE
Ketones, ur: 15 mg/dL — AB
Nitrite: POSITIVE — AB
Protein, ur: 30 mg/dL — AB
Specific Gravity, Urine: 1.026 (ref 1.005–1.030)
pH: 6 (ref 5.0–8.0)

## 2014-12-08 MED ORDER — CIPROFLOXACIN HCL 500 MG PO TABS: 500.0000 mg | ORAL_TABLET | Freq: Two times a day (BID) | ORAL | Status: DC

## 2014-12-08 MED ORDER — SODIUM CHLORIDE 0.9 % IV BOLUS (SEPSIS)
1000.0000 mL | Freq: Once | INTRAVENOUS | Status: AC
  Administered 2014-12-08: 1000 mL via INTRAVENOUS

## 2014-12-08 NOTE — ED Provider Notes (Signed)
CSN: [id number]     Arrival date & time 12/08/14  1304 History   None    Chief Complaint  Patient presents with  . Urinary Tract Infection   (Consider location/radiation/quality/duration/timing/severity/associated sxs/prior Treatment) Patient is a 49 y.o. female presenting with urinary tract infection. The history is provided by the patient.  Urinary Tract Infection Pain quality:  Burning Pain severity:  Moderate Onset quality:  Sudden Duration:  3 days Progression:  Unchanged Chronicity:  New Recent urinary tract infections: no   Relieved by:  None tried Worsened by:  Nothing tried Ineffective treatments:  None tried Urinary symptoms: frequent urination   Associated symptoms: no abdominal pain, no fever, no flank pain, no nausea, no vaginal discharge and no vomiting   Risk factors: no recurrent urinary tract infections     Past Medical History  Diagnosis Date  . Tuberculosis     was treated  . Gonorrhea   . Chlamydia   . Breast abscess   . Substance abuse     alcohol  . Recurrent upper respiratory infection (URI)     URI/SOB-treated with antibiotics-cleared up now  . Depression     PTSD also  . Anemia     when in late teens  . Diabetes mellitus without complication (HCC)   . Hypertension   . Hypercholesteremia   . High cholesterol   . Seizures (HCC)   . Anxiety    Past Surgical History  Procedure Laterality Date  . Tongue surgery  2008    lump removed  . Incise and drain abcess  01/03/11    breast abscess  . Breast lumpectomy     Family History  Problem Relation Age of Onset  . Breast cancer Mother   . Breast cancer Sister   . Breast cancer Maternal Aunt    Social History  Substance Use Topics  . Smoking status: Former Smoker -- 0.50 packs/day for 30 years    Types: Cigarettes    Quit date: 12/11/2012  . Smokeless tobacco: Never Used     Comment: None in 5 days  . Alcohol Use: 0.0 oz/week    14-16 Cans of beer per week     Comment: last drink  today 6/5   OB History    Gravida Para Term Preterm AB TAB SAB Ectopic Multiple Living   2 2 2       2      Review of Systems  Constitutional: Negative.  Negative for fever and chills.  Cardiovascular: Negative.   Gastrointestinal: Negative.  Negative for nausea, vomiting and abdominal pain.  Genitourinary: Positive for dysuria, urgency and frequency. Negative for flank pain and vaginal discharge.  All other systems reviewed and are negative.   Allergies  Review of patient's allergies indicates no known allergies.  Home Medications   Prior to Admission medications   Medication Sig Start Date End Date Taking? Authorizing Provider  albuterol (PROVENTIL HFA;VENTOLIN HFA) 108 (90 BASE) MCG/ACT inhaler Inhale 2 puffs into the lungs every 6 (six) hours as needed for wheezing or shortness of breath. 09/08/13   Jennifer Piepenbrink, PA-C  lisinopril (PRINIVIL,ZESTRIL) 10 MG tablet Take 1 tablet (10 mg total) by mouth daily. 06/16/14   Gust RungErik C Hoffman, DO  metFORMIN (GLUCOPHAGE) 1000 MG tablet Take 1 tablet (1,000 mg total) by mouth 2 (two) times daily with a meal. 06/16/14 06/16/15  Gust RungErik C Hoffman, DO  pravastatin (PRAVACHOL) 40 MG tablet Take 1 tablet (40 mg total) by mouth daily. 06/16/14   Rolm GalaErik  C Hoffman, DO   Meds Ordered and Administered this Visit  Medications - No data to display  BP 134/94 mmHg  Pulse 107  Temp(Src) 99.1 F (37.3 C) (Oral)  Resp 16  SpO2 99% No data found.   Physical Exam  Constitutional: She is oriented to person, place, and time. She appears well-developed and well-nourished. No distress.  HENT:  Head: Normocephalic.  Right Ear: External ear normal.  Left Ear: External ear normal.  Mouth/Throat: Oropharynx is clear and moist.  Neck: Normal range of motion. Neck supple.  Cardiovascular: Normal heart sounds and intact distal pulses.   Pulmonary/Chest: Effort normal and breath sounds normal.  Abdominal: Soft. Bowel sounds are normal. She exhibits no  distension and no mass. There is no tenderness.  Musculoskeletal: Normal range of motion.  Lymphadenopathy:    She has no cervical adenopathy.  Neurological: She is alert and oriented to person, place, and time.  Skin: Skin is warm and dry.  Nursing note and vitals reviewed.   ED Course  Procedures (including critical care time)  Labs Review Labs Reviewed  POCT URINALYSIS DIP (DEVICE) - Abnormal; Notable for the following:    Glucose, UA >=1000 (*)    Ketones, ur TRACE (*)    Protein, ur 30 (*)    Leukocytes, UA TRACE (*)    All other components within normal limits  POCT I-STAT, CHEM 8 - Abnormal; Notable for the following:    Sodium 127 (*)    Chloride 90 (*)    Glucose, Bld 410 (*)    Hemoglobin 15.3 (*)    All other components within normal limits   i-sat Na 127, glu 410 Imaging Review No results found.   Visual Acuity Review  Right Eye Distance:   Left Eye Distance:   Bilateral Distance:    Right Eye Near:   Left Eye Near:    Bilateral Near:         MDM   1. Hyponatremia syndrome   2. Diabetes mellitus due to underlying condition with hyperosmolarity without coma, without long-term current use of insulin (HCC)    Sent for eval and mngmnt of glu , hyponatremia.    Linna Hoff, MD 12/08/14 [id number]

## 2014-12-08 NOTE — ED Notes (Signed)
PT  REPORTS  LOW  ABD  PRESSURE   WITH  PAIN  IN  HER BACK       SYMPTOMS  X  2-3  DAYS             PT     DENYS  ANY  INJURY

## 2014-12-08 NOTE — ED Provider Notes (Signed)
CSN: [id number]     Arrival date & time 12/08/14  1513 History   First MD Initiated Contact with Patient 12/08/14 1738     Chief Complaint  Patient presents with  . Dysuria  . Hyperglycemia     (Consider location/radiation/quality/duration/timing/severity/associated sxs/prior Treatment) HPI [name] is a 49 y.o. female with a history of diabetes, hypertension, comes in via referral from urgent care for evaluation of hyperglycemia and hyponatremia. On arrival, patient CBG is 372. Patient reports she originally went to urgent care for evaluation of UTI. She reports abdominal discomfort with associated dysuria, urinary frequency. No fevers, chills or nausea or vomiting, numbness or weakness, headache, vision changes. Patient for she has taken all of her medications as prescribed today. Reports she has a follow-up on it with her PCP, Dr. Mikey BussingHoffman on December 8. She denies any other medical problems at this time. No other modifying factors.   Past Medical History  Diagnosis Date  . Tuberculosis     was treated  . Gonorrhea   . Chlamydia   . Breast abscess   . Substance abuse     alcohol  . Recurrent upper respiratory infection (URI)     URI/SOB-treated with antibiotics-cleared up now  . Depression     PTSD also  . Anemia     when in late teens  . Diabetes mellitus without complication (HCC)   . Hypertension   . Hypercholesteremia   . High cholesterol   . Seizures (HCC)   . Anxiety    Past Surgical History  Procedure Laterality Date  . Tongue surgery  2008    lump removed  . Incise and drain abcess  01/03/11    breast abscess  . Breast lumpectomy     Family History  Problem Relation Age of Onset  . Breast cancer Mother   . Breast cancer Sister   . Breast cancer Maternal Aunt    Social History  Substance Use Topics  . Smoking status: Former Smoker -- 0.50 packs/day for 30 years    Types: Cigarettes    Quit date: 12/11/2012  . Smokeless tobacco: Never Used      Comment: None in 5 days  . Alcohol Use: 0.0 oz/week    14-16 Cans of beer per week     Comment: last drink today 6/5   OB History    Gravida Para Term Preterm AB TAB SAB Ectopic Multiple Living   2 2 2       2      Review of Systems A 10 point review of systems was completed and was negative except for pertinent positives and negatives as mentioned in the history of present illness     Allergies  Review of patient's allergies indicates no known allergies.  Home Medications   Prior to Admission medications   Medication Sig Start Date End Date Taking? Authorizing Provider  lisinopril (PRINIVIL,ZESTRIL) 10 MG tablet Take 1 tablet (10 mg total) by mouth daily. 06/16/14  Yes Gust RungErik C Hoffman, DO  metFORMIN (GLUCOPHAGE) 1000 MG tablet Take 1 tablet (1,000 mg total) by mouth 2 (two) times daily with a meal. 06/16/14 06/16/15 Yes Gust RungErik C Hoffman, DO  pravastatin (PRAVACHOL) 40 MG tablet Take 1 tablet (40 mg total) by mouth daily. 06/16/14  Yes Gust RungErik C Hoffman, DO  albuterol (PROVENTIL HFA;VENTOLIN HFA) 108 (90 BASE) MCG/ACT inhaler Inhale 2 puffs into the lungs every 6 (six) hours as needed for wheezing or shortness of breath. Patient not taking: Reported on  12/08/2014 09/08/13   Jennifer Piepenbrink, PA-C  ciprofloxacin (CIPRO) 500 MG tablet Take 1 tablet (500 mg total) by mouth 2 (two) times daily. 12/08/14   Joycie Peek, PA-C   BP 127/97 mmHg  Pulse 81  Temp(Src) 98.2 F (36.8 C) (Oral)  Resp 18  SpO2 99% Physical Exam  Constitutional: She is oriented to person, place, and time. She appears well-developed and well-nourished. No distress.  HENT:  Head: Normocephalic and atraumatic.  Mouth/Throat: Oropharynx is clear and moist.  Eyes: Conjunctivae are normal. Pupils are equal, round, and reactive to light. Right eye exhibits no discharge. Left eye exhibits no discharge. No scleral icterus.  Neck: Normal range of motion. Neck supple.  Cardiovascular: Normal rate, regular rhythm and  normal heart sounds.   Pulmonary/Chest: Effort normal and breath sounds normal. No respiratory distress. She has no wheezes. She has no rales.  Abdominal: Soft. She exhibits no distension and no mass. There is no tenderness. There is no rebound and no guarding.  Musculoskeletal: She exhibits no tenderness.  Neurological: She is alert and oriented to person, place, and time.  Cranial Nerves II-XII grossly intact. Moves all extremities without ataxia. Gait is baseline. Strength and sensation are intact and are baseline  Skin: Skin is warm and dry. No rash noted. She is not diaphoretic.  Psychiatric: She has a normal mood and affect.  Nursing note and vitals reviewed.   ED Course  Procedures (including critical care time) Labs Review Labs Reviewed  COMPREHENSIVE METABOLIC PANEL - Abnormal; Notable for the following:    Sodium 127 (*)    Chloride 90 (*)    Glucose, Bld 315 (*)    AST 94 (*)    ALT 107 (*)    Alkaline Phosphatase 242 (*)    All other components within normal limits  URINALYSIS, ROUTINE W REFLEX MICROSCOPIC (NOT AT [facility]) - Abnormal; Notable for the following:    APPearance CLOUDY (*)    Glucose, UA >1000 (*)    Ketones, ur 15 (*)    Protein, ur 30 (*)    Nitrite POSITIVE (*)    Leukocytes, UA MODERATE (*)    All other components within normal limits  URINE MICROSCOPIC-ADD ON - Abnormal; Notable for the following:    Squamous Epithelial / LPF 6-30 (*)    Bacteria, UA MANY (*)    All other components within normal limits  CBG MONITORING, ED - Abnormal; Notable for the following:    Glucose-Capillary 372 (*)    All other components within normal limits  CBG MONITORING, ED - Abnormal; Notable for the following:    Glucose-Capillary 227 (*)    All other components within normal limits  CBC WITH DIFFERENTIAL/PLATELET    Imaging Review No results found. I have personally reviewed and evaluated these images and lab results as part of my medical decision-making.    EKG Interpretation None     Meds given in ED:  Medications  sodium chloride 0.9 % bolus 1,000 mL (0 mLs Intravenous Stopped 12/08/14 1948)    New Prescriptions   CIPROFLOXACIN (CIPRO) 500 MG TABLET    Take 1 tablet (500 mg total) by mouth 2 (two) times daily.   Filed Vitals:   12/08/14 1731 12/08/14 1745 12/08/14 1900 12/08/14 1915  BP: 127/97 119/84 138/95 127/97  Pulse: 82 77 86 81  Temp:      TempSrc:      Resp: 18     SpO2: 98% 97% 97% 99%    MDM  Vitals stable - WNL -afebrile Pt resting comfortably in ED. PE--patient is overall well-appearing, benign abdominal exam. Ambulates throughout the ED without difficulty. At baseline per her husband in the room. Labwork-hyponatremia 127, remains asymptomatic, elevated liver enzyme without any right upper quadrant abdominal tenderness. There is evidence of UTI on urinalysis-nitrite positive moderate leukocytes with too numerous to count white blood cells and many bacteria.  DDX--patient responds well to fluid bolus in the ED and CBG is now 227. No evidence of DKA. Patient has asymptomatic hyponatremia. Patient reports she is trying not to eat any salt due to her daughter being a vegetarian and encouraging her not to use salt or pepper. Encouraged patient to make him well rounded diet and drink plenty of fluids. Will obtain urine culture and treat UTI with Cipro. Encouraged her to follow-up with her PCP for regularly scheduled appointment in December, but also to call tomorrow to see if appointment can be moved up.  I discussed all relevant lab findings and imaging results with pt and they verbalized understanding. Discussed f/u with PCP within 48 hrs and return precautions, pt very amenable to plan. Overall, patient appears well, nontoxic, hemodynamically stable with normal vital signs and is appropriate for discharge. Final diagnoses:  UTI (lower urinary tract infection)  Hyperglycemia       Joycie Peek, PA-C 12/08/14  4782  Rolland Porter, MD 12/15/14 [id number]

## 2014-12-08 NOTE — Discharge Instructions (Signed)
Take your antibiotics as prescribed for your UTI. Follow-up with your doctor within the next 2-3 days for reevaluation and further medication management for your diabetes. Return to ED for any new or worsening symptoms.  Blood Glucose Monitoring, Adult Monitoring your blood glucose (also know as blood sugar) helps you to manage your diabetes. It also helps you and your health care provider monitor your diabetes and determine how well your treatment plan is working. WHY SHOULD YOU MONITOR YOUR BLOOD GLUCOSE?  It can help you understand how food, exercise, and medicine affect your blood glucose.  It allows you to know what your blood glucose is at any given moment. You can quickly tell if you are having low blood glucose (hypoglycemia) or high blood glucose (hyperglycemia).  It can help you and your health care provider know how to adjust your medicines.  It can help you understand how to manage an illness or adjust medicine for exercise. WHEN SHOULD YOU TEST? Your health care provider will help you decide how often you should check your blood glucose. This may depend on the type of diabetes you have, your diabetes control, or the types of medicines you are taking. Be sure to write down all of your blood glucose readings so that this information can be reviewed with your health care provider. See below for examples of testing times that your health care provider may suggest. Type 1 Diabetes  Test at least 2 times per day if your diabetes is well controlled, if you are using an insulin pump, or if you perform multiple daily injections.  If your diabetes is not well controlled or if you are sick, you may need to test more often.  It is a good idea to also test:  Before every insulin injection.  Before and after exercise.  Between meals and 2 hours after a meal.  Occasionally between 2:00 a.m. and 3:00 a.m. Type 2 Diabetes  If you are taking insulin, test at least 2 times per day. However,  it is best to test before every insulin injection.  If you take medicines by mouth (orally), test 2 times a day.  If you are on a controlled diet, test once a day.  If your diabetes is not well controlled or if you are sick, you may need to monitor more often. HOW TO MONITOR YOUR BLOOD GLUCOSE Supplies Needed  Blood glucose meter.  Test strips for your meter. Each meter has its own strips. You must use the strips that go with your own meter.  A pricking needle (lancet).  A device that holds the lancet (lancing device).  A journal or log book to write down your results. Procedure  Wash your hands with soap and water. Alcohol is not preferred.  Prick the side of your finger (not the tip) with the lancet.  Gently milk the finger until a small drop of blood appears.  Follow the instructions that come with your meter for inserting the test strip, applying blood to the strip, and using your blood glucose meter. Other Areas to Get Blood for Testing Some meters allow you to use other areas of your body (other than your finger) to test your blood. These areas are called alternative sites. The most common alternative sites are:  The forearm.  The thigh.  The back area of the lower leg.  The palm of the hand. The blood flow in these areas is slower. Therefore, the blood glucose values you get may be delayed, and the  numbers are different from what you would get from your fingers. Do not use alternative sites if you think you are having hypoglycemia. Your reading will not be accurate. Always use a finger if you are having hypoglycemia. Also, if you cannot feel your lows (hypoglycemia unawareness), always use your fingers for your blood glucose checks. ADDITIONAL TIPS FOR GLUCOSE MONITORING  Do not reuse lancets.  Always carry your supplies with you.  All blood glucose meters have a 24-hour "hotline" number to call if you have questions or need help.  Adjust (calibrate) your blood  glucose meter with a control solution after finishing a few boxes of strips. BLOOD GLUCOSE RECORD KEEPING It is a good idea to keep a daily record or log of your blood glucose readings. Most glucose meters, if not all, keep your glucose records stored in the meter. Some meters come with the ability to download your records to your home computer. Keeping a record of your blood glucose readings is especially helpful if you are wanting to look for patterns. Make notes to go along with the blood glucose readings because you might forget what happened at that exact time. Keeping good records helps you and your health care provider to work together to achieve good diabetes management.    This information is not intended to replace advice given to you by your health care provider. Make sure you discuss any questions you have with your health care provider.   Document Released: 01/10/2003 Document Revised: 01/28/2014 Document Reviewed: 06/01/2012 Elsevier Interactive Patient Education 2016 Else[employer]  Urinary Tract Infection A urinary tract infection (UTI) can occur any place along the urinary tract. The tract includes the kidneys, ureters, bladder, and urethra. A type of germ called bacteria often causes a UTI. UTIs are often helped with antibiotic medicine.  HOME CARE   If given, take antibiotics as told by your doctor. Finish them even if you start to feel better.  Drink enough fluids to keep your pee (urine) clear or pale yellow.  Avoid tea, drinks with caffeine, and bubbly (carbonated) drinks.  Pee often. Avoid holding your pee in for a long time.  Pee before and after having sex (intercourse).  Wipe from front to back after you poop (bowel movement) if you are a woman. Use each tissue only once. GET HELP RIGHT AWAY IF:   You have back pain.  You have lower belly (abdominal) pain.  You have chills.  You feel sick to your stomach (nauseous).  You throw up (vomit).  Your burning or  discomfort with peeing does not go away.  You have a fever.  Your symptoms are not better in 3 days. MAKE SURE YOU:   Understand these instructions.  Will watch your condition.  Will get help right away if you are not doing well or get worse.   This information is not intended to replace advice given to you by your health care provider. Make sure you discuss any questions you have with your health care provider.   Document Released: 06/26/2007 Document Revised: 01/28/2014 Document Reviewed: 08/08/2011 Elsevier Interactive Patient Education [employer]

## 2014-12-08 NOTE — ED Notes (Signed)
[facility] and thought she may have a UTI. [facility] blood sugar of 410 and UA revealed trace leukocytes. Pt denies N/V/D.

## 2014-12-08 NOTE — ED Notes (Signed)
  CBG 372  

## 2014-12-11 LAB — URINE CULTURE

## 2014-12-12 ENCOUNTER — Telehealth (HOSPITAL_BASED_OUTPATIENT_CLINIC_OR_DEPARTMENT_OTHER): Payer: Self-pay | Admitting: Emergency Medicine

## 2014-12-12 NOTE — Telephone Encounter (Signed)
Post ED Visit - Positive Culture Follow-up  Culture report reviewed by antimicrobial stewardship pharmacist:  []  [name], Pharm.D. []  [name], Pharm.D., BCPS []  [name], Pharm.D. []  [name], Pharm.D., BCPS []  [name], [place]., BCPS, AAHIVP []  [name], Pharm.D., BCPS, AAHIVP []  [name], [address]Ph[address] [x]  [name], [place]arm[place]D.  Positive urine culture E. coli Treated with ciprofloxacin, organism sensitive to the same and no further patient follow-up is required at this time.  [name], [name] 12/12/2014, 11:55 AM

## 2014-12-21 ENCOUNTER — Encounter: Payer: Self-pay | Admitting: Internal Medicine

## 2014-12-29 ENCOUNTER — Encounter: Payer: Self-pay | Admitting: Internal Medicine

## 2014-12-30 ENCOUNTER — Encounter (HOSPITAL_COMMUNITY): Payer: Self-pay | Admitting: *Deleted

## 2014-12-30 ENCOUNTER — Emergency Department (HOSPITAL_COMMUNITY): Payer: Self-pay

## 2014-12-30 ENCOUNTER — Emergency Department (HOSPITAL_COMMUNITY)
Admission: EM | Admit: 2014-12-30 | Discharge: 2014-12-30 | Disposition: A | Discharge status: Home / Self Care | Payer: Self-pay | Attending: Emergency Medicine | Admitting: Emergency Medicine

## 2014-12-30 DIAGNOSIS — H9209 Otalgia, unspecified ear: Secondary | ICD-10-CM | POA: Insufficient documentation

## 2014-12-30 DIAGNOSIS — Z8611 Personal history of tuberculosis: Secondary | ICD-10-CM | POA: Insufficient documentation

## 2014-12-30 DIAGNOSIS — Z8619 Personal history of other infectious and parasitic diseases: Secondary | ICD-10-CM | POA: Insufficient documentation

## 2014-12-30 DIAGNOSIS — Z862 Personal history of diseases of the blood and blood-forming organs and certain disorders involving the immune mechanism: Secondary | ICD-10-CM | POA: Insufficient documentation

## 2014-12-30 DIAGNOSIS — Z79899 Other long term (current) drug therapy: Secondary | ICD-10-CM | POA: Insufficient documentation

## 2014-12-30 DIAGNOSIS — E7800 Pure hypercholesterolemia, unspecified: Secondary | ICD-10-CM | POA: Insufficient documentation

## 2014-12-30 DIAGNOSIS — E119 Type 2 diabetes mellitus without complications: Secondary | ICD-10-CM | POA: Insufficient documentation

## 2014-12-30 DIAGNOSIS — H538 Other visual disturbances: Secondary | ICD-10-CM | POA: Insufficient documentation

## 2014-12-30 DIAGNOSIS — Z7984 Long term (current) use of oral hypoglycemic drugs: Secondary | ICD-10-CM | POA: Insufficient documentation

## 2014-12-30 DIAGNOSIS — Z87891 Personal history of nicotine dependence: Secondary | ICD-10-CM | POA: Insufficient documentation

## 2014-12-30 DIAGNOSIS — R110 Nausea: Secondary | ICD-10-CM | POA: Insufficient documentation

## 2014-12-30 DIAGNOSIS — Z8742 Personal history of other diseases of the female genital tract: Secondary | ICD-10-CM | POA: Insufficient documentation

## 2014-12-30 DIAGNOSIS — R2689 Other abnormalities of gait and mobility: Secondary | ICD-10-CM | POA: Insufficient documentation

## 2014-12-30 DIAGNOSIS — Z8709 Personal history of other diseases of the respiratory system: Secondary | ICD-10-CM | POA: Insufficient documentation

## 2014-12-30 DIAGNOSIS — I10 Essential (primary) hypertension: Secondary | ICD-10-CM | POA: Insufficient documentation

## 2014-12-30 DIAGNOSIS — R42 Dizziness and giddiness: Secondary | ICD-10-CM | POA: Insufficient documentation

## 2014-12-30 LAB — CBC WITH DIFFERENTIAL/PLATELET
Basophils Absolute: 0 10*3/uL (ref 0.0–0.1)
Basophils Relative: 0 %
Eosinophils Absolute: 0.2 10*3/uL (ref 0.0–0.7)
Eosinophils Relative: 4 %
HCT: 38.9 % (ref 36.0–46.0)
Hemoglobin: 13.4 g/dL (ref 12.0–15.0)
Lymphocytes Relative: 29 %
Lymphs Abs: 1.4 10*3/uL (ref 0.7–4.0)
MCH: 28.7 pg (ref 26.0–34.0)
MCHC: 34.4 g/dL (ref 30.0–36.0)
MCV: 83.3 fL (ref 78.0–100.0)
Monocytes Absolute: 0.2 10*3/uL (ref 0.1–1.0)
Monocytes Relative: 5 %
Neutro Abs: 3 10*3/uL (ref 1.7–7.7)
Neutrophils Relative %: 62 %
Platelets: 218 10*3/uL (ref 150–400)
RBC: 4.67 MIL/uL (ref 3.87–5.11)
RDW: 13.1 % (ref 11.5–15.5)
WBC: 4.9 10*3/uL (ref 4.0–10.5)

## 2014-12-30 LAB — RAPID URINE DRUG SCREEN, HOSP PERFORMED
Amphetamines: NOT DETECTED
Barbiturates: NOT DETECTED
Benzodiazepines: NOT DETECTED
COCAINE: NOT DETECTED
OPIATES: NOT DETECTED
Tetrahydrocannabinol: NOT DETECTED

## 2014-12-30 LAB — URINALYSIS, ROUTINE W REFLEX MICROSCOPIC
Bilirubin Urine: NEGATIVE
Glucose, UA: >1000 mg/dL — AB
KETONES UR: 15 mg/dL — AB
LEUKOCYTES UA: NEGATIVE
NITRITE: NEGATIVE
PH: 5 (ref 5.0–8.0)
PROTEIN: 100 mg/dL — AB
Specific Gravity, Urine: 1.026 (ref 1.005–1.030)

## 2014-12-30 LAB — COMPREHENSIVE METABOLIC PANEL
ALT: 89 U/L — ABNORMAL HIGH (ref 14–54)
AST: 82 U/L — ABNORMAL HIGH (ref 15–41)
Albumin: 4.1 g/dL (ref 3.5–5.0)
Alkaline Phosphatase: 196 U/L — ABNORMAL HIGH (ref 38–126)
Anion gap: 14 (ref 5–15)
CHLORIDE: 97 mmol/L — AB (ref 101–111)
CO2: 23 mmol/L (ref 22–32)
Calcium: 9.4 mg/dL (ref 8.9–10.3)
Creatinine, Ser: 0.51 mg/dL (ref 0.44–1.00)
GFR calc Af Amer: >60 mL/min (ref >60)
Glucose, Bld: 310 mg/dL — ABNORMAL HIGH (ref 65–99)
POTASSIUM: 4.1 mmol/L (ref 3.5–5.1)
SODIUM: 134 mmol/L — AB (ref 135–145)
Total Bilirubin: 0.9 mg/dL (ref 0.3–1.2)
Total Protein: 8.3 g/dL — ABNORMAL HIGH (ref 6.5–8.1)

## 2014-12-30 LAB — URINE MICROSCOPIC-ADD ON

## 2014-12-30 LAB — I-STAT TROPONIN, ED: TROPONIN I, POC: 0.01 ng/mL (ref 0.00–0.08)

## 2014-12-30 LAB — CBG MONITORING, ED: GLUCOSE-CAPILLARY: 264 mg/dL — AB (ref 65–99)

## 2014-12-30 LAB — ETHANOL: Alcohol, Ethyl (B): 43 mg/dL — ABNORMAL HIGH (ref <5)

## 2014-12-30 MED ORDER — SODIUM CHLORIDE 0.9 % IV BOLUS (SEPSIS)
1000.0000 mL | Freq: Once | INTRAVENOUS | Status: AC
  Administered 2014-12-30: 1000 mL via INTRAVENOUS

## 2014-12-30 MED ORDER — MECLIZINE HCL 50 MG PO TABS
25.0000 mg | ORAL_TABLET | Freq: Three times a day (TID) | ORAL | Status: DC | PRN
Start: 2014-12-30 — End: 2015-03-10

## 2014-12-30 MED ORDER — LORAZEPAM 2 MG/ML IJ SOLN
0.5000 mg | Freq: Once | INTRAMUSCULAR | Status: AC
  Administered 2014-12-30: 0.5 mg via INTRAVENOUS
  Filled 2014-12-30: qty 1

## 2014-12-30 NOTE — ED Notes (Signed)
Dr. Clarene DukeLittle at the bedside with pt.

## 2014-12-30 NOTE — Discharge Instructions (Signed)
Dizziness Dizziness is a common problem. It is a feeling of unsteadiness or light-headedness. You may feel like you are about to faint. Dizziness can lead to injury if you stumble or fall. Anyone can become dizzy, but dizziness is more common in older adults. This condition can be caused by a number of things, including medicines, dehydration, or illness. HOME CARE INSTRUCTIONS Taking these steps may help with your condition: Eating and Drinking  Drink enough fluid to keep your urine clear or pale yellow. This helps to keep you from becoming dehydrated. Try to drink more clear fluids, such as water.  Do not drink alcohol.  Limit your caffeine intake if directed by your health care provider.  Limit your salt intake if directed by your health care provider. Activity  Avoid making quick movements.  Rise slowly from chairs and steady yourself until you feel okay.  In the morning, first sit up on the side of the bed. When you feel okay, stand slowly while you hold onto something until you know that your balance is fine.  Move your legs often if you need to stand in one place for a long time. Tighten and relax your muscles in your legs while you are standing.  Do not drive or operate heavy machinery if you feel dizzy.  Avoid bending down if you feel dizzy. Place items in your home so that they are easy for you to reach without leaning over. Lifestyle  Do not use any tobacco products, including cigarettes, chewing tobacco, or electronic cigarettes. If you need help quitting, ask your health care provider.  Try to reduce your stress level, such as with yoga or meditation. Talk with your health care provider if you need help. General Instructions  Watch your dizziness for any changes.  Take medicines only as directed by your health care provider. Talk with your health care provider if you think that your dizziness is caused by a medicine that you are taking.  Tell a friend or a family  member that you are feeling dizzy. If he or she notices any changes in your behavior, have this person call your health care provider.  Keep all follow-up visits as directed by your health care provider. This is important. SEEK MEDICAL CARE IF:  Your dizziness does not go away.  Your dizziness or light-headedness gets worse.  You feel nauseous.  You have reduced hearing.  You have new symptoms.  You are unsteady on your feet or you feel like the room is spinning. SEEK IMMEDIATE MEDICAL CARE IF:  You vomit or have diarrhea and are unable to eat or drink anything.  You have problems talking, walking, swallowing, or using your arms, hands, or legs.  You feel generally weak.  You are not thinking clearly or you have trouble forming sentences. It may take a friend or family member to notice this.  You have chest pain, abdominal pain, shortness of breath, or sweating.  Your vision changes.  You notice any bleeding.  You have a headache.  You have neck pain or a stiff neck.  You have a fever.   This information is not intended to replace advice given to you by your health care provider. Make sure you discuss any questions you have with your health care provider.   Document Released: 07/03/2000 Document Revised: 05/24/2014 Document Reviewed: 01/03/2014 Elsevier Interactive Patient Education [employer]   It is very important for you to follow-up in the neurology clinic. A will contact you for  an appointment time.

## 2014-12-30 NOTE — ED Notes (Signed)
Per EMS- pt has been stressed recently with hx of anxiety. Pt reports "shaking". Pt has not been taking her BP medications as well. Pt reports drinking alcohol regularly.

## 2014-12-30 NOTE — ED Provider Notes (Signed)
CSN: [id number]     Arrival date & time 12/30/14  1301 History   First MD Initiated Contact with Patient 12/30/14 1303     Chief Complaint  Patient presents with  . Anxiety  . Hypertension     (Consider location/radiation/quality/duration/timing/severity/associated sxs/prior Treatment) HPI Patient presents with acute onset spinning sensation starting at roughly 11 AM today. States she was in her normal health prior. Associated with nausea and being off balance. Patient states she became very anxious and started shaking. The spinning sensation was worsened with head movement. She had no focal weakness or numbness. She states she's had left ear ringing for one day. Denies any URI symptoms. Denies any neck pain. No recent fever or chills. No chest pain or shortness of breath. No abdominal pain or urinary symptoms. Patient has a history of hypertension and diabetes that is poorly controlled. Currently dizziness has improved. Past Medical History  Diagnosis Date  . Tuberculosis     was treated  . Gonorrhea   . Chlamydia   . Breast abscess   . Substance abuse     alcohol  . Recurrent upper respiratory infection (URI)     URI/SOB-treated with antibiotics-cleared up now  . Depression     PTSD also  . Anemia     when in late teens  . Diabetes mellitus without complication (HCC)   . Hypertension   . Hypercholesteremia   . High cholesterol   . Seizures (HCC)   . Anxiety    Past Surgical History  Procedure Laterality Date  . Tongue surgery  2008    lump removed  . Incise and drain abcess  01/03/11    breast abscess  . Breast lumpectomy     Family History  Problem Relation Age of Onset  . Breast cancer Mother   . Breast cancer Sister   . Breast cancer Maternal Aunt    Social History  Substance Use Topics  . Smoking status: Former Smoker -- 0.50 packs/day for 30 years    Types: Cigarettes    Quit date: 12/11/2012  . Smokeless tobacco: Never Used     Comment: None in 5 days   . Alcohol Use: 0.0 oz/week    14-16 Cans of beer per week     Comment: last drink today 6/5   OB History    Gravida Para Term Preterm AB TAB SAB Ectopic Multiple Living   2 2 2       2      Review of Systems  Constitutional: Negative for fever and chills.  HENT: Positive for ear pain. Negative for congestion, facial swelling, rhinorrhea and sore throat.   Eyes: Positive for visual disturbance.  Respiratory: Negative for shortness of breath.   Cardiovascular: Negative for chest pain, palpitations and leg swelling.  Gastrointestinal: Positive for nausea. Negative for vomiting, abdominal pain and diarrhea.  Genitourinary: Negative for dysuria, frequency, flank pain and difficulty urinating.  Musculoskeletal: Positive for gait problem. Negative for back pain, neck pain and neck stiffness.  Skin: Negative for rash and wound.  Neurological: Positive for dizziness. Negative for syncope, weakness, numbness and headaches.  Psychiatric/Behavioral: The patient is nervous/anxious.   All other systems reviewed and are negative.     Allergies  Review of patient's allergies indicates no known allergies.  Home Medications   Prior to Admission medications   Medication Sig Start Date End Date Taking? Authorizing Provider  albuterol (PROVENTIL HFA;VENTOLIN HFA) 108 (90 BASE) MCG/ACT inhaler Inhale 2 puffs into the lungs  every 6 (six) hours as needed for wheezing or shortness of breath. 09/08/13  Yes Jennifer Piepenbrink, PA-C  lisinopril (PRINIVIL,ZESTRIL) 10 MG tablet Take 1 tablet (10 mg total) by mouth daily. 06/16/14  Yes Gust Rung, DO  metFORMIN (GLUCOPHAGE) 1000 MG tablet Take 1 tablet (1,000 mg total) by mouth 2 (two) times daily with a meal. 06/16/14 06/16/15 Yes Gust Rung, DO  pravastatin (PRAVACHOL) 40 MG tablet Take 1 tablet (40 mg total) by mouth daily. 06/16/14  Yes Gust Rung, DO  ciprofloxacin (CIPRO) 500 MG tablet Take 1 tablet (500 mg total) by mouth 2 (two) times  daily. Patient not taking: Reported on 12/30/2014 12/08/14   Joycie Peek, PA-C  meclizine (ANTIVERT) 50 MG tablet Take 0.5 tablets (25 mg total) by mouth 3 (three) times daily as needed for dizziness. 12/30/14   Ambrose Finland Little, MD   BP 113/66 mmHg  Pulse 83  Temp(Src) 98.5 F (36.9 C)  Resp 18  SpO2 96% Physical Exam  Constitutional: She is oriented to person, place, and time. She appears well-developed and well-nourished. No distress.  HENT:  Head: Normocephalic and atraumatic.  Mouth/Throat: Oropharynx is clear and moist. No oropharyngeal exudate.  Mild bulging of left TM  Eyes: EOM are normal. Pupils are equal, round, and reactive to light.  No nystagmus appreciated.  Neck: Normal range of motion. Neck supple. No JVD present.  No meningismus.  Cardiovascular: Normal rate and regular rhythm.  Exam reveals no gallop and no friction rub.   No murmur heard. Pulmonary/Chest: Effort normal and breath sounds normal. No respiratory distress. She has no wheezes. She has no rales. She exhibits no tenderness.  Abdominal: Soft. Bowel sounds are normal. She exhibits no distension and no mass. There is no tenderness. There is no rebound and no guarding.  Musculoskeletal: Normal range of motion. She exhibits no edema or tenderness.  No lower extremity swelling or pain.  Lymphadenopathy:    She has no cervical adenopathy.  Neurological: She is alert and oriented to person, place, and time.  Patient is alert and oriented x3 with clear, goal oriented speech. Patient has 5/5 motor in all extremities. Sensation is intact to light touch. Bilateral finger-to-nose is normal with no signs of dysmetria.   Skin: Skin is warm and dry. No rash noted. No erythema.  Psychiatric:  Patient is very anxious with fine tremor.  Nursing note and vitals reviewed.   ED Course  Procedures (including critical care time) Labs Review Labs Reviewed  COMPREHENSIVE METABOLIC PANEL - Abnormal; Notable for the  following:    Sodium 134 (*)    Chloride 97 (*)    Glucose, Bld 310 (*)    BUN <5 (*)    Total Protein 8.3 (*)    AST 82 (*)    ALT 89 (*)    Alkaline Phosphatase 196 (*)    All other components within normal limits  URINALYSIS, ROUTINE W REFLEX MICROSCOPIC (NOT AT [facility]) - Abnormal; Notable for the following:    Glucose, UA >1000 (*)    Hgb urine dipstick TRACE (*)    Ketones, ur 15 (*)    Protein, ur 100 (*)    All other components within normal limits  ETHANOL - Abnormal; Notable for the following:    Alcohol, Ethyl (B) 43 (*)    All other components within normal limits  URINE MICROSCOPIC-ADD ON - Abnormal; Notable for the following:    Squamous Epithelial / LPF 0-5 (*)  Bacteria, UA FEW (*)    Casts GRANULAR CAST (*)    All other components within normal limits  CBG MONITORING, ED - Abnormal; Notable for the following:    Glucose-Capillary 264 (*)    All other components within normal limits  CBC WITH DIFFERENTIAL/PLATELET  URINE RAPID DRUG SCREEN, HOSP PERFORMED  I-STAT TROPOININ, ED    Imaging Review No results found. I have personally reviewed and evaluated these images and lab results as part of my medical decision-making.   EKG Interpretation   Date/Time:  Friday December 30 2014 13:06:50 EST Ventricular Rate:  59 PR Interval:  159 QRS Duration: 112 QT Interval:  414 QTC Calculation: 410 R Axis:   39 Text Interpretation:  Sinus rhythm Atrial premature complex Borderline  intraventricular conduction delay Confirmed by Carri Spillers  MD, Krystyna Cleckley  ([id number]) on 12/30/2014 1:37:19 PM      MDM   Final diagnoses:  Vertigo    Patient with what sounds like to be acute onset vertigo followed by anxiety. Given risk factors including hypertension diabetes or get MRI brain to rule out posterior stroke. Currently the patient is neurologically intact. Oncoming Emergency Physician Pending MRI.   Loren Racer, MD 01/05/15 [phone]

## 2014-12-30 NOTE — Consult Note (Signed)
Consult Reason for Consult: dizziness Referring Physician: Dr Ranae Palms ED  CC: dizziness  HPI: [name] is an 49 y.o. female hx of anxiety, HLD, HTN, DM presenting with acute onset of dizziness described as a vertigo type sensation. Symptoms suddenly began around 1100 today. Had associated nausea and difficulty walking. Became very anxious during the episode. The spinning sensation was worsened with head movement. Notes ringing in her left ear for the past one day. Denies any focal weakness, sensory deficits, speech or visual deficits. Currently asymptomatic. States her symptoms resolved after around 4 hours. Denies any history of vision changes, paresthesias, weakness.   MRI brain imaging reviewed, shows white matter signal changes concerning for possible MS.   Past Medical History  Diagnosis Date  . Tuberculosis     was treated  . Gonorrhea   . Chlamydia   . Breast abscess   . Substance abuse     alcohol  . Recurrent upper respiratory infection (URI)     URI/SOB-treated with antibiotics-cleared up now  . Depression     PTSD also  . Anemia     when in late teens  . Diabetes mellitus without complication (HCC)   . Hypertension   . Hypercholesteremia   . High cholesterol   . Seizures (HCC)   . Anxiety     Past Surgical History  Procedure Laterality Date  . Tongue surgery  2008    lump removed  . Incise and drain abcess  01/03/11    breast abscess  . Breast lumpectomy      Family History  Problem Relation Age of Onset  . Breast cancer Mother   . Breast cancer Sister   . Breast cancer Maternal Aunt     Social History:  reports that she quit smoking about 2 years ago. Her smoking use included Cigarettes. She has a 15 pack-year smoking history. She has never used smokeless tobacco. She reports that she drinks alcohol. She reports that she does not use illicit drugs.  No Known Allergies  Medications: I have reviewed the patient's current  medications.  ROS: Out of a complete 14 system review, the patient complains of only the following symptoms, and all other reviewed systems are negative. + vertigo  Physical Examination: Filed Vitals:   12/30/14 1902 12/30/14 1930  BP:  122/81  Pulse:  84  Temp: 98.5 F (36.9 C)   Resp:  21   Physical Exam  Constitutional: He appears well-developed and well-nourished.  Psych: Affect appropriate to situation Eyes: No scleral injection HENT: No OP obstrucion Head: Normocephalic.  Cardiovascular: Normal rate and regular rhythm.  Respiratory: Effort normal and breath sounds normal.  GI: Soft. Bowel sounds are normal. No distension. There is no tenderness.  Skin: WDI  Neurologic Examination Mental Status: Alert, oriented, thought content appropriate.  Speech fluent without evidence of aphasia.  Able to follow 3 step commands without difficulty. Cranial Nerves: II: funduscopic exam wnl bilaterally, visual fields grossly normal, pupils equal, round, reactive to light and accommodation III,IV, VI: ptosis not present, extra-ocular motions intact bilaterally V,VII: smile symmetric, facial light touch sensation normal bilaterally VIII: hearing normal bilaterally IX,X: gag reflex present XI: trapezius strength/neck flexion strength normal bilaterally XII: tongue strength normal  Motor: Right : Upper extremity    Left:     Upper extremity 5/5 deltoid       5/5 deltoid 5/5 biceps      5/5 biceps  5/5 triceps      5/5 triceps 5/5  hand grip      5/5 hand grip  Lower extremity     Lower extremity 5/5 hip flexor      5/5 hip flexor 5/5 quadricep      5/5 quadriceps  5/5 hamstrings     5/5 hamstrings 5/5 plantar flexion       5/5 plantar flexion 5/5 plantar extension     5/5 plantar extension Tone and bulk:normal tone throughout; no atrophy noted Sensory: Pinprick and light touch intact throughout, bilaterally Deep Tendon Reflexes: brisk throughout Plantars: Right:  downgoing   Left: downgoing Cerebellar: normal finger-to-nose, and normal heel-to-shin test Gait: deferred  Laboratory Studies:   Basic Metabolic Panel:  Recent Labs Lab 12/30/14 1339  NA 134*  K 4.1  CL 97*  CO2 23  GLUCOSE 310*  BUN <5*  CREATININE 0.51  CALCIUM 9.4    Liver Function Tests:  Recent Labs Lab 12/30/14 1339  AST 82*  ALT 89*  ALKPHOS 196*  BILITOT 0.9  PROT 8.3*  ALBUMIN 4.1   No results for input(s): LIPASE, AMYLASE in the last 168 hours. No results for input(s): AMMONIA in the last 168 hours.  CBC:  Recent Labs Lab 12/30/14 1339  WBC 4.9  NEUTROABS 3.0  HGB 13.4  HCT 38.9  MCV 83.3  PLT 218    Cardiac Enzymes: No results for input(s): CKTOTAL, CKMB, CKMBINDEX, TROPONINI in the last 168 hours.  BNP: Invalid input(s): POCBNP  CBG:  Recent Labs Lab 12/30/14 1453  GLUCAP 264*    Microbiology: Results for orders placed or performed during the hospital encounter of 12/08/14  Urine culture     Status: None   Collection Time: 12/08/14  6:30 PM  Result Value Ref Range Status   Specimen Description URINE, RANDOM  Final   Special Requests Immunocompromised  Final   Culture >=100,000 COLONIES/mL ESCHERICHIA COLI  Final   Report Status 12/11/2014 FINAL  Final   Organism ID, Bacteria ESCHERICHIA COLI  Final      Susceptibility   Escherichia coli - MIC*    AMPICILLIN <=2 SENSITIVE Sensitive     CEFAZOLIN <=4 SENSITIVE Sensitive     CEFTRIAXONE <=1 SENSITIVE Sensitive     CIPROFLOXACIN <=0.25 SENSITIVE Sensitive     GENTAMICIN <=1 SENSITIVE Sensitive     IMIPENEM <=0.25 SENSITIVE Sensitive     NITROFURANTOIN <=16 SENSITIVE Sensitive     TRIMETH/SULFA <=20 SENSITIVE Sensitive     AMPICILLIN/SULBACTAM <=2 SENSITIVE Sensitive     PIP/TAZO <=4 SENSITIVE Sensitive     * >=100,000 COLONIES/mL ESCHERICHIA COLI    Coagulation Studies: No results for input(s): LABPROT, INR in the last 72 hours.  Urinalysis:  Recent Labs Lab  12/30/14 1501  COLORURINE YELLOW  LABSPEC 1.026  PHURINE 5.0  GLUCOSEU >1000*  HGBUR TRACE*  BILIRUBINUR NEGATIVE  KETONESUR 15*  PROTEINUR 100*  NITRITE NEGATIVE  LEUKOCYTESUR NEGATIVE    Lipid Panel:     Component Value Date/Time   CHOL 183 06/16/2014 1528   TRIG 184* 06/16/2014 1528   HDL 31* 06/16/2014 1528   CHOLHDL 5.9 06/16/2014 1528   VLDL 37 06/16/2014 1528   LDLCALC 115* 06/16/2014 1528    HgbA1C:  Lab Results  Component Value Date   HGBA1C 10.1 06/16/2014    Urine Drug Screen:     Component Value Date/Time   LABOPIA NONE DETECTED 12/30/2014 1501   COCAINSCRNUR NONE DETECTED 12/30/2014 1501   LABBENZ NONE DETECTED 12/30/2014 1501   AMPHETMU NONE DETECTED 12/30/2014 1501  THCU NONE DETECTED 12/30/2014 1501   LABBARB NONE DETECTED 12/30/2014 1501    Alcohol Level:  Recent Labs Lab 12/30/14 1339  ETH 43*    Other results:  Imaging: Mr Brain Wo Contrast  12/30/2014  CLINICAL DATA:  49 year old female with vertigo, shaking, not taking blood pressure medication. Initial encounter. EXAM: MRI HEAD WITHOUT CONTRAST TECHNIQUE: Multiplanar, multiecho pulse sequences of the brain and surrounding structures were obtained without intravenous contrast. COMPARISON:  Head CT without contrast 1027 hours today, and earlier. FINDINGS: No restricted diffusion to suggest acute infarction. No midline shift, mass effect, evidence of mass lesion, ventriculomegaly, extra-axial collection or acute intracranial hemorrhage. Cervicomedullary junction and pituitary are within normal limits. Major intracranial vascular flow voids are within normal limits. Nodular periventricular white matter T2 and FLAIR hyperintensity which was relatively subtle by CT. Axial images suggest some lesions are oriented perpendicular to the lateral ventricles. No temporal lobe involvement identified. No chronic cerebral blood products. No cortical encephalomalacia. Deep gray matter nuclei, brainstem, and  cerebellum are within normal limits. Visible internal auditory structures appear normal. Mastoids are clear. Trace paranasal sinus mucosal thickening. Orbits soft tissues appear within normal limits. There is chronic scalp soft tissue scarring over the vertex on the right (series 10, image 11). Visualized bone marrow signal is within normal limits. Negative visualized cervical spine. IMPRESSION: 1. Moderately age advanced white matter signal abnormality in a configuration suspicious for Multiple Sclerosis. Other differential considerations include accelerated small vessel ischemia, sequelae of trauma, hypercoagulable state, vasculitis, migraines, or prior infection. 2. Otherwise no acute intracranial abnormality. Electronically Signed   By: Odessa Fleming M.D.   On: 12/30/2014 16:39     Assessment/Plan:  49y/o woman with history of anxiety presenting with acute onset of vertigo. No associated motor or sensory deficits. Symptoms worsened with movement of head. Currently asymptomatic. MRI brain imaging shows no acute process though with noted white matter changes concerning for possible MS. Patient reports feeling back to baseline and does not wish to stay for further evaluation.  -meclizine prn  -outpatient neurology follow up for further evaluation of abnormal MRI findings   Elspeth Cho, DO [facility] [phone]  If 7pm- 7am, please page neurology on call as listed in AMION. 12/30/2014, 8:28 PM

## 2014-12-30 NOTE — ED Provider Notes (Signed)
[facility] Per his report, the patient presented with a sudden onset of vertigo symptoms associated with nausea and vomiting. The patient has several risk factors for stroke including diabetes and hypertension therefore MRI was ordered to evaluate for posterior circulation stroke. MRI showed no evidence of acute stroke but did show diffuse white matter disease concerning for MS. I discussed patient with neurology, Dr.Sumner, and I appreciate his assistance with the patient's care. He evaluated the patient and felt that her symptoms of vertigo were likely not representative of MS symptoms and patient can be evaluated in neurology clinic. He has ordered f/u appointment. Discussed f/u plan and supportive care instructions w/ patient and gave her meclizine. On reexamination, the patient is well-appearing and ambulatory. Patient discharged in satisfactory condition.  Laurence Spatesachel Morgan Cortina Vultaggio, MD 12/30/14 2112

## 2015-01-05 ENCOUNTER — Encounter: Payer: Self-pay | Admitting: Internal Medicine

## 2015-01-09 ENCOUNTER — Ambulatory Visit: Payer: Self-pay | Admitting: Neurology

## 2015-01-09 ENCOUNTER — Telehealth: Payer: Self-pay | Admitting: *Deleted

## 2015-01-09 NOTE — Telephone Encounter (Signed)
no showed new pt appt 

## 2015-01-10 ENCOUNTER — Encounter: Payer: Self-pay | Admitting: Neurology

## 2015-01-12 ENCOUNTER — Other Ambulatory Visit: Payer: Self-pay

## 2015-03-03 ENCOUNTER — Ambulatory Visit: Payer: Self-pay | Admitting: Internal Medicine

## 2015-03-10 ENCOUNTER — Ambulatory Visit (INDEPENDENT_AMBULATORY_CARE_PROVIDER_SITE_OTHER): Payer: Self-pay | Admitting: Internal Medicine

## 2015-03-10 ENCOUNTER — Encounter: Payer: Self-pay | Admitting: Internal Medicine

## 2015-03-10 VITALS — BP 168/88 | HR 76 | Temp 98.3°F | Ht 64.0 in | Wt 172.1 lb

## 2015-03-10 DIAGNOSIS — IMO0001 Reserved for inherently not codable concepts without codable children: Secondary | ICD-10-CM

## 2015-03-10 DIAGNOSIS — R930 Abnormal findings on diagnostic imaging of skull and head, not elsewhere classified: Secondary | ICD-10-CM

## 2015-03-10 DIAGNOSIS — E1165 Type 2 diabetes mellitus with hyperglycemia: Secondary | ICD-10-CM

## 2015-03-10 DIAGNOSIS — R9089 Other abnormal findings on diagnostic imaging of central nervous system: Secondary | ICD-10-CM

## 2015-03-10 DIAGNOSIS — I10 Essential (primary) hypertension: Secondary | ICD-10-CM

## 2015-03-10 DIAGNOSIS — R809 Proteinuria, unspecified: Secondary | ICD-10-CM

## 2015-03-10 DIAGNOSIS — M1612 Unilateral primary osteoarthritis, left hip: Secondary | ICD-10-CM | POA: Insufficient documentation

## 2015-03-10 DIAGNOSIS — E1169 Type 2 diabetes mellitus with other specified complication: Secondary | ICD-10-CM

## 2015-03-10 DIAGNOSIS — M1652 Unilateral post-traumatic osteoarthritis, left hip: Secondary | ICD-10-CM

## 2015-03-10 DIAGNOSIS — Z7984 Long term (current) use of oral hypoglycemic drugs: Secondary | ICD-10-CM

## 2015-03-10 DIAGNOSIS — R7401 Elevation of levels of liver transaminase levels: Secondary | ICD-10-CM | POA: Insufficient documentation

## 2015-03-10 DIAGNOSIS — R740 Nonspecific elevation of levels of transaminase and lactic acid dehydrogenase [LDH]: Secondary | ICD-10-CM

## 2015-03-10 LAB — GLUCOSE, CAPILLARY: GLUCOSE-CAPILLARY: 92 mg/dL (ref 65–99)

## 2015-03-10 LAB — POCT GLYCOSYLATED HEMOGLOBIN (HGB A1C): HEMOGLOBIN A1C: 8.2

## 2015-03-10 MED ORDER — LISINOPRIL 20 MG PO TABS: 20.0000 mg | ORAL_TABLET | Freq: Every day | ORAL | Status: DC

## 2015-03-10 MED ORDER — MECLIZINE HCL 50 MG PO TABS: 25.0000 mg | ORAL_TABLET | Freq: Three times a day (TID) | ORAL | Status: DC | PRN

## 2015-03-10 NOTE — Progress Notes (Signed)
Internal Medicine Clinic Attending  Case discussed with Dr. Hoffman at the time of the visit.  We reviewed the resident's history and exam and pertinent patient test results.  I agree with the assessment, diagnosis, and plan of care documented in the resident's note.  

## 2015-03-10 NOTE — Assessment & Plan Note (Signed)
HPI: She reports she fell on her left hip about 1.5 years ago. She has continued to have some intermittent pain in the left hip that is worsened with walking and exercise.  She takes occasional OTC Aleve which helps.  She describes the pain as sharp and shooting.  A: OA of left hip  P: -Continue Naproxen PRN

## 2015-03-10 NOTE — Assessment & Plan Note (Signed)
HPI: She has continued to have intermittent episodes of vertigo that is relieved with meclizine.  Otherwise she denies other neurologic complaints.  A: Abnormal brain MRI  P: I discussed the importance of Neurology follow up and she will call to reschedule her appointment -I will give her a limited supply of meclizine and have close follow up in 1 month.

## 2015-03-10 NOTE — Patient Instructions (Signed)
General Instructions:  I want you to see neurology about your abnormal MRI.  Continue taking Naproxen 1-2 pills twice a day for occaisonal hip pain.  Increase lisinopril to  a day.  Please bring your medicines with you each time you come to clinic.  Medicines may include prescription medications, over-the-counter medications, herbal remedies, eye drops, vitamins, or other pills.   Progress Toward Treatment Goals:  Treatment Goal 06/16/2014  Hemoglobin A1C deteriorated  Blood pressure at goal  Stop smoking -    Self Care Goals & Plans:  Self Care Goal 03/10/2015  Manage my medications take my medicines as prescribed; refill my medications on time; bring my medications to every visit  Monitor my health keep track of my blood pressure; bring my glucose meter and log to each visit; keep track of my blood glucose; check my feet daily  Eat healthy foods drink diet soda or water instead of juice or soda; eat more vegetables; eat foods that are low in salt; eat baked foods instead of fried foods  Be physically active find an activity I enjoy    Home Blood Glucose Monitoring 06/16/2014  Check my blood sugar once a day  When to check my blood sugar before breakfast     Care Management & Community Referrals:  Referral 06/16/2014  Referrals made for care management support none needed

## 2015-03-10 NOTE — Assessment & Plan Note (Signed)
A: Transaminitis  P: Review of ER visits shows continued elevations in both AST and ALT as well as alk phos.  Reivew of chart shows negative acute hepatitis panel in 2015. - Given cost concerns will start with repeat CMP today, we likely will need to obtain an Ultrasound of the liver if she continues to have transaminitis

## 2015-03-10 NOTE — Progress Notes (Signed)
[facility] Subjective:   Patient ID: [name] female   DOB: 1965/03/04 50 y.o.   MRN: [id number]  HPI: [name] is a 50 y.o. female with a PMH detailed below who presents for left groin pain.  She is also overdue for her routine visit for DM and HTN. Since her last visit 9 months ago she has had 5 ED/ Urgent care visits as follows 12/30/14: Evaluated for vertigo: MRI ordered to eval for stoke did not show stroke but diffuse white matter disease concerning for MS. Neurology recommended out patient follow up however Ms Hajjar did not follow up with Neuro and no showed her appointment.  11/28/14 Evaluated in Urgent care then in ED for hyponatremia and UTI, she was treated with antibiotics and instructed to follow up with me in 48 hours which she did not. 07/18/14 Evaluated for hypoglycemia, BS stable and discharged with instructions to follow up with PCP 06/26/14 Per nursing ED notes she reported to the ED after a "seziure" she was disoriented but when patient was called back to be evaluated she was not longer present in the ED.    Please see problem based charting below for the status of her chronic medical problems.    Past Medical History  Diagnosis Date  . Tuberculosis     was treated  . Gonorrhea   . Chlamydia   . Breast abscess   . Substance abuse     alcohol  . Recurrent upper respiratory infection (URI)     URI/SOB-treated with antibiotics-cleared up now  . Depression     PTSD also  . Anemia     when in late teens  . Diabetes mellitus without complication ([place])   . Hypertension   . Hypercholesteremia   . High cholesterol   . Seizures ([place])   . Anxiety    Current Outpatient Prescriptions  Medication Sig Dispense Refill  . albuterol (PROVENTIL HFA;VENTOLIN HFA) 108 (90 BASE) MCG/ACT inhaler Inhale 2 puffs into the lungs every 6 (six) hours as needed for wheezing or shortness of breath. 1 Inhaler 2  . ciprofloxacin (CIPRO)  500 MG tablet Take 1 tablet (500 mg total) by mouth 2 (two) times daily. (Patient not taking: Reported on 12/30/2014) 14 tablet 0  . lisinopril (PRINIVIL,ZESTRIL) 10 MG tablet Take 1 tablet (10 mg total) by mouth daily. 90 tablet 3  . meclizine (ANTIVERT) 50 MG tablet Take 0.5 tablets (25 mg total) by mouth 3 (three) times daily as needed for dizziness. 10 tablet 0  . metFORMIN (GLUCOPHAGE) 1000 MG tablet Take 1 tablet (1,000 mg total) by mouth 2 (two) times daily with a meal. 180 tablet 3  . pravastatin (PRAVACHOL) 40 MG tablet Take 1 tablet (40 mg total) by mouth daily. 90 tablet 3   No current facility-administered medications for this visit.   Family History  Problem Relation Age of Onset  . Breast cancer Mother   . Breast cancer Sister   . Breast cancer Maternal Aunt    Social History   Social History  . Marital Status: Single    Spouse Name: N/A  . Number of Children: N/A  . Years of Education: GED 1987   Occupational History  . unemployed    Social History Main Topics  . Smoking status: Former Smoker -- 0.50 packs/day for 30 years    Types: Cigarettes    Quit date: 12/11/2012  . Smokeless tobacco: Never Used     Comment: None in  5 days  . Alcohol Use: 0.0 oz/week    14-16 Cans of beer per week     Comment: last drink today 6/5  . Drug Use: No     Comment: sober for 11 yrs  . Sexual Activity: Not Currently    Birth Control/ Protection: None   Other Topics Concern  . Not on file   Social History Narrative   Review of Systems: Review of Systems  Respiratory: Negative for cough.   Cardiovascular: Negative for chest pain.  Gastrointestinal: Negative for abdominal pain.  Genitourinary: Negative for dysuria and frequency.  Musculoskeletal: Positive for joint pain. Negative for falls.  Neurological: Negative for headaches.  Endo/Heme/Allergies: Negative for polydipsia.     Objective:  Physical Exam: Filed Vitals:   03/10/15 1326  BP: 168/88  Pulse: 76  Temp:  98.3 F (36.8 C)  TempSrc: Oral  Height: '5\' 4"'$  (1.626 m)  Weight: 172 lb 1.6 oz (78.064 kg)  SpO2: 99%   Physical Exam  Constitutional: She is oriented to person, place, and time and well-developed, well-nourished, and in no distress.  Cardiovascular: Normal rate and regular rhythm.   Pulmonary/Chest: Effort normal and breath sounds normal.  Musculoskeletal:       Right hip: She exhibits normal range of motion.       Left hip: She exhibits normal range of motion and no tenderness.  Pain with flexion of hip and internal rotation  Neurological: She is alert and oriented to person, place, and time.  Nursing note and vitals reviewed.    Assessment & Plan:  Case discussed with Dr. Dareen Piano  Hypertension HPI: Has been taking lisinopril '10mg'$  daily for blood pressure. No complaints  A: Essential HTN not at goal  P: - Increase lisinopril to '20mg'$  daily, discussed increasing exercise. 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  A: Uncontrolled Type 2 DM with micoalbuminuria  P: -Will continue her Metformin 1G BID, she cannot afford newer medications and with her alcoholism and history of hypoglycemia I am concerned about adding a sulfonylurea.  I will not yet abondon the A1c goal of 7 as I think she can get there with diet and exercise but would consider relaxing the goal to 8 given her other co-morbities and hypoglycemia.  Transaminitis A: Transaminitis  P: Review of ER visits shows continued elevations in both AST and ALT as well as alk phos.  Reivew of chart shows negative acute hepatitis panel in 2015. - Given cost concerns will start with repeat CMP today, we likely will need to obtain an Ultrasound of the liver if she continues to have  transaminitis   Abnormal brain MRI HPI: She has continued to have intermittent episodes of vertigo that is relieved with meclizine.  Otherwise she denies other neurologic complaints.  A: Abnormal brain MRI  P: I discussed the importance of Neurology follow up and she will call to reschedule her appointment -I will give her a limited supply of meclizine and have close follow up in 1 month.  Osteoarthritis of left hip HPI: She reports she fell on her left hip about 1.5 years ago. She has continued to have some intermittent pain in the left hip that is worsened with walking and exercise.  She takes occasional OTC Aleve which helps.  She describes the  pain as sharp and shooting.  A: OA of left hip  P: -Continue Naproxen PRN    Medications Ordered Meds ordered this encounter  Medications  . lisinopril (PRINIVIL,ZESTRIL) 20 MG tablet    Sig: Take 1 tablet (20 mg total) by mouth daily.    Dispense:  90 tablet    Refill:  1  . meclizine (ANTIVERT) 50 MG tablet    Sig: Take 0.5 tablets (25 mg total) by mouth 3 (three) times daily as needed for dizziness.    Dispense:  20 tablet    Refill:  0   Other Orders Orders Placed This Encounter  Procedures  . Glucose, capillary  . CMP14 + Anion Gap  . Ambulatory referral to diabetic education    Referral Priority:  Routine    Referral Type:  Consultation    Referral Reason:  Specialty Services Required    Referred to Provider:  Chauncey Reading Plyler, RD    Number of Visits Requested:  1  . POCT HgB A1C (CPT [phone])   Follow Up: Return in about 1 month (around 04/07/2015).

## 2015-03-10 NOTE — Assessment & Plan Note (Signed)
HPI: Taking Metformin 1G BID, no polyuria or polydispia, no complaints, no recent hypoglycemia, review of Glucometer download shows range of 80-266 with most readings in 140s range.  A: Uncontrolled Type 2 DM with micoalbuminuria  P: -Will continue her Metformin 1G BID, she cannot afford newer medications and with her alcoholism and history of hypoglycemia I am concerned about adding a sulfonylurea.  I will not yet abondon the A1c goal of 7 as I think she can get there with diet and exercise but would consider relaxing the goal to 8 given her other co-morbities and hypoglycemia.

## 2015-03-10 NOTE — Assessment & Plan Note (Signed)
HPI: Has been taking lisinopril  daily for blood pressure. No complaints  A: Essential HTN not at goal  P: - Increase lisinopril to  daily, discussed increasing exercise. -F/U in 1 month

## 2015-03-11 LAB — CMP14 + ANION GAP
ALBUMIN: 4.3 g/dL (ref 3.5–5.5)
ALT: 91 IU/L — AB (ref 0–32)
ANION GAP: 20 mmol/L — AB (ref 10.0–18.0)
AST: 83 IU/L — ABNORMAL HIGH (ref 0–40)
Albumin/Globulin Ratio: 1.3 (ref 1.1–2.5)
Alkaline Phosphatase: 190 IU/L — ABNORMAL HIGH (ref 39–117)
BUN / CREAT RATIO: 10 (ref 9–23)
BUN: 5 mg/dL — ABNORMAL LOW (ref 6–24)
Bilirubin Total: 0.5 mg/dL (ref 0.0–1.2)
CHLORIDE: 88 mmol/L — AB (ref 96–106)
CO2: 21 mmol/L (ref 18–29)
CREATININE: 0.51 mg/dL — AB (ref 0.57–1.00)
Calcium: 9.2 mg/dL (ref 8.7–10.2)
GFR calc non Af Amer: 114 mL/min/{1.73_m2} (ref >59)
GFR, EST AFRICAN AMERICAN: 131 mL/min/{1.73_m2} (ref >59)
GLOBULIN, TOTAL: 3.2 g/dL (ref 1.5–4.5)
Glucose: 85 mg/dL (ref 65–99)
POTASSIUM: 4.4 mmol/L (ref 3.5–5.2)
Sodium: 129 mmol/L — ABNORMAL LOW (ref 134–144)
TOTAL PROTEIN: 7.5 g/dL (ref 6.0–8.5)

## 2015-03-13 ENCOUNTER — Other Ambulatory Visit: Payer: Self-pay

## 2015-03-13 MED ORDER — MECLIZINE HCL 25 MG PO TABS: 25.0000 mg | ORAL_TABLET | Freq: Three times a day (TID) | ORAL | Status: DC | PRN

## 2015-03-13 NOTE — Telephone Encounter (Signed)
Pharmacy asking if order can be updated to  tablets instead of .  Better cost for patient

## 2015-03-13 NOTE — Telephone Encounter (Signed)
Ok done

## 2015-03-17 ENCOUNTER — Other Ambulatory Visit: Payer: Self-pay | Admitting: Internal Medicine

## 2015-03-17 MED ORDER — ALBUTEROL SULFATE HFA 108 (90 BASE) MCG/ACT IN AERS: 2.0000 | INHALATION_SPRAY | Freq: Four times a day (QID) | RESPIRATORY_TRACT | Status: DC | PRN

## 2015-03-17 NOTE — Telephone Encounter (Signed)
Pt requesting albuterol inhaler to be filled @ [facility] on [address].

## 2015-03-29 ENCOUNTER — Other Ambulatory Visit: Payer: Self-pay

## 2015-03-29 ENCOUNTER — Other Ambulatory Visit: Payer: Self-pay | Admitting: Internal Medicine

## 2015-03-29 DIAGNOSIS — Z1231 Encounter for screening mammogram for malignant neoplasm of breast: Secondary | ICD-10-CM

## 2015-04-06 ENCOUNTER — Encounter: Payer: Self-pay | Admitting: Dietician

## 2015-04-06 ENCOUNTER — Encounter: Payer: Self-pay | Admitting: Internal Medicine

## 2015-04-12 ENCOUNTER — Encounter: Payer: Self-pay | Admitting: Dietician

## 2015-04-12 ENCOUNTER — Ambulatory Visit (INDEPENDENT_AMBULATORY_CARE_PROVIDER_SITE_OTHER): Payer: Self-pay | Admitting: Internal Medicine

## 2015-04-12 ENCOUNTER — Encounter: Payer: Self-pay | Admitting: Internal Medicine

## 2015-04-12 VITALS — BP 174/84 | HR 76 | Temp 98.1°F | Ht 64.0 in | Wt 174.5 lb

## 2015-04-12 DIAGNOSIS — I10 Essential (primary) hypertension: Secondary | ICD-10-CM

## 2015-04-12 DIAGNOSIS — E1129 Type 2 diabetes mellitus with other diabetic kidney complication: Secondary | ICD-10-CM

## 2015-04-12 DIAGNOSIS — R809 Proteinuria, unspecified: Secondary | ICD-10-CM

## 2015-04-12 DIAGNOSIS — Z79899 Other long term (current) drug therapy: Secondary | ICD-10-CM

## 2015-04-12 DIAGNOSIS — Z7984 Long term (current) use of oral hypoglycemic drugs: Secondary | ICD-10-CM

## 2015-04-12 DIAGNOSIS — F17200 Nicotine dependence, unspecified, uncomplicated: Secondary | ICD-10-CM

## 2015-04-12 DIAGNOSIS — F17210 Nicotine dependence, cigarettes, uncomplicated: Secondary | ICD-10-CM

## 2015-04-12 DIAGNOSIS — IMO0001 Reserved for inherently not codable concepts without codable children: Secondary | ICD-10-CM

## 2015-04-12 LAB — GLUCOSE, CAPILLARY: GLUCOSE-CAPILLARY: 93 mg/dL (ref 65–99)

## 2015-04-12 MED ORDER — ALBUTEROL SULFATE HFA 108 (90 BASE) MCG/ACT IN AERS: 2.0000 | INHALATION_SPRAY | Freq: Four times a day (QID) | RESPIRATORY_TRACT | Status: DC | PRN

## 2015-04-12 MED ORDER — LISINOPRIL 20 MG PO TABS: 20.0000 mg | ORAL_TABLET | Freq: Every day | ORAL | Status: DC

## 2015-04-12 MED ORDER — HYDROCHLOROTHIAZIDE 12.5 MG PO CAPS: 12.5000 mg | ORAL_CAPSULE | Freq: Every day | ORAL | Status: DC

## 2015-04-12 NOTE — Assessment & Plan Note (Signed)
Patient continues to smoke 1 pack per 4-5 days.  She has been cutting back and plans to continue efforts to quit.  She uses albuterol inhaler for PRN relief SOB.  She is requesting albuterol refill. - Albuterol refilled

## 2015-04-12 NOTE — Progress Notes (Signed)
Patient ID: [name], female   DOB: Oct 22, 1965, 50 y.o.   MRN: [id number]   Subjective:   Patient ID: [name] female   DOB: 06/22/1965 50 y.o.   MRN: [id number]  HPI: [name] is a 50 y.o. female with PMH as below, here for DMII and HTN f/u.  Please see Problem-Based charting for the status of the patient's chronic medical issues.     Past Medical History  Diagnosis Date  . Tuberculosis     was treated  . Gonorrhea   . Chlamydia   . Breast abscess   . Substance abuse     alcohol  . Recurrent upper respiratory infection (URI)     URI/SOB-treated with antibiotics-cleared up now  . Depression     PTSD also  . Anemia     when in late teens  . Diabetes mellitus without complication (HCC)   . Hypertension   . Hypercholesteremia   . High cholesterol   . Seizures (HCC)   . Anxiety    Current Outpatient Prescriptions  Medication Sig Dispense Refill  . albuterol (PROVENTIL HFA;VENTOLIN HFA) 108 (90 Base) MCG/ACT inhaler Inhale 2 puffs into the lungs every 6 (six) hours as needed for wheezing or shortness of breath. 1 Inhaler 2  . lisinopril (PRINIVIL,ZESTRIL) 20 MG tablet Take 1 tablet (20 mg total) by mouth daily. 90 tablet 1  . meclizine (ANTIVERT) 25 MG tablet Take 1 tablet (25 mg total) by mouth 3 (three) times daily as needed for dizziness. 40 tablet 0  . metFORMIN (GLUCOPHAGE) 1000 MG tablet Take 1 tablet (1,000 mg total) by mouth 2 (two) times daily with a meal. 180 tablet 3  . pravastatin (PRAVACHOL) 40 MG tablet Take 1 tablet (40 mg total) by mouth daily. 90 tablet 3   No current facility-administered medications for this visit.   Family History  Problem Relation Age of Onset  . Breast cancer Mother   . Breast cancer Sister   . Breast cancer Maternal Aunt    Social History   Social History  . Marital Status: Single    Spouse Name: N/A  . Number of Children: N/A  . Years of Education: GED 1987   Occupational History  .  unemployed    Social History Main Topics  . Smoking status: Former Smoker -- 0.50 packs/day for 30 years    Types: Cigarettes    Quit date: 12/11/2012  . Smokeless tobacco: Never Used  . Alcohol Use: 0.0 oz/week    14-16 Cans of beer per week     Comment: Beer.  . Drug Use: No  . Sexual Activity: Not on file   Other Topics Concern  . Not on file   Social History Narrative   Review of Systems: Patient denies CP, HA, blurry vision, or numbness/tingling.  She has intermittent SOB relieved with albuterol inhaler. Objective:  Physical Exam: There were no vitals filed for this visit. Physical Exam  Constitutional: She is oriented to person, place, and time and well-developed, well-nourished, and in no distress. No distress.  HENT:  Head: Normocephalic and atraumatic.  Eyes: EOM are normal. No scleral icterus.  Neck: No tracheal deviation present.  Cardiovascular: Normal rate, regular rhythm and normal heart sounds.   Pulmonary/Chest: Effort normal and breath sounds normal. No stridor. No respiratory distress. She has no wheezes.  Abdominal: She exhibits no distension. There is no tenderness. There is no rebound and no guarding.  Neurological: She is alert and oriented to  person, place, and time.  Skin: Skin is warm and dry. She is not diaphoretic.    Assessment & Plan:   Patient and case were discussed with Dr. Heide SparkNarendra.  Please refer to Problem Based charting for further documentation.

## 2015-04-12 NOTE — Assessment & Plan Note (Signed)
BP Readings from Last 3 Encounters:  04/12/15 174/84  03/10/15 168/88  12/30/14 113/66    Lab Results  Component Value Date   NA 129* 03/10/2015   K 4.4 03/10/2015   CREATININE 0.51* 03/10/2015    Assessment: Blood pressure control:  poor Progress toward BP goal:    Comments: Patient taking Lisinopril 20 mg daily  Plan: Medications:  Continue Lisinopril 20 mg daily.  Add HCTZ 12.5 mg daily Educational resources provided:   Self management tools provided:   Other plans: RTC 1 month

## 2015-04-12 NOTE — Patient Instructions (Signed)
1. Continue Lisinopril 20 mg daily. 2. Start HCTZ 12.5 mg daily. 3. Continue efforts to stop smoking. 4. Return to clinic in 1 month.  Smoking Cessation, Tips for Success If you are ready to quit smoking, congratulations! You have chosen to help yourself be healthier. Cigarettes bring nicotine, tar, carbon monoxide, and other irritants into your body. Your lungs, heart, and blood vessels will be able to work better without these poisons. There are many different ways to quit smoking. Nicotine gum, nicotine patches, a nicotine inhaler, or nicotine nasal spray can help with physical craving. Hypnosis, support groups, and medicines help break the habit of smoking. WHAT THINGS CAN I DO TO MAKE QUITTING EASIER?  Here are some tips to help you quit for good:  Pick a date when you will quit smoking completely. Tell all of your friends and family about your plan to quit on that date.  Do not try to slowly cut down on the number of cigarettes you are smoking. Pick a quit date and quit smoking completely starting on that day.  Throw away all cigarettes.   Clean and remove all ashtrays from your home, work, and car.  On a card, write down your reasons for quitting. Carry the card with you and read it when you get the urge to smoke.  Cleanse your body of nicotine. Drink enough water and fluids to keep your urine clear or pale yellow. Do this after quitting to flush the nicotine from your body.  Learn to predict your moods. Do not let a bad situation be your excuse to have a cigarette. Some situations in your life might tempt you into wanting a cigarette.  Never have "just one" cigarette. It leads to wanting another and another. Remind yourself of your decision to quit.  Change habits associated with smoking. If you smoked while driving or when feeling stressed, try other activities to replace smoking. Stand up when drinking your coffee. Brush your teeth after eating. Sit in a different chair when you  read the paper. Avoid alcohol while trying to quit, and try to drink fewer caffeinated beverages. Alcohol and caffeine may urge you to smoke.  Avoid foods and drinks that can trigger a desire to smoke, such as sugary or spicy foods and alcohol.  Ask people who smoke not to smoke around you.  Have something planned to do right after eating or having a cup of coffee. For example, plan to take a walk or exercise.  Try a relaxation exercise to calm you down and decrease your stress. Remember, you may be tense and nervous for the first 2 weeks after you quit, but this will pass.  Find new activities to keep your hands busy. Play with a pen, coin, or rubber band. Doodle or draw things on paper.  Brush your teeth right after eating. This will help cut down on the craving for the taste of tobacco after meals. You can also try mouthwash.   Use oral substitutes in place of cigarettes. Try using lemon drops, carrots, cinnamon sticks, or chewing gum. Keep them handy so they are available when you have the urge to smoke.  When you have the urge to smoke, try deep breathing.  Designate your home as a nonsmoking area.  If you are a heavy smoker, ask your health care provider about a prescription for nicotine chewing gum. It can ease your withdrawal from nicotine.  Reward yourself. Set aside the cigarette money you save and buy yourself something nice.  Look for support from others. Join a support group or smoking cessation program. Ask someone at home or at work to help you with your plan to quit smoking.  Always ask yourself, "Do I need this cigarette or is this just a reflex?" Tell yourself, "Today, I choose not to smoke," or "I do not want to smoke." You are reminding yourself of your decision to quit.  Do not replace cigarette smoking with electronic cigarettes (commonly called e-cigarettes). The safety of e-cigarettes is unknown, and some may contain harmful chemicals.  If you relapse, do not  give up! Plan ahead and think about what you will do the next time you get the urge to smoke. HOW WILL I FEEL WHEN I QUIT SMOKING? You may have symptoms of withdrawal because your body is used to nicotine (the addictive substance in cigarettes). You may crave cigarettes, be irritable, feel very hungry, cough often, get headaches, or have difficulty concentrating. The withdrawal symptoms are only temporary. They are strongest when you first quit but will go away within 10-14 days. When withdrawal symptoms occur, stay in control. Think about your reasons for quitting. Remind yourself that these are signs that your body is healing and getting used to being without cigarettes. Remember that withdrawal symptoms are easier to treat than the major diseases that smoking can cause.  Even after the withdrawal is over, expect periodic urges to smoke. However, these cravings are generally short lived and will go away whether you smoke or not. Do not smoke! WHAT RESOURCES ARE AVAILABLE TO HELP ME QUIT SMOKING? Your health care provider can direct you to community resources or hospitals for support, which may include:  Group support.  Education.  Hypnosis.  Therapy.   This information is not intended to replace advice given to you by your health care provider. Make sure you discuss any questions you have with your health care provider.   Document Released: 10/06/2003 Document Revised: 01/28/2014 Document Reviewed: 06/25/2012 Elsevier Interactive Patient Education [employer]2016 [employer]vier Inc.

## 2015-04-12 NOTE — Assessment & Plan Note (Signed)
Lab Results  Component Value Date   HGBA1C 8.2 03/10/2015   HGBA1C 10.1 06/16/2014   HGBA1C 5.4 08/12/2013     Assessment: Diabetes control:  fair Progress toward A1C goal:   improving Comments: Patient has changed diet, cutting sodas, white carbs, and fried foods.  She exercises 2-3 times/week.  Her numbness/tingling of feet has resolved.  Plan: Medications:  continue current medications, Metformin 1g BID Home glucose monitoring: Frequency:   Timing:   Instruction/counseling given: reminded to bring blood glucose meter & log to each visit and reminded to bring medications to each visit Educational resources provided:   Self management tools provided:   Other plans: Recheck A1c in 2 months

## 2015-04-12 NOTE — Progress Notes (Signed)
Internal Medicine Clinic Attending  Case discussed with Dr. Taylor at the time of the visit.  We reviewed the resident's history and exam and pertinent patient test results.  I agree with the assessment, diagnosis, and plan of care documented in the resident's note. 

## 2015-04-13 LAB — BMP8+ANION GAP
Anion Gap: 20 mmol/L — ABNORMAL HIGH (ref 10.0–18.0)
BUN / CREAT RATIO: 16 (ref 9–23)
BUN: 9 mg/dL (ref 6–24)
CHLORIDE: 85 mmol/L — AB (ref 96–106)
CO2: 20 mmol/L (ref 18–29)
Calcium: 9.5 mg/dL (ref 8.7–10.2)
Creatinine, Ser: 0.56 mg/dL — ABNORMAL LOW (ref 0.57–1.00)
GFR calc non Af Amer: 111 mL/min/{1.73_m2} (ref >59)
GFR, EST AFRICAN AMERICAN: 128 mL/min/{1.73_m2} (ref >59)
GLUCOSE: 95 mg/dL (ref 65–99)
POTASSIUM: 5.1 mmol/L (ref 3.5–5.2)
SODIUM: 125 mmol/L — AB (ref 134–144)

## 2015-04-18 ENCOUNTER — Encounter: Payer: Self-pay | Admitting: Dietician

## 2015-04-24 ENCOUNTER — Ambulatory Visit
Admission: RE | Admit: 2015-04-24 | Discharge: 2015-04-24 | Disposition: A | Discharge status: Home / Self Care | Payer: No Typology Code available for payment source | Source: Ambulatory Visit

## 2015-04-24 DIAGNOSIS — Z1231 Encounter for screening mammogram for malignant neoplasm of breast: Secondary | ICD-10-CM

## 2015-04-24 NOTE — Addendum Note (Signed)
Addended by: Neomia DearPOWERS, Nike Southers E on: 04/24/2015 07:18 PM   Modules accepted: Orders

## 2015-05-30 ENCOUNTER — Ambulatory Visit (INDEPENDENT_AMBULATORY_CARE_PROVIDER_SITE_OTHER): Payer: Self-pay | Admitting: Internal Medicine

## 2015-05-30 ENCOUNTER — Encounter: Payer: Self-pay | Admitting: Internal Medicine

## 2015-05-30 VITALS — BP 159/95 | HR 80 | Temp 97.6°F | Wt 167.9 lb

## 2015-05-30 DIAGNOSIS — S76012D Strain of muscle, fascia and tendon of left hip, subsequent encounter: Secondary | ICD-10-CM

## 2015-05-30 DIAGNOSIS — X58XXXD Exposure to other specified factors, subsequent encounter: Secondary | ICD-10-CM

## 2015-05-30 DIAGNOSIS — S76012A Strain of muscle, fascia and tendon of left hip, initial encounter: Secondary | ICD-10-CM | POA: Insufficient documentation

## 2015-05-30 DIAGNOSIS — I10 Essential (primary) hypertension: Secondary | ICD-10-CM

## 2015-05-30 DIAGNOSIS — S76219D Strain of adductor muscle, fascia and tendon of unspecified thigh, subsequent encounter: Secondary | ICD-10-CM

## 2015-05-30 MED ORDER — MELOXICAM 15 MG PO TABS: 15.0000 mg | ORAL_TABLET | Freq: Every day | ORAL | Status: DC

## 2015-05-30 MED ORDER — ACETAMINOPHEN 500 MG PO TABS: 1000.0000 mg | ORAL_TABLET | Freq: Three times a day (TID) | ORAL | Status: DC | PRN

## 2015-05-30 MED ORDER — HYDROCHLOROTHIAZIDE 12.5 MG PO CAPS: 12.5000 mg | ORAL_CAPSULE | Freq: Every day | ORAL | Status: DC

## 2015-05-30 MED ORDER — LISINOPRIL 40 MG PO TABS: 40.0000 mg | ORAL_TABLET | Freq: Every day | ORAL | Status: DC

## 2015-05-30 NOTE — Assessment & Plan Note (Signed)
Her history of straining her adductor muscles last summer along with tenderness to palpation over these muscle groups and pain on hip adduction makes me think this is most likely an adductor muscle strain. I don't think this is a hip fracture nor avascular necrosis because she has a normal range of motion within the hip joint. She does not have an inguinal bulge suggestive of hernia.  For her muscle strain, we'll treat with aggressive NSAIDs and rest for 2 weeks. I've prescribed acetaminophen 1g every 8 hours and meloxicam 15mg  daily for 2 weeks. If she doesn't improve after 2 weeks, we can offer referral to physical therapy.

## 2015-05-30 NOTE — Assessment & Plan Note (Signed)
She continues to be hypertensive today to 159/95 so I've increased her lisinopril from 20 to 40mg  daily. She'll follow-up with Dr. Mikey BussingHoffman in mid-June for further titration.

## 2015-05-30 NOTE — Progress Notes (Signed)
Patient ID: [name], female   DOB: Mar 11, 1965, 50 y.o.   MRN: [id number] [facility] Subjective:   Patient ID: [name] female   DOB: 09-Dec-1965 50 y.o.   MRN: [id number]  HPI: [name] is a very friendly 50 y.o. female with a relevant history of depression and type 2 diabetes here for evaluation of right groin pain and hypertension.  Right groin pain: Last summer, she slipped while coming out of the shower and pulled her right inner groin muscle. Since then, she continues to have pain when moving her right hip inward or outward. The pain is mostly relieved by topical icy hot patches. She has been walking about 30 minutes a day to try to loosen up the muscles.  Hypertension: She has been taking her lisinopril 20 mg daily and hydrochlorothiazide 12.5 mg daily as prescribed. She has not been checking her blood pressure home. She denies any orthostatic hypotension or cough.  She is not smoking and I have reviewed her medications today.  Review of Systems  Constitutional: Negative for fever, chills and malaise/fatigue.  Genitourinary: Negative for dysuria, urgency and frequency.  Musculoskeletal: Positive for myalgias. Negative for back pain, joint pain and falls.  Neurological: Negative for weakness.   Objective:  Physical Exam: Filed Vitals:   05/30/15 1447 05/30/15 1533  BP: 163/94 159/95  Pulse: 87 80  Temp: 97.6 F (36.4 C)   TempSrc: Oral   Weight: 167 lb 14.4 oz (76.159 kg)   SpO2: 100%    General: resting in bed comfortably, appropriately conversational HEENT: no scleral icterus, extra-ocular muscles intact, oropharynx without lesions Cardiac: regular rate and rhythm, no rubs, murmurs or gallops Pulm: breathing well, clear to auscultation bilaterally Abd: bowel sounds normal, soft, nondistended, non-tender, no inguinal masses MSK: left hip adductor muscles tender to palpation, left hip adduction strength 4/5 with  pain on active motion, strength 5/5 in all lower extremity joints, sensation intact to light touch on bilateral lower extremities  Assessment & Plan:  Case discussed with Dr. Josem Kaufmann  Strain of left hip adductor muscle Her history of straining her adductor muscles last summer along with tenderness to palpation over these muscle groups and pain on hip adduction makes me think this is most likely an adductor muscle strain. I don't think this is a hip fracture nor avascular necrosis because she has a normal range of motion within the hip joint. She does not have an inguinal bulge suggestive of hernia.  For her muscle strain, we'll treat with aggressive NSAIDs and rest for 2 weeks. I've prescribed acetaminophen 1g every 8 hours and meloxicam  daily for 2 weeks. If she doesn't improve after 2 weeks, we can offer referral to physical therapy.  Hypertension She continues to be hypertensive today to 159/95 so I've increased her lisinopril from 20 to  daily. She'll follow-up with Dr. Mikey Bussing in mid-June for further titration.   Medications Ordered Meds ordered this encounter  Medications  . hydrochlorothiazide (MICROZIDE) 12.5 MG capsule    Sig: Take 1 capsule (12.5 mg total) by mouth daily.    Dispense:  90 capsule    Refill:  1  . lisinopril (PRINIVIL,ZESTRIL) 40 MG tablet    Sig: Take 1 tablet (40 mg total) by mouth daily.    Dispense:  90 tablet    Refill:  3  . meloxicam (MOBIC) 15 MG tablet    Sig: Take 1 tablet (15 mg total) by mouth daily.  Dispense:  14 tablet    Refill:  0  . acetaminophen (TYLENOL) 500 MG tablet    Sig: Take 2 tablets (1,000 mg total) by mouth every 8 (eight) hours as needed.    Dispense:  100 tablet    Refill:  2   Follow Up: Return in about 4 weeks (around 06/27/2015) for follow up hypertension and diabetes with primary doctor.

## 2015-06-01 NOTE — Progress Notes (Signed)
Case discussed with Dr. Flores at the time of the visit. We reviewed the resident's history and exam and pertinent patient test results. I agree with the assessment, diagnosis, and plan of care documented in the resident's note. 

## 2015-07-07 ENCOUNTER — Encounter: Payer: Self-pay | Admitting: Internal Medicine

## 2015-07-07 ENCOUNTER — Ambulatory Visit: Payer: Self-pay | Admitting: Internal Medicine

## 2015-07-11 ENCOUNTER — Encounter: Payer: Self-pay | Admitting: *Deleted

## 2015-07-11 ENCOUNTER — Other Ambulatory Visit: Payer: Self-pay | Admitting: Internal Medicine

## 2015-07-11 DIAGNOSIS — N181 Chronic kidney disease, stage 1: Principal | ICD-10-CM

## 2015-07-11 DIAGNOSIS — E1122 Type 2 diabetes mellitus with diabetic chronic kidney disease: Secondary | ICD-10-CM

## 2015-07-11 DIAGNOSIS — E1165 Type 2 diabetes mellitus with hyperglycemia: Principal | ICD-10-CM

## 2015-07-11 NOTE — Telephone Encounter (Signed)
Asking for refill for metformin [facility] on [address]

## 2015-07-13 ENCOUNTER — Other Ambulatory Visit: Payer: Self-pay | Admitting: Internal Medicine

## 2015-07-17 ENCOUNTER — Ambulatory Visit: Payer: Self-pay | Admitting: Internal Medicine

## 2015-07-17 ENCOUNTER — Encounter: Payer: Self-pay | Admitting: Internal Medicine

## 2015-08-16 ENCOUNTER — Encounter (HOSPITAL_COMMUNITY): Payer: Self-pay | Admitting: Emergency Medicine

## 2015-08-16 ENCOUNTER — Emergency Department (HOSPITAL_COMMUNITY)
Admission: EM | Admit: 2015-08-16 | Discharge: 2015-08-16 | Disposition: A | Discharge status: Home / Self Care | Payer: Self-pay | Attending: Emergency Medicine | Admitting: Emergency Medicine

## 2015-08-16 DIAGNOSIS — E871 Hypo-osmolality and hyponatremia: Secondary | ICD-10-CM | POA: Insufficient documentation

## 2015-08-16 DIAGNOSIS — Z7984 Long term (current) use of oral hypoglycemic drugs: Secondary | ICD-10-CM | POA: Insufficient documentation

## 2015-08-16 DIAGNOSIS — Z87891 Personal history of nicotine dependence: Secondary | ICD-10-CM | POA: Insufficient documentation

## 2015-08-16 DIAGNOSIS — Z79899 Other long term (current) drug therapy: Secondary | ICD-10-CM | POA: Insufficient documentation

## 2015-08-16 DIAGNOSIS — I10 Essential (primary) hypertension: Secondary | ICD-10-CM | POA: Insufficient documentation

## 2015-08-16 DIAGNOSIS — E1140 Type 2 diabetes mellitus with diabetic neuropathy, unspecified: Secondary | ICD-10-CM | POA: Insufficient documentation

## 2015-08-16 LAB — CBC
HEMATOCRIT: 31.2 % — AB (ref 36.0–46.0)
HEMOGLOBIN: 11.1 g/dL — AB (ref 12.0–15.0)
MCH: 28.5 pg (ref 26.0–34.0)
MCHC: 35.6 g/dL (ref 30.0–36.0)
MCV: 80 fL (ref 78.0–100.0)
Platelets: 282 10*3/uL (ref 150–400)
RBC: 3.9 MIL/uL (ref 3.87–5.11)
RDW: 13.9 % (ref 11.5–15.5)
WBC: 8.9 10*3/uL (ref 4.0–10.5)

## 2015-08-16 LAB — COMPREHENSIVE METABOLIC PANEL
ALT: 40 U/L (ref 14–54)
ANION GAP: 13 (ref 5–15)
AST: 31 U/L (ref 15–41)
Albumin: 4.2 g/dL (ref 3.5–5.0)
Alkaline Phosphatase: 117 U/L (ref 38–126)
BUN: <5 mg/dL — ABNORMAL LOW (ref 6–20)
CHLORIDE: 83 mmol/L — AB (ref 101–111)
CO2: 22 mmol/L (ref 22–32)
Calcium: 9.4 mg/dL (ref 8.9–10.3)
Creatinine, Ser: 0.48 mg/dL (ref 0.44–1.00)
GFR calc Af Amer: >60 mL/min (ref >60)
GFR calc non Af Amer: >60 mL/min (ref >60)
GLUCOSE: 153 mg/dL — AB (ref 65–99)
POTASSIUM: 3.7 mmol/L (ref 3.5–5.1)
SODIUM: 118 mmol/L — AB (ref 135–145)
Total Bilirubin: 0.4 mg/dL (ref 0.3–1.2)
Total Protein: 7.5 g/dL (ref 6.5–8.1)

## 2015-08-16 LAB — URINALYSIS, ROUTINE W REFLEX MICROSCOPIC
BILIRUBIN URINE: NEGATIVE
Glucose, UA: NEGATIVE mg/dL
HGB URINE DIPSTICK: NEGATIVE
Ketones, ur: NEGATIVE mg/dL
Leukocytes, UA: NEGATIVE
Nitrite: NEGATIVE
PH: 5 (ref 5.0–8.0)
Protein, ur: NEGATIVE mg/dL
Specific Gravity, Urine: 1.011 (ref 1.005–1.030)

## 2015-08-16 LAB — CBG MONITORING, ED: Glucose-Capillary: 146 mg/dL — ABNORMAL HIGH (ref 65–99)

## 2015-08-16 LAB — LIPASE, BLOOD: LIPASE: 28 U/L (ref 11–51)

## 2015-08-16 MED ORDER — SODIUM CHLORIDE 0.9 % IV BOLUS (SEPSIS)
1000.0000 mL | Freq: Once | INTRAVENOUS | Status: AC
  Administered 2015-08-16: 1000 mL via INTRAVENOUS

## 2015-08-16 NOTE — ED Provider Notes (Signed)
[facility]-EMERGENCY DEPT Provider Note   CSN: [id number] Arrival date & time: 08/16/15  4098  First Provider Contact:  First MD Initiated Contact with Patient 08/16/15 2045        History   Chief Complaint Chief Complaint  Patient presents with  . Emesis  . Foot Swelling    HPI [name] is a 50 y.o. female.  50 yo F with a chief complaint of nausea and vomiting. This happened a couple times yesterday. Patient's all her family doctor today for the same. While there she was noted to have a sodium of 118 and was told to come to the emergency department. The patient denies any current symptoms. Had some lower extremity edema is been off and on. She denies fevers or chills denies vomiting or diarrhea. She denies any recent medication changes.   The history is provided by the patient.  Emesis   This is a new problem. The current episode started yesterday. The problem occurs 2 to 4 times per day. The problem has been resolved. The emesis has an appearance of stomach contents. There has been no fever. Pertinent negatives include no arthralgias, no chills, no fever, no headaches and no myalgias. Risk factors: none.    Past Medical History:  Diagnosis Date  . Anemia    when in late teens  . Anxiety   . Breast abscess   . Chlamydia   . Depression    PTSD also  . Diabetes mellitus without complication (HCC)   . Gonorrhea   . High cholesterol   . Hypercholesteremia   . Hypertension   . Recurrent upper respiratory infection (URI)    URI/SOB-treated with antibiotics-cleared up now  . Seizures (HCC)   . Substance abuse    alcohol  . Tuberculosis    was treated    Patient Active Problem List   Diagnosis Date Noted  . Strain of left hip adductor muscle 05/30/2015  . Tobacco use disorder 04/12/2015  . Abnormal brain MRI 03/10/2015  . Elevated liver enzymes 04/14/2013  . Diabetic neuropathy, painful (HCC) 04/14/2013  . Alcohol dependence (HCC) 03/23/2013  . Substance  induced mood disorder (HCC) 03/22/2013  . Depression 02/05/2012  . Hyperlipidemia 02/05/2012  . Hypertension 01/07/2012  . 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 BREAST LUMPECTOMY    . INCISE AND DRAIN ABCESS  01/03/11   breast abscess  . TONGUE SURGERY  2008   lump removed    OB History    Gravida Para Term Preterm AB Living   SAB TAB Ectopic Multiple Live Births                   Home Medications    Prior to Admission medications   Medication Sig Start Date End Date Taking? Authorizing Provider  acetaminophen (TYLENOL) 500 MG tablet Take 2 tablets (1,000 mg total) by mouth every 8 (eight) hours as needed. 05/30/15 05/29/16 Yes Selina Cooley, MD  albuterol (PROVENTIL HFA;VENTOLIN HFA) 108 (90 Base) MCG/ACT inhaler Inhale 2 puffs into the lungs every 6 (six) hours as needed for wheezing or shortness of breath. 04/12/15  Yes Jana Half, MD  hydrochlorothiazide (MICROZIDE) 12.5 MG capsule Take 1 capsule (12.5 mg total) by mouth daily. 05/30/15  Yes Selina Cooley, MD  lisinopril (PRINIVIL,ZESTRIL) 40 MG tablet Take 1 tablet (40 mg total) by mouth  daily. 05/30/15  Yes Selina Cooley, MD  metFORMIN (GLUCOPHAGE) 1000 MG tablet TAKE ONE TABLET BY MOUTH TWICE DAILY WITH  MEAL 07/13/15  Yes Inez Catalina, MD  pravastatin (PRAVACHOL) 40 MG tablet Take 1 tablet (40 mg total) by mouth daily. 06/16/14  Yes Gust Rung, DO  meclizine (ANTIVERT) 25 MG tablet Take 1 tablet (25 mg total) by mouth 3 (three) times daily as needed for dizziness. 03/13/15   Gust Rung, DO  meloxicam (MOBIC) 15 MG tablet Take 1 tablet (15 mg total) by mouth daily. 05/30/15   Selina Cooley, MD    Family History Family History  Problem Relation Age of Onset  . Breast cancer Mother   . Breast cancer Sister   . Breast cancer Maternal Aunt     Social History Social History  Substance Use Topics  .  Smoking status: Former Smoker    Packs/day: 0.00    Types: Cigarettes    Quit date: 12/11/2012  . Smokeless tobacco: Never Used  . Alcohol use Yes     Comment: Beer.     Allergies   Review of patient's allergies indicates no known allergies.   Review of Systems Review of Systems  Constitutional: Negative for chills and fever.  HENT: Negative for congestion and rhinorrhea.   Eyes: Negative for redness and visual disturbance.  Respiratory: Negative for shortness of breath and wheezing.   Cardiovascular: Positive for leg swelling. Negative for chest pain and palpitations.  Gastrointestinal: Positive for nausea and vomiting.  Genitourinary: Negative for dysuria and urgency.  Musculoskeletal: Negative for arthralgias and myalgias.  Skin: Negative for pallor and wound.  Neurological: Negative for dizziness and headaches.     Physical Exam Updated Vital Signs BP 143/73   Pulse 88   Temp 98.2 F (36.8 C) (Oral)   Resp 18   Wt 164 lb 8 oz (74.6 kg)   SpO2 98%   BMI 28.24 kg/m   Physical Exam  Constitutional: She is oriented to person, place, and time. She appears well-developed and well-nourished. No distress.  HENT:  Head: Normocephalic and atraumatic.  Eyes: EOM are normal. Pupils are equal, round, and reactive to light.  Neck: Normal range of motion. Neck supple.  Cardiovascular: Normal rate and regular rhythm.  Exam reveals no gallop and no friction rub.   No murmur heard. Pulmonary/Chest: Effort normal. She has no wheezes. She has no rales.  Abdominal: Soft. She exhibits no distension. There is no tenderness.  Musculoskeletal: She exhibits no edema or tenderness.  Neurological: She is alert and oriented to person, place, and time.  Skin: Skin is warm and dry. She is not diaphoretic.  Psychiatric: She has a normal mood and affect. Her behavior is normal.  Nursing note and vitals reviewed.    ED Treatments / Results  Labs (all labs ordered are listed, but only  abnormal results are displayed) Labs Reviewed  COMPREHENSIVE METABOLIC PANEL - Abnormal; Notable for the following:       Result Value   Sodium 118 (*)    Chloride 83 (*)    Glucose, Bld 153 (*)    BUN <5 (*)    All other components within normal limits  CBC - Abnormal; Notable for the following:    Hemoglobin 11.1 (*)    HCT 31.2 (*)    All other components within normal limits  CBG MONITORING, ED - Abnormal; Notable for the following:    Glucose-Capillary 146 (*)    All other components  within normal limits  LIPASE, BLOOD  URINALYSIS, ROUTINE W REFLEX MICROSCOPIC (NOT AT [facility])    EKG  EKG Interpretation None       Radiology No results found.  Procedures Procedures (including critical care time)  Medications Ordered in ED Medications  sodium chloride 0.9 % bolus 1,000 mL (0 mLs Intravenous Stopped 08/16/15 2142)     Initial Impression / Assessment and Plan / ED Course  I have reviewed the triage vital signs and the nursing notes.  Pertinent labs & imaging results that were available during my care of the patient were reviewed by me and considered in my medical decision making (see chart for details).  Clinical Course    50 yo F With a chief complaint of hyponatremia. The patient currently is asymptomatic. She is requesting discharge home. Discussed the risks and benefits of admission versus outpatient management. The patient is currently electing to stay out of the hospital. She was given a liter of IV fluids prior to discharge. She will call her family physician in the morning.  11:01 PM:  I have discussed the diagnosis/risks/treatment options with the patient and family and believe the pt to be eligible for discharge home to follow-up with PCP. We also discussed returning to the ED immediately if new or worsening sx occur. We discussed the sx which are most concerning (e.g., sudden worsening pain, fever, inability to tolerate by mouth) that necessitate immediate  return. Medications administered to the patient during their visit and any new prescriptions provided to the patient are listed below.  Medications given during this visit Medications  sodium chloride 0.9 % bolus 1,000 mL (0 mLs Intravenous Stopped 08/16/15 2142)    Discharge Medication List as of 08/16/2015  9:59 PM      The patient appears reasonably screen and/or stabilized for discharge and I doubt any other medical condition or other [facility] requiring further screening, evaluation, or treatment in the ED at this time prior to discharge.    Final Clinical Impressions(s) / ED Diagnoses   Final diagnoses:  Hyponatremia    New Prescriptions Discharge Medication List as of 08/16/2015  9:59 PM       Melene Plan, DO 08/16/15 2301

## 2015-08-16 NOTE — ED Notes (Signed)
Critical Na 118 called to MD [place].

## 2015-08-16 NOTE — ED Notes (Signed)
Pt and family understood dc material. NAD Noted 

## 2015-08-16 NOTE — ED Triage Notes (Signed)
Patient reports bilateral feet swelling , emesis x2 today with mild dizziness /fatigue . Denies fever or chills. Drank 3 cans of beer today .

## 2015-08-23 ENCOUNTER — Other Ambulatory Visit: Payer: Self-pay | Admitting: *Deleted

## 2015-08-23 DIAGNOSIS — E785 Hyperlipidemia, unspecified: Secondary | ICD-10-CM

## 2015-08-23 MED ORDER — PRAVASTATIN SODIUM 40 MG PO TABS: 40.0000 mg | ORAL_TABLET | Freq: Every day | ORAL | 3 refills | Status: DC

## 2015-08-28 ENCOUNTER — Encounter (HOSPITAL_COMMUNITY): Payer: Self-pay | Admitting: Vascular Surgery

## 2015-08-28 ENCOUNTER — Telehealth: Payer: Self-pay

## 2015-08-28 ENCOUNTER — Emergency Department (HOSPITAL_COMMUNITY): Payer: Self-pay

## 2015-08-28 ENCOUNTER — Emergency Department (HOSPITAL_COMMUNITY)
Admission: EM | Admit: 2015-08-28 | Discharge: 2015-08-29 | Disposition: A | Discharge status: Home / Self Care | Payer: Self-pay | Attending: Emergency Medicine | Admitting: Emergency Medicine

## 2015-08-28 DIAGNOSIS — R0789 Other chest pain: Secondary | ICD-10-CM | POA: Insufficient documentation

## 2015-08-28 DIAGNOSIS — E119 Type 2 diabetes mellitus without complications: Secondary | ICD-10-CM | POA: Insufficient documentation

## 2015-08-28 DIAGNOSIS — Z7984 Long term (current) use of oral hypoglycemic drugs: Secondary | ICD-10-CM | POA: Insufficient documentation

## 2015-08-28 DIAGNOSIS — J069 Acute upper respiratory infection, unspecified: Secondary | ICD-10-CM | POA: Insufficient documentation

## 2015-08-28 DIAGNOSIS — I10 Essential (primary) hypertension: Secondary | ICD-10-CM | POA: Insufficient documentation

## 2015-08-28 DIAGNOSIS — Z87891 Personal history of nicotine dependence: Secondary | ICD-10-CM | POA: Insufficient documentation

## 2015-08-28 LAB — CBC
HEMATOCRIT: 36.8 % (ref 36.0–46.0)
HEMOGLOBIN: 12.7 g/dL (ref 12.0–15.0)
MCH: 28.4 pg (ref 26.0–34.0)
MCHC: 34.5 g/dL (ref 30.0–36.0)
MCV: 82.3 fL (ref 78.0–100.0)
PLATELETS: 410 10*3/uL — AB (ref 150–400)
RBC: 4.47 MIL/uL (ref 3.87–5.11)
RDW: 13.6 % (ref 11.5–15.5)
WBC: 9.2 10*3/uL (ref 4.0–10.5)

## 2015-08-28 LAB — BASIC METABOLIC PANEL
ANION GAP: 12 (ref 5–15)
BUN: 10 mg/dL (ref 6–20)
CHLORIDE: 89 mmol/L — AB (ref 101–111)
CO2: 25 mmol/L (ref 22–32)
CREATININE: 0.61 mg/dL (ref 0.44–1.00)
Calcium: 9.9 mg/dL (ref 8.9–10.3)
GFR calc non Af Amer: >60 mL/min (ref >60)
Glucose, Bld: 160 mg/dL — ABNORMAL HIGH (ref 65–99)
POTASSIUM: 3.9 mmol/L (ref 3.5–5.1)
SODIUM: 126 mmol/L — AB (ref 135–145)

## 2015-08-28 LAB — I-STAT TROPONIN, ED: Troponin i, poc: 0 ng/mL (ref 0.00–0.08)

## 2015-08-28 NOTE — Telephone Encounter (Signed)
Needs to speak with a nurse regarding bp and blood sugar.

## 2015-08-28 NOTE — ED Notes (Signed)
Pt states that she only has pain when she coughs.

## 2015-08-28 NOTE — ED Provider Notes (Signed)
TIME SEEN:  By signing my name below, I, Arianna Nassar, attest that this documentation has been prepared under the direction and in the presence of Enbridge EnergyKristen N Shenna Brissette, DO.  Electronically Signed: Octavia HeirArianna Nassar, ED Scribe. 08/28/15. 11:30 PM.   CHIEF COMPLAINT:  Chief Complaint  Patient presents with  . Chest Pain     HPI:  HPI Comments: [name] is a 50 y.o. female who has h/o DM, hypercholesteremia, HTN, etoh abuse and seizures presents to the Emergency Department complaining of sudden onset, gradual worsening, moderate mid-sternal chest pain secondary to cough onset today. Pt notes her chest pain as squeezing and would last about 2-3 seconds before alleviating. She states she only has chest pain when she coughs which she notes as a dry "hacking" cough. Pt says she had viral symptoms last week before her cough started. She reports taking half of an aspirin to alleviate her pain with relief. She has not been around any sick contacts. Pt denies fever, shortness of breath, n/v, dizziness, diaphoresis.  No history of PE, DVT, exogenous estrogen use, fracture, surgery, trauma, hospitalization, prolonged travel. No lower extremity swelling or pain. No calf tenderness.  Pt has an appointment with her PCP tomorrow morning.    ROS: See HPI Constitutional: no fever  Eyes: no drainage  ENT: no runny nose   Cardiovascular: chest pain  Resp: no SOB  GI: no vomiting GU: no dysuria Integumentary: no rash  Allergy: no hives  Musculoskeletal: no leg swelling  Neurological: no slurred speech ROS otherwise negative  PAST MEDICAL HISTORY/PAST SURGICAL HISTORY:  Past Medical History:  Diagnosis Date  . Anemia    when in late teens  . Anxiety   . Breast abscess   . Chlamydia   . Depression    PTSD also  . Diabetes mellitus without complication (HCC)   . Gonorrhea   . High cholesterol   . Hypercholesteremia   . Hypertension   . Recurrent upper respiratory infection (URI)     URI/SOB-treated with antibiotics-cleared up now  . Seizures (HCC)   . Substance abuse    alcohol  . Tuberculosis    was treated    MEDICATIONS:  Prior to Admission medications   Medication Sig Start Date End Date Taking? Authorizing Provider  albuterol (PROVENTIL HFA;VENTOLIN HFA) 108 (90 Base) MCG/ACT inhaler Inhale 2 puffs into the lungs every 6 (six) hours as needed for wheezing or shortness of breath. 04/12/15  Yes Jana HalfNicholas A Taylor, MD  diphenhydramine-acetaminophen (TYLENOL PM EXTRA STRENGTH) 25-500 MG TABS tablet Take 2 tablets by mouth at bedtime as needed.   Yes Historical Provider, MD  hydrochlorothiazide (MICROZIDE) 12.5 MG capsule Take 1 capsule (12.5 mg total) by mouth daily. 05/30/15  Yes Selina CooleyKyle Flores, MD  lisinopril (PRINIVIL,ZESTRIL) 40 MG tablet Take 1 tablet (40 mg total) by mouth daily. 05/30/15  Yes Selina CooleyKyle Flores, MD  metFORMIN (GLUCOPHAGE) 1000 MG tablet TAKE ONE TABLET BY MOUTH TWICE DAILY WITH  MEAL 07/13/15  Yes Inez CatalinaEmily B Mullen, MD  naproxen sodium (ALEVE) 220 MG tablet Take 220-440 mg by mouth daily as needed (FOR LEG PAIN).   Yes Historical Provider, MD  pravastatin (PRAVACHOL) 40 MG tablet Take 1 tablet (40 mg total) by mouth daily. 08/23/15  Yes Doneen PoissonLawrence Klima, MD  trolamine salicylate (ASPERCREME) 10 % cream Apply 1 application topically daily as needed for muscle pain (and leg pain).    Yes Historical Provider, MD  acetaminophen (TYLENOL) 500 MG tablet Take 2 tablets (1,000 mg total) by  mouth every 8 (eight) hours as needed. Patient not taking: Reported on 08/28/2015 05/30/15 05/29/16  Selina Cooley, MD  meclizine (ANTIVERT) 25 MG tablet Take 1 tablet (25 mg total) by mouth 3 (three) times daily as needed for dizziness. Patient not taking: Reported on 08/28/2015 03/13/15   Gust Rung, DO  meloxicam (MOBIC) 15 MG tablet Take 1 tablet (15 mg total) by mouth daily. Patient not taking: Reported on 08/28/2015 05/30/15   Selina Cooley, MD    ALLERGIES:  No Known Allergies  SOCIAL  HISTORY:  Social History  Substance Use Topics  . Smoking status: Former Smoker    Packs/day: 0.00    Types: Cigarettes    Quit date: 12/11/2012  . Smokeless tobacco: Never Used  . Alcohol use Yes     Comment: Beer.    FAMILY HISTORY: Family History  Problem Relation Age of Onset  . Breast cancer Mother   . Breast cancer Sister   . Breast cancer Maternal Aunt     EXAM: BP 143/98   Pulse 87   Temp 98.2 F (36.8 C) (Oral)   Resp 15   SpO2 96%  CONSTITUTIONAL: Alert and oriented and responds appropriately to questions. Well-appearing; well-nourished HEAD: Normocephalic EYES: Conjunctivae clear, PERRL ENT: normal nose; no rhinorrhea; moist mucous membranes NECK: Supple, no meningismus, no LAD  CARD: RRR; S1 and S2 appreciated; no murmurs, no clicks, no rubs, no gallops CHEST:  Nontender to palpation, no crepitus, ecchymosis, deformity RESP: Normal chest excursion without splinting or tachypnea; breath sounds clear and equal bilaterally; no wheezes, no rhonchi, no rales, no hypoxia or respiratory distress, speaking full sentences ABD/GI: Normal bowel sounds; non-distended; soft, non-tender, no rebound, no guarding, no peritoneal signs BACK:  The back appears normal and is non-tender to palpation, there is no CVA tenderness EXT: Normal ROM in all joints; non-tender to palpation; no edema; normal capillary refill; no cyanosis, no calf tenderness or swelling    SKIN: Normal color for age and race; warm; no rash NEURO: Moves all extremities equally, sensation to light touch intact diffusely, cranial nerves II through XII intact PSYCH: The patient's mood and manner are appropriate. Grooming and personal hygiene are appropriate.  MEDICAL DECISION MAKING: Patient here with atypical chest pain. Describes it as pain that would last for 2-3 seconds with no other associated factors. Pain worse with coughing only. No chest pain currently. First troponin negative. Does have risk factors for  ACS so will repeat second troponin. No risk factors for pulmonary embolus. Lungs are clear to auscultation. Suspect viral illness. X-ray shows no infiltrate. She has no fever. I do not feel she needs antibiotics. Offered anti-inflammatories for pain and she declined stating pain is really only present when she coughs and coughing is very intermittent. Suspect that this is musculoskeletal, chest wall pain.  ED PROGRESS: 12:45 AM  Patient still hemodynamically stable. Slightly hypertensive but is on medication. Will follow up with her PCP in the morning. Second troponin negative. Still chest pain-free. Will discharge home. Patient comfortable with this plan.   At this time, I do not feel there is any life-threatening condition present. I have reviewed and discussed all results (EKG, imaging, lab, urine as appropriate), exam findings with patient/family. I have reviewed nursing notes and appropriate previous records.  I feel the patient is safe to be discharged home without further emergent workup and can continue workup as an outpatient. Discussed usual and customary return precautions. Patient/family verbalize understanding and are comfortable with this plan.  Outpatient follow-up has been provided. All questions have been answered.    EKG Interpretation  Date/Time:  Monday August 28 2015 18:50:52 EDT Ventricular Rate:  103 PR Interval:  162 QRS Duration: 90 QT Interval:  366 QTC Calculation: 479 R Axis:   14 Text Interpretation:  Sinus tachycardia Cannot rule out Anterior infarct , age undetermined Abnormal ECG No significant change since last tracing other than rate is faster Confirmed by Christina Waldrop,  DO, Delbert Darley [phone]) on 08/28/2015 11:25:03 PM        I personally performed the services described in this documentation, which was scribed in my presence. The recorded information has been reviewed and is accurate.    Layla Maw Dion Parrow, DO 08/29/15 [phone]

## 2015-08-28 NOTE — ED Triage Notes (Signed)
Pt reports to the ED for eval of sharp midsternal CP that started today. She was watching TV when the pain began. Pt reports is worse when she coughs. She has been having a cough that began today as well. Pt denies any associated symptoms. Pt A&OX4, resp e/u, and skin warm and dry.

## 2015-08-29 ENCOUNTER — Ambulatory Visit (INDEPENDENT_AMBULATORY_CARE_PROVIDER_SITE_OTHER): Payer: Self-pay | Admitting: Internal Medicine

## 2015-08-29 VITALS — BP 138/93 | HR 105 | Temp 98.5°F | Ht 64.0 in | Wt 158.5 lb

## 2015-08-29 DIAGNOSIS — Z7984 Long term (current) use of oral hypoglycemic drugs: Secondary | ICD-10-CM

## 2015-08-29 DIAGNOSIS — E871 Hypo-osmolality and hyponatremia: Secondary | ICD-10-CM

## 2015-08-29 DIAGNOSIS — E1129 Type 2 diabetes mellitus with other diabetic kidney complication: Secondary | ICD-10-CM

## 2015-08-29 DIAGNOSIS — E1140 Type 2 diabetes mellitus with diabetic neuropathy, unspecified: Secondary | ICD-10-CM

## 2015-08-29 DIAGNOSIS — Z79899 Other long term (current) drug therapy: Secondary | ICD-10-CM

## 2015-08-29 DIAGNOSIS — J069 Acute upper respiratory infection, unspecified: Secondary | ICD-10-CM | POA: Insufficient documentation

## 2015-08-29 DIAGNOSIS — I10 Essential (primary) hypertension: Secondary | ICD-10-CM

## 2015-08-29 DIAGNOSIS — IMO0001 Reserved for inherently not codable concepts without codable children: Secondary | ICD-10-CM

## 2015-08-29 DIAGNOSIS — F17210 Nicotine dependence, cigarettes, uncomplicated: Secondary | ICD-10-CM

## 2015-08-29 DIAGNOSIS — R809 Proteinuria, unspecified: Secondary | ICD-10-CM

## 2015-08-29 DIAGNOSIS — E785 Hyperlipidemia, unspecified: Secondary | ICD-10-CM

## 2015-08-29 LAB — GLUCOSE, CAPILLARY: GLUCOSE-CAPILLARY: 110 mg/dL — AB (ref 65–99)

## 2015-08-29 LAB — I-STAT TROPONIN, ED: TROPONIN I, POC: 0.01 ng/mL (ref 0.00–0.08)

## 2015-08-29 LAB — POCT GLYCOSYLATED HEMOGLOBIN (HGB A1C): HEMOGLOBIN A1C: 5.6

## 2015-08-29 MED ORDER — ATORVASTATIN CALCIUM 10 MG PO TABS: 10.0000 mg | ORAL_TABLET | Freq: Every day | ORAL | 3 refills | Status: DC

## 2015-08-29 MED ORDER — AMLODIPINE BESYLATE 10 MG PO TABS: 10.0000 mg | ORAL_TABLET | Freq: Every day | ORAL | 3 refills | Status: DC

## 2015-08-29 MED ORDER — GABAPENTIN 100 MG PO CAPS: 100.0000 mg | ORAL_CAPSULE | Freq: Three times a day (TID) | ORAL | 3 refills | Status: DC

## 2015-08-29 MED ORDER — GUAIFENESIN ER 600 MG PO TB12: 600.0000 mg | ORAL_TABLET | Freq: Two times a day (BID) | ORAL | 0 refills | Status: DC

## 2015-08-29 MED ORDER — GUAIFENESIN-DM 100-10 MG/5ML PO SYRP: 5.0000 mL | ORAL_SOLUTION | ORAL | 0 refills | Status: DC | PRN

## 2015-08-29 MED FILL — MUCINEX ER 600 MG TABLET: 600 | 7 days supply | Qty: 14 | Fill #0

## 2015-08-29 MED FILL — GABAPENTIN 100 MG CAPSULE: 100 | 30 days supply | Qty: 90 | Fill #0

## 2015-08-29 MED FILL — AMLODIPINE BESYLATE 10 MG T: 10 | 30 days supply | Qty: 30 | Fill #0

## 2015-08-29 MED FILL — ATORVASTATIN 10 MG TABLET: 10 | 30 days supply | Qty: 30 | Fill #0

## 2015-08-29 NOTE — Patient Instructions (Addendum)
GO TO THE [facility] FOR YOUR MEDICATIONS: [address] [place], [place] [id number]   FOR YOUR LEG PAIN- TAKE GABAPENTIN 100 MG THREE TIMES PER DAY. IF NOT IMPROVING, WE WILL ORDER TESTING OF YOUR LEGS FOR BLOOD FLOW.  FOR YOUR COUGH- TAKE GUAIFENESIN 600 MG TWICE A DAY AND ROBITUSSIN FOR YOUR COUGH. YOU CAN TAKE NAPROXEN FOR THE PAIN. IF NOT IMPROVING, PLEASE RETURN TO THE CLINIC  FOR YOUR DIABETES- WE WILL CHECK YOUR A1C  FOR YOUR BLOOD PRESSURE- STOP TAKING HYDROCHLOROTHIAZIDE AND START TAKING AMLODIPINE 10 MG ONCE A DAY

## 2015-08-29 NOTE — Discharge Instructions (Signed)
Your labs today were normal including 2 sets of cardiac enzymes. Her EKG showed no new changes. Your chest x-ray showed no pneumonia. This is likely a virus causing her cough and her cough is likely causing your chest pain. Please follow-up with your primary care physician as scheduled in the morning. You may alternate between Tylenol and ibuprofen as needed for fever and pain.

## 2015-08-29 NOTE — Assessment & Plan Note (Signed)
Lab Results  Component Value Date   HGBA1C 5.6 08/29/2015   HGBA1C 8.2 03/10/2015   HGBA1C 10.1 06/16/2014     Assessment: Diabetes control:  Well controlled Progress toward A1C goal:   Improved Comments: Compliant with metformin 1000 mg BID  Plan: Medications:  continue current medications Home glucose monitoring: Frequency:   Timing:   Instruction/counseling given: reminded to bring medications to each visit Educational resources provided:   Self management tools provided:   Other plans: Follow up in 3 months

## 2015-08-29 NOTE — Assessment & Plan Note (Signed)
Patient admits to a non-productive cough and nasal congestion for 2 days. She denies fever, chills, nausea, vomiting, diarrhea or sick contacts. She was seen in the ED and CXR showed no acute changes. Patient has accompanying central chest pain with cough for the past one day. Pain is not worse with inspiration, palpation or exertion. Troponin and EKG in the ED yesterday were unremarkable.   Assessment: Acute URI  Plan: -Guaifenesin 600 mg BID -Robitussin DM -Supportive care -Continue naproxen for pain

## 2015-08-29 NOTE — Assessment & Plan Note (Addendum)
BP Readings from Last 3 Encounters:  08/29/15 (!) 138/93  08/29/15 158/100  08/16/15 143/73    Lab Results  Component Value Date   NA 126 (L) 08/28/2015   K 3.9 08/28/2015   CREATININE 0.61 08/28/2015    Assessment: Blood pressure control:  Controlled  Progress toward BP goal:   At goal Comments: Compliant with lisinopril 40 mg daily and HCTZ 12.5 mg daily.   Plan: Medications:  Due to hyponatremia, will stop HCTZ, continue lisinopril and start amlodipine 10 mg daily

## 2015-08-29 NOTE — Assessment & Plan Note (Addendum)
Patient has a history of chronic asymptomatic hyponatremia as seen per records back to 2012. She has been on HCTZ for this amount of time. Patient clinically appears euvolemic. Doubt hypervolemic hyponatremia or hypovolemic hyponatremia. Patient previously was an alcohol abuser, but states she has one drink every couple of weeks.   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

## 2015-08-29 NOTE — Progress Notes (Signed)
    CC: cough  HPI: [name] is a 4Rexanne Wilkinson y.o. female with PMHx of HTN, T2DM, Depression, HLD, and Diabetic Neuropathy who presents to the clinic for cough and leg pain.   Patient has a history of chronic leg pain secondary to neuropathy. She was taken off of gabapentin several months ago. Patient now presents with worsening over the last 2 weeks that radiates from her feet to her thighs. She does complain of pain in her posterior calves as well that is intermittent. Pain is not always worse with ambulation. It can occur with standing as well. She describes the pain as sharp and throbbing. She will elevate her legs for pain relief. She denies any acute swelling, injury, shortness of breath. She denies history of PAD. She denies weakness. She hasn't tried anything for the pain.   Patient also complains of central chest pain worse with cough for the past one day. Pain is not worse with inspiration, palpation or exertion. Troponin and EKG in the ED yesterday were unremarkable. She admits to a non-productive cough and nasal congestion. She denies fever, chills, nausea, vomiting, diarrhea or sick contacts. CXR showed no acute changes.  Past Medical History:  Diagnosis Date  . Anemia    when in late teens  . Anxiety   . Breast abscess   . Chlamydia   . Depression    PTSD also  . Diabetes mellitus without complication (HCC)   . Gonorrhea   . High cholesterol   . Hypercholesteremia   . Hypertension   . Recurrent upper respiratory infection (URI)    URI/SOB-treated with antibiotics-cleared up now  . Seizures (HCC)   . Substance abuse    alcohol  . Tuberculosis    was treated   Review of Systems: A complete ROS was negative except as noted in HPI.   Physical Exam: Vitals:   08/29/15 0833  BP: (!) 138/93  Pulse: (!) 105  Temp: 98.5 F (36.9 C)  TempSrc: Oral  SpO2: 100%  Weight: 158 lb 8 oz (71.9 kg)  Height: 5\' 4"  (1.626 m)   General: Vital signs reviewed.  Patient is  well-developed and well-nourished, in no acute distress and cooperative with exam.  HEENT: Normocephalic and atraumatic. Conjunctivae normal, no scleral icterus, PERRLA. Neck is supple, trachea midline, no carotid bruit present. Normal posterior oropharynx. Cardiovascular: Regular rate and rhythm, S1 normal, S2 normal, no murmurs, gallops, or rubs. Pulmonary/Chest: Clear to auscultation bilaterally, no wheezes, rales, or rhonchi. Abdominal: Soft, non-tender, non-distended, BS + Extremities: No lower extremity edema bilaterally, pedal pulses symmetric and intact bilaterally. No cyanosis or clubbing. No ulceration. Feet are warm and well perfused with normal sensation. No tenderness on palpation of bilateral calves. Neurological: Strength is normal and symmetric bilaterally, sensory intact to light touch bilaterally.  Skin: Warm, dry and intact. No rashes or erythema.  Assessment & Plan:  See encounters tab for problem based medical decision making. Patient discussed with Dr. Criselda PeachesMullen

## 2015-08-29 NOTE — Assessment & Plan Note (Signed)
Patient has a history of chronic leg pain secondary to neuropathy. She was taken off of gabapentin several months ago. Patient now presents with worsening over the last 2 weeks that radiates from her feet to her thighs. She does complain of pain in her posterior calves as well that is intermittent. Pain is not always worse with ambulation. It can occur with standing as well. She describes the pain as sharp and throbbing. She will elevate her legs for pain relief. She denies any acute swelling, injury, shortness of breath. She denies history of PAD. She denies weakness. She hasn't tried anything for the pain.   Assessment: Diabetic Neuropathy. Low suspicion for PAD at this point. Doubt DVT.  Plan: -Restart gabapentin -If not improving, consider ABIs for PAD

## 2015-08-30 NOTE — Telephone Encounter (Signed)
Patient wanting to know if pravastatin is the same thing as Lipitor as patient has both and did not want to take 2 of the same medication.Told patient that they were similar, but she should not take both. Per last note patient is on Lipitor. Also wanted to know if she should continue her Metformin as her A1C was 5.0. Encouraged patient to continue doing what she is doing right now and to continue the Metformin per visit note.

## 2015-09-01 NOTE — Progress Notes (Signed)
Internal Medicine Clinic Attending  Case discussed with Dr. Burns at the time of the visit.  We reviewed the resident's history and exam and pertinent patient test results.  I agree with the assessment, diagnosis, and plan of care documented in the resident's note.  

## 2015-09-22 ENCOUNTER — Ambulatory Visit (INDEPENDENT_AMBULATORY_CARE_PROVIDER_SITE_OTHER): Payer: Self-pay | Admitting: Internal Medicine

## 2015-09-22 VITALS — BP 126/87 | HR 90 | Temp 98.6°F | Ht 64.0 in | Wt 160.0 lb

## 2015-09-22 DIAGNOSIS — E1140 Type 2 diabetes mellitus with diabetic neuropathy, unspecified: Secondary | ICD-10-CM

## 2015-09-22 DIAGNOSIS — E871 Hypo-osmolality and hyponatremia: Secondary | ICD-10-CM

## 2015-09-22 DIAGNOSIS — I10 Essential (primary) hypertension: Secondary | ICD-10-CM

## 2015-09-22 NOTE — Assessment & Plan Note (Signed)
A: History of chronic asymptomatic hyponatremia as seen per records back to 2012.  HCTZ discontinued at last office visit.  Appears to be euvolemic again on exam today.  P: - repeat BMET now that she is off HCTZ - further work-up may be warranted but will defer to PCP with whom patient has follow up in 3 weeks.

## 2015-09-22 NOTE — Assessment & Plan Note (Signed)
A: Neuropathy pain has resolved with restarting gabapentin and purchasing diabetic shoes.  P: - continue gabapentin - continue wearing diabetic shoes

## 2015-09-22 NOTE — Assessment & Plan Note (Signed)
BP Readings from Last 3 Encounters:  09/22/15 126/87  08/29/15 (!) 138/93  08/29/15 158/100   A: BP doing well today on new regimen of amlodipine 10mg  daily and lisinopril 40mg  daily.  BP at home reported as 110's/70's.  Tolerating amlodipine without side effects.  P: - continue current regimen as above - follow up with PCP - BMET today.

## 2015-09-22 NOTE — Patient Instructions (Signed)
Thank you for coming to see me today. It was a pleasure. Today we talked about:   Blood Pressure:  You are doing a great job with this.  Keep up the good work! I will check some blood work today and let you know if anything abnormal.  Please follow-up with Dr. Vincente LibertyMolt in 2 months.  If you have any questions or concerns, please do not hesitate to call the office at [phone].  Take Care,   Gwynn BurlyAndrew Caryl Manas, DO

## 2015-09-22 NOTE — Progress Notes (Signed)
   CC: here for follow up of HTN and DM neuropathy  HPI:  [name] is a 50 y.o. woman with a past medical history listed below here today for follow up of her HTN and DM neuropathy.  For details of today's visit and the status of her chronic medical issues please refer to the assessment and plan.   Past Medical History:  Diagnosis Date  . Anemia    when in late teens  . Anxiety   . Breast abscess   . Chlamydia   . Depression    PTSD also  . Diabetes mellitus without complication (HCC)   . Gonorrhea   . High cholesterol   . Hypercholesteremia   . Hypertension   . Recurrent upper respiratory infection (URI)    URI/SOB-treated with antibiotics-cleared up now  . Seizures (HCC)   . Substance abuse    alcohol  . Tuberculosis    was treated    Review of Systems:   Review of Systems  Constitutional: Negative for chills and fever.  Cardiovascular: Negative for chest pain and leg swelling.  Neurological:       Negative for leg pain    Physical Exam:  Vitals:   09/22/15 0918  BP: 126/87  Pulse: 90  Temp: 98.6 F (37 C)  TempSrc: Oral  SpO2: 100%  Weight: 160 lb (72.6 kg)  Height: 5\' 4"  (1.626 m)   Physical Exam  Constitutional: She is well-developed, well-nourished, and in no distress.  HENT:  Head: Normocephalic and atraumatic.  Eyes: EOM are normal.  Cardiovascular: Normal rate, regular rhythm and intact distal pulses.   Pulmonary/Chest: Effort normal.  Musculoskeletal: She exhibits no edema.  No calf tenderness.  Skin: Skin is warm and dry.  Psychiatric: Mood and affect normal.    Assessment & Plan:   See Encounters Tab for problem based charting.  Patient discussed with Dr. Criselda PeachesMullen   Hypertension BP Readings from Last 3 Encounters:  09/22/15 126/87  08/29/15 (!) 138/93  08/29/15 158/100   A: BP doing well today on new regimen of amlodipine 10mg  daily and lisinopril 40mg  daily.  BP at home reported as 110's/70's.  Tolerating  amlodipine without side effects.  P: - continue current regimen as above - follow up with PCP - BMET today.  Diabetic neuropathy, painful A: Neuropathy pain has resolved with restarting gabapentin and purchasing diabetic shoes.  P: - continue gabapentin - continue wearing diabetic shoes  Chronic hyponatremia A: History of chronic asymptomatic hyponatremia as seen per records back to 2012.  HCTZ discontinued at last office visit.  Appears to be euvolemic again on exam today.  P: - repeat BMET now that she is off HCTZ - further work-up may be warranted but will defer to PCP with whom patient has follow up in 3 weeks.

## 2015-09-23 LAB — BMP8+ANION GAP
ANION GAP: 20 mmol/L — AB (ref 10.0–18.0)
BUN/Creatinine Ratio: 22 (ref 9–23)
BUN: 11 mg/dL (ref 6–24)
CALCIUM: 9.3 mg/dL (ref 8.7–10.2)
CHLORIDE: 87 mmol/L — AB (ref 96–106)
CO2: 20 mmol/L (ref 18–29)
CREATININE: 0.49 mg/dL — AB (ref 0.57–1.00)
GFR calc Af Amer: 132 mL/min/{1.73_m2} (ref >59)
GFR calc non Af Amer: 115 mL/min/{1.73_m2} (ref >59)
GLUCOSE: 94 mg/dL (ref 65–99)
Potassium: 4.2 mmol/L (ref 3.5–5.2)
Sodium: 127 mmol/L — ABNORMAL LOW (ref 134–144)

## 2015-09-28 NOTE — Progress Notes (Signed)
Internal Medicine Clinic Attending  Case discussed with Dr. Wallace soon after the resident saw the patient.  We reviewed the resident's history and exam and pertinent patient test results.  I agree with the assessment, diagnosis, and plan of care documented in the resident's note. 

## 2015-10-04 MED FILL — ATORVASTATIN 10 MG TABLET: 10 | 30 days supply | Qty: 30 | Fill #1

## 2015-10-04 MED FILL — AMLODIPINE BESYLATE 10 MG T: 10 | 30 days supply | Qty: 30 | Fill #1

## 2015-10-10 ENCOUNTER — Encounter: Payer: Self-pay | Admitting: Internal Medicine

## 2015-10-17 MED FILL — GABAPENTIN 100 MG CAPSULE: 100 | 30 days supply | Qty: 90 | Fill #1

## 2015-11-06 MED FILL — ATORVASTATIN 10 MG TABLET: 10 | 30 days supply | Qty: 30 | Fill #2

## 2015-11-06 MED FILL — AMLODIPINE BESYLATE 10 MG T: 10 | 30 days supply | Qty: 30 | Fill #2

## 2015-12-05 MED FILL — GABAPENTIN 100 MG CAPSULE: 100 | 30 days supply | Qty: 90 | Fill #2

## 2015-12-05 MED FILL — AMLODIPINE BESYLATE 10 MG T: 10 | 30 days supply | Qty: 30 | Fill #3

## 2015-12-05 MED FILL — ATORVASTATIN 10 MG TABLET: 10 | 30 days supply | Qty: 30 | Fill #3

## 2015-12-11 ENCOUNTER — Other Ambulatory Visit: Payer: Self-pay

## 2015-12-11 DIAGNOSIS — R809 Proteinuria, unspecified: Principal | ICD-10-CM

## 2015-12-11 DIAGNOSIS — E1129 Type 2 diabetes mellitus with other diabetic kidney complication: Secondary | ICD-10-CM

## 2015-12-11 NOTE — Telephone Encounter (Signed)
metFORMIN (GLUCOPHAGE) 1000 MG , Refill request @ [facility] on [address]

## 2015-12-13 ENCOUNTER — Other Ambulatory Visit: Payer: Self-pay | Admitting: Internal Medicine

## 2015-12-13 MED ORDER — METFORMIN HCL 1000 MG PO TABS: 1000.0000 mg | ORAL_TABLET | Freq: Two times a day (BID) | ORAL | 3 refills | Status: DC

## 2015-12-15 NOTE — Telephone Encounter (Signed)
Received phone call from pt that she was out of metformin for 2 days  -Refilled metformin at the pharmacy of her choice

## 2015-12-26 ENCOUNTER — Encounter: Payer: Self-pay | Admitting: Internal Medicine

## 2016-01-11 MED FILL — ATORVASTATIN 10 MG TABLET: 10 | 30 days supply | Qty: 30 | Fill #4

## 2016-01-11 MED FILL — AMLODIPINE BESYLATE 10 MG T: 10 | 30 days supply | Qty: 30 | Fill #4

## 2016-02-13 ENCOUNTER — Encounter: Payer: Self-pay | Admitting: Internal Medicine

## 2016-02-13 ENCOUNTER — Ambulatory Visit (INDEPENDENT_AMBULATORY_CARE_PROVIDER_SITE_OTHER): Payer: Self-pay | Admitting: Internal Medicine

## 2016-02-13 VITALS — BP 159/85 | HR 78 | Temp 97.9°F | Ht 64.0 in | Wt 178.5 lb

## 2016-02-13 DIAGNOSIS — F17210 Nicotine dependence, cigarettes, uncomplicated: Secondary | ICD-10-CM

## 2016-02-13 DIAGNOSIS — I10 Essential (primary) hypertension: Secondary | ICD-10-CM

## 2016-02-13 DIAGNOSIS — F1020 Alcohol dependence, uncomplicated: Secondary | ICD-10-CM

## 2016-02-13 DIAGNOSIS — M25552 Pain in left hip: Secondary | ICD-10-CM

## 2016-02-13 DIAGNOSIS — E1129 Type 2 diabetes mellitus with other diabetic kidney complication: Secondary | ICD-10-CM

## 2016-02-13 DIAGNOSIS — Z79899 Other long term (current) drug therapy: Secondary | ICD-10-CM

## 2016-02-13 DIAGNOSIS — E871 Hypo-osmolality and hyponatremia: Secondary | ICD-10-CM

## 2016-02-13 DIAGNOSIS — R809 Proteinuria, unspecified: Principal | ICD-10-CM

## 2016-02-13 DIAGNOSIS — S76012D Strain of muscle, fascia and tendon of left hip, subsequent encounter: Secondary | ICD-10-CM

## 2016-02-13 LAB — POCT GLYCOSYLATED HEMOGLOBIN (HGB A1C): Hemoglobin A1C: 6.4

## 2016-02-13 LAB — GLUCOSE, CAPILLARY: GLUCOSE-CAPILLARY: 98 mg/dL (ref 65–99)

## 2016-02-13 MED ORDER — CARVEDILOL 6.25 MG PO TABS: 6.2500 mg | ORAL_TABLET | Freq: Two times a day (BID) | ORAL | 11 refills | Status: DC

## 2016-02-13 NOTE — Progress Notes (Signed)
   CC: Hypertension  HPI:  [name] is a [age] y.o. with past medical history as outlined below who presents to clinic for hypertension. Please see problem list for further details.  Past Medical History:  Diagnosis Date  . Anemia    when in late teens  . Anxiety   . Breast abscess   . Chlamydia   . Depression    PTSD also  . Diabetes mellitus without complication (HCC)   . Gonorrhea   . High cholesterol   . Hypercholesteremia   . Hypertension   . Recurrent upper respiratory infection (URI)    URI/SOB-treated with antibiotics-cleared up now  . Seizures (HCC)   . Substance abuse    alcohol  . Tuberculosis    was treated    Review of Systems:  Denies decreased appetite, n/v, sob, chest pain.   Physical Exam:  Vitals:   02/13/16 1530  BP: (!) 159/85  Pulse: 78  Temp: 97.9 F (36.6 C)  TempSrc: Oral  SpO2: 100%  Weight: 178 lb 8 oz (81 kg)  Height: 5\' 4"  (1.626 m)   Physical Exam  Constitutional: oriented to person, place, and time. appears well-developed and well-nourished. No distress.  HENT:  Head: Normocephalic and atraumatic.  Nose: Nose normal.  Cardiovascular: Normal rate, regular rhythm and normal heart sounds.  Exam reveals no gallop and no friction rub.   No murmur heard. Pulmonary/Chest: Effort normal and breath sounds normal. No respiratory distress.  has no wheezes.no rales.  Abdominal: Soft. Bowel sounds are normal.  exhibits no distension. There is no tenderness. There is no rebound and no guarding.  Skin: Skin is warm and dry. No rash noted.  not diaphoretic. No erythema. No pallor.  MSK: negative left straight leg test, pain on internal rotation of left hip, TTP of left adductor longus muscle, negative for left leg shortening or external rotation at rest  Assessment & Plan:   See Encounters Tab for problem based charting.  Patient discussed with Dr. Oswaldo DoneVincent

## 2016-02-13 NOTE — Patient Instructions (Signed)
Start taking coreg 6.25mg  twice a day.

## 2016-02-14 ENCOUNTER — Ambulatory Visit (HOSPITAL_COMMUNITY)
Admission: RE | Admit: 2016-02-14 | Discharge: 2016-02-14 | Disposition: A | Discharge status: Home / Self Care | Payer: Self-pay | Source: Ambulatory Visit | Attending: Internal Medicine | Admitting: Internal Medicine

## 2016-02-14 DIAGNOSIS — E1129 Type 2 diabetes mellitus with other diabetic kidney complication: Secondary | ICD-10-CM | POA: Insufficient documentation

## 2016-02-14 DIAGNOSIS — M5136 Other intervertebral disc degeneration, lumbar region: Secondary | ICD-10-CM | POA: Insufficient documentation

## 2016-02-14 DIAGNOSIS — R809 Proteinuria, unspecified: Secondary | ICD-10-CM | POA: Insufficient documentation

## 2016-02-14 LAB — BMP8+ANION GAP
ANION GAP: 21 mmol/L — AB (ref 10.0–18.0)
BUN/Creatinine Ratio: 19 (ref 9–23)
BUN: 11 mg/dL (ref 6–24)
CALCIUM: 9.6 mg/dL (ref 8.7–10.2)
CO2: 19 mmol/L (ref 18–29)
Chloride: 88 mmol/L — ABNORMAL LOW (ref 96–106)
Creatinine, Ser: 0.58 mg/dL (ref 0.57–1.00)
GFR calc Af Amer: 125 mL/min/{1.73_m2} (ref >59)
GFR, EST NON AFRICAN AMERICAN: 109 mL/min/{1.73_m2} (ref >59)
Glucose: 95 mg/dL (ref 65–99)
POTASSIUM: 4.8 mmol/L (ref 3.5–5.2)
Sodium: 128 mmol/L — ABNORMAL LOW (ref 134–144)

## 2016-02-14 LAB — SODIUM, URINE, RANDOM: Sodium, Ur: 22 mmol/L

## 2016-02-14 LAB — URINALYSIS, COMPLETE
BILIRUBIN UA: NEGATIVE
GLUCOSE, UA: NEGATIVE
KETONES UA: NEGATIVE
LEUKOCYTES UA: NEGATIVE
Nitrite, UA: NEGATIVE
PROTEIN UA: NEGATIVE
RBC UA: NEGATIVE
Specific Gravity, UA: <=1.005 — AB (ref 1.005–1.030)
UUROB: 0.2 mg/dL (ref 0.2–1.0)
pH, UA: 5 (ref 5.0–7.5)

## 2016-02-14 LAB — MICROSCOPIC EXAMINATION
CASTS: NONE SEEN /LPF
RBC, UA: NONE SEEN /hpf (ref 0–?)

## 2016-02-14 LAB — OSMOLALITY: Osmolality Meas: 257 mOsmol/kg — ABNORMAL LOW (ref 275–295)

## 2016-02-14 LAB — OSMOLALITY, URINE: Osmolality, Ur: 110 mOsmol/kg

## 2016-02-14 MED FILL — AMLODIPINE BESYLATE 10 MG T: 10 | 30 days supply | Qty: 30 | Fill #5

## 2016-02-14 MED FILL — ATORVASTATIN 10 MG TABLET: 10 | 30 days supply | Qty: 30 | Fill #5

## 2016-02-15 ENCOUNTER — Telehealth: Payer: Self-pay | Admitting: *Deleted

## 2016-02-15 MED ORDER — SODIUM CHLORIDE 1 G PO TABS: 1.0000 g | ORAL_TABLET | Freq: Two times a day (BID) | ORAL | 3 refills | Status: DC

## 2016-02-15 NOTE — Telephone Encounter (Signed)
Pt called to verify when to take her meds, reassured her, call ended

## 2016-02-15 NOTE — Assessment & Plan Note (Signed)
A: Pt states 3 years ago she slipped while getting out of the bathtub and twisted her hip internally. Since then she has had groin pain that will cause her to occasionally limp or give out. Over the past few months this has worsened. Pain is a sharp shooting pain. She has been taking aleve for pain. Exertion worsens pain and rest alleviates pain. She has risk factors for AVN including alcohol abuse, tobacco abuse, and obesity. This could also be MSK due to point tenderness on the left adductor longus vs osteoarthritis.   P: left hip xray ordered this visit concerning for possible AVN. Will order follow up MRI of left hip.

## 2016-02-15 NOTE — Progress Notes (Signed)
Internal Medicine Clinic Attending  Case discussed with Dr. Danella Pentonruong at the time of the visit.  We reviewed the resident's history and exam and pertinent patient test results.  I agree with the assessment, diagnosis, and plan of care documented in the resident's note.  Hypotonic hyponatremia confirmed today, urine osmolality low at 110. Etiology is likely low solute intake relative to high free water (either tea-toast, beer potomania or combination). We started 2L free water restriction and will add solute with Salt tabs 1g bid. Follow up within one month, we should try hard to get the serum sodium over 130 consistently to reduce risk of fall and fracture.

## 2016-02-15 NOTE — Assessment & Plan Note (Signed)
A: Pt has chronic hyponatremia without any apparent symptoms. She drinks two 24 ounce beers per day, last drink was the day prior to her clinic visit. On phone conversation 1/25 pt does not eat much and feels that it is a chore. She will eat a few bites of sausage in the mornings and does not like eat much meat. Her chronic hyponatremia could be due to alcohol use, hypothyroidism, or NSAID use.   P: checking BMET, urine osm, plasma osm, and urine sodium. Na was 128. Urine osm, plasma osm, and urine sodium were all low consistent with beer potomania. Will inform patient to decrease etoh intake, fluid restrict, increase protein in diet, and start taking 1 gram salt tabs BID.

## 2016-02-15 NOTE — Assessment & Plan Note (Signed)
A: BP elevated today. She is on lisinopril 40mg  which she has been taking BID instead of once daily as well as norvasc 10mg .   P: will start on coreg 6.25mg  BID. F/u in 1 month.

## 2016-02-28 ENCOUNTER — Ambulatory Visit (HOSPITAL_COMMUNITY)
Admission: RE | Admit: 2016-02-28 | Discharge: 2016-02-28 | Disposition: A | Discharge status: Home / Self Care | Payer: Self-pay | Source: Ambulatory Visit | Attending: Internal Medicine | Admitting: Internal Medicine

## 2016-02-28 DIAGNOSIS — X58XXXD Exposure to other specified factors, subsequent encounter: Secondary | ICD-10-CM | POA: Insufficient documentation

## 2016-02-28 DIAGNOSIS — M1612 Unilateral primary osteoarthritis, left hip: Secondary | ICD-10-CM | POA: Insufficient documentation

## 2016-02-28 DIAGNOSIS — M879 Osteonecrosis, unspecified: Secondary | ICD-10-CM | POA: Insufficient documentation

## 2016-02-28 DIAGNOSIS — S76012D Strain of muscle, fascia and tendon of left hip, subsequent encounter: Secondary | ICD-10-CM | POA: Insufficient documentation

## 2016-03-01 ENCOUNTER — Other Ambulatory Visit: Payer: Self-pay | Admitting: Internal Medicine

## 2016-03-01 ENCOUNTER — Telehealth: Payer: Self-pay | Admitting: Internal Medicine

## 2016-03-01 ENCOUNTER — Telehealth: Payer: Self-pay | Admitting: *Deleted

## 2016-03-01 DIAGNOSIS — I10 Essential (primary) hypertension: Secondary | ICD-10-CM

## 2016-03-01 DIAGNOSIS — M8700 Idiopathic aseptic necrosis of unspecified bone: Secondary | ICD-10-CM

## 2016-03-01 MED ORDER — LISINOPRIL 40 MG PO TABS: 40.0000 mg | ORAL_TABLET | Freq: Every day | ORAL | 3 refills | Status: DC

## 2016-03-01 NOTE — Progress Notes (Signed)
Was in formed by nurse tech that she had just gotten off the phone with the patient. Reportedly, the imaging results of the X-ray nor MRI were discussed with the patient. I contacted the patient this morning and discussed with her the imaging findings suspicious for AVN of left femoral head. She verbalized understanding and was appreciative that someone had reached out. Apologized that the ordering physician did not follow-up those results with her. [facility]. No questions. Refill given of Lisinopril 40 mg.

## 2016-03-01 NOTE — Telephone Encounter (Signed)
Attempted to call patient's home number today at 7:45pm to inform patient of her MRI results. Dr. Vincente LibertyMolt has placed an ortho referral for pt. Lela has spoke w/ patient regarding ortho referral. She should have a clinic appt in 1-2 months for her diabetes. Will message front desk to schedule her an appt for this and we can reassess pt's hip pain and ensure she has had f/u w/ ortho.   Gara Kronerruong, Emet Rafanan, MD Internal Medicine Resident, PGY [facility] Pager: [phone]

## 2016-03-01 NOTE — Telephone Encounter (Signed)
SPOKE WITH PATIENT REGARDING HER ORTHO REFERRAL . SHE HAS NO COVERAGE AND IS WILLING TO PAY THE REQUIRED 250.00 UP FRONT ([facility]).

## 2016-03-25 MED FILL — ATORVASTATIN 10 MG TABLET: 10 | 30 days supply | Qty: 30 | Fill #6

## 2016-04-23 ENCOUNTER — Other Ambulatory Visit: Payer: Self-pay | Admitting: Obstetrics and Gynecology

## 2016-04-23 DIAGNOSIS — Z1231 Encounter for screening mammogram for malignant neoplasm of breast: Secondary | ICD-10-CM

## 2016-04-23 MED FILL — ATORVASTATIN 10 MG TABLET: 10 | 30 days supply | Qty: 30 | Fill #7

## 2016-05-02 ENCOUNTER — Ambulatory Visit
Admission: RE | Admit: 2016-05-02 | Discharge: 2016-05-02 | Disposition: A | Discharge status: Home / Self Care | Source: Ambulatory Visit | Attending: Obstetrics and Gynecology | Admitting: Obstetrics and Gynecology

## 2016-05-02 ENCOUNTER — Other Ambulatory Visit: Payer: Self-pay | Admitting: Internal Medicine

## 2016-05-02 ENCOUNTER — Ambulatory Visit (HOSPITAL_COMMUNITY): Payer: Self-pay

## 2016-05-02 ENCOUNTER — Other Ambulatory Visit: Payer: Self-pay | Admitting: Obstetrics and Gynecology

## 2016-05-02 DIAGNOSIS — Z1231 Encounter for screening mammogram for malignant neoplasm of breast: Secondary | ICD-10-CM

## 2016-05-21 ENCOUNTER — Encounter: Payer: Self-pay | Admitting: Internal Medicine

## 2016-05-21 ENCOUNTER — Ambulatory Visit (INDEPENDENT_AMBULATORY_CARE_PROVIDER_SITE_OTHER): Admitting: Internal Medicine

## 2016-05-21 VITALS — BP 141/87 | HR 67 | Temp 98.0°F | Ht 64.0 in | Wt 172.4 lb

## 2016-05-21 DIAGNOSIS — R350 Frequency of micturition: Secondary | ICD-10-CM | POA: Diagnosis not present

## 2016-05-21 DIAGNOSIS — M87852 Other osteonecrosis, left femur: Secondary | ICD-10-CM | POA: Diagnosis not present

## 2016-05-21 DIAGNOSIS — F17210 Nicotine dependence, cigarettes, uncomplicated: Secondary | ICD-10-CM

## 2016-05-21 DIAGNOSIS — E1129 Type 2 diabetes mellitus with other diabetic kidney complication: Secondary | ICD-10-CM | POA: Diagnosis not present

## 2016-05-21 DIAGNOSIS — Z7984 Long term (current) use of oral hypoglycemic drugs: Secondary | ICD-10-CM | POA: Diagnosis not present

## 2016-05-21 DIAGNOSIS — R809 Proteinuria, unspecified: Secondary | ICD-10-CM

## 2016-05-21 DIAGNOSIS — E871 Hypo-osmolality and hyponatremia: Secondary | ICD-10-CM | POA: Diagnosis not present

## 2016-05-21 DIAGNOSIS — F1020 Alcohol dependence, uncomplicated: Secondary | ICD-10-CM

## 2016-05-21 DIAGNOSIS — R300 Dysuria: Secondary | ICD-10-CM

## 2016-05-21 DIAGNOSIS — Z79899 Other long term (current) drug therapy: Secondary | ICD-10-CM | POA: Diagnosis not present

## 2016-05-21 DIAGNOSIS — I10 Essential (primary) hypertension: Secondary | ICD-10-CM | POA: Diagnosis not present

## 2016-05-21 DIAGNOSIS — M8700 Idiopathic aseptic necrosis of unspecified bone: Secondary | ICD-10-CM

## 2016-05-21 LAB — POCT URINALYSIS DIPSTICK
BILIRUBIN UA: NEGATIVE
GLUCOSE UA: NEGATIVE
KETONES UA: NEGATIVE
Nitrite, UA: NEGATIVE
Protein, UA: NEGATIVE
RBC UA: NEGATIVE
Urobilinogen, UA: 0.2 E.U./dL
pH, UA: 5 (ref 5.0–8.0)

## 2016-05-21 LAB — POCT GLYCOSYLATED HEMOGLOBIN (HGB A1C): HEMOGLOBIN A1C: 6.4

## 2016-05-21 LAB — GLUCOSE, CAPILLARY: GLUCOSE-CAPILLARY: 75 mg/dL (ref 65–99)

## 2016-05-21 MED ORDER — METFORMIN HCL 1000 MG PO TABS: 1000.0000 mg | ORAL_TABLET | Freq: Two times a day (BID) | ORAL | 3 refills | Status: DC

## 2016-05-21 MED ORDER — HYDROXYZINE PAMOATE 25 MG PO CAPS: 25.0000 mg | ORAL_CAPSULE | Freq: Every evening | ORAL | 1 refills | Status: DC | PRN

## 2016-05-21 NOTE — Assessment & Plan Note (Addendum)
Blood pressure elevated today at 147/85 and on repeat 141/87. Patient reports she checks her pressure daily and she checks her blood sugar and is usually in the 110s over 70s. Suspect patient's blood pressure is controlled at home and have asked her to record blood pressure readings over the next month and bring them to her next appointment for further review. -Lisinopril 40 mg -Coreg 6.25 mg twice daily -Amlodipine 10 mg daily -Consider adding spironolactone 12.5 mg if patient continues to be hypertensive at follow-up visit

## 2016-05-21 NOTE — Patient Instructions (Addendum)
Mrs. Riling it was a pleasure to meet you today. I look forward to working with you over the next several years.   Your blood pressure is elevated today. Please ensure you are taking your anti-hypertensive medications correctly (Lisinopril 40 mg, Amlodipine 10 mg and Coreg 6.25 mg twice daily). Also, please monitor your blood pressure at home to ensure you are on the proper medications.   Today we talked about your diabetes. We checked a HbA1c which is a measurement of your average blood sugars over the past 3 months. It was 6.4% which is excellent. You did have some episodes of low blood sugar and I believe that is due to your diet. Please try to increase your oral intake of real foods.   You have a persistently low blood sodium level. I believe this is due to your diet being mostly liquids with little actual food. I need you to increase your food intake, preferably meats, vegetables and fruits.   Today you mentioned burning and frequent urination. I checked your urine which was negative for infection.   We'll see you back in 4 weeks to check your blood pressure and sodium!

## 2016-05-21 NOTE — Assessment & Plan Note (Signed)
Chronic hyponatremia in setting of chronic alcohol abuse and poor PO intake. Reports eating feels like a chore and she eats maybe a half sandwich a day with some crackers. 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 She was instructed to decr alcohol use and take salt tabs, which she has not yet done.  Encouraged increased solute intake and decreased alcohol consumption. -Follow-up 1 month at which time obtain bmet to follow sodium

## 2016-05-21 NOTE — Progress Notes (Signed)
   CC: Follow-up of type 2 diabetes, hypertension and also chronic dysuria  HPI:  [name] is a 51 y.o. F who presents today for follow-up of her type 2 diabetes, HTN. Please see problem based charting for details of the patients chronic medical conditions.   Past Medical History:  Diagnosis Date  . Anemia    when in late teens  . Anxiety   . Breast abscess   . Chlamydia   . Depression    PTSD also  . Diabetes mellitus without complication (HCC)   . Gonorrhea   . High cholesterol   . Hypercholesteremia   . Hypertension   . Recurrent upper respiratory infection (URI)    URI/SOB-treated with antibiotics-cleared up now  . Seizures (HCC)   . Substance abuse    alcohol  . Tuberculosis    was treated   Review of Systems:  Review of Systems  Constitutional: Positive for malaise/fatigue. Negative for chills, fever and weight loss.  Respiratory: Negative for cough and shortness of breath.   Cardiovascular: Negative for chest pain and leg swelling.  Gastrointestinal: Negative for abdominal pain, nausea and vomiting.  Genitourinary: Positive for dysuria and frequency. Negative for flank pain and urgency.  Musculoskeletal: Negative for back pain and myalgias.   Physical Exam: Physical Exam  Constitutional: She appears well-developed and well-nourished. No distress.  HENT:  Head: Normocephalic and atraumatic.  Cardiovascular: Normal rate, regular rhythm, normal heart sounds and intact distal pulses.   Pulmonary/Chest: Effort normal and breath sounds normal. No respiratory distress. She has no wheezes.  Abdominal: Soft. Bowel sounds are normal. She exhibits no distension.  Skin: Skin is warm and dry. She is not diaphoretic.  Psychiatric: She has a normal mood and affect. Judgment normal.    Vitals:   05/21/16 1415 05/21/16 1512  BP: (!) 147/85 (!) 141/87  Pulse: 71 67  Temp: 98 F (36.7 C)   TempSrc: Oral   SpO2: 98%   Weight: 172 lb 6.4 oz (78.2 kg)     Height:  (1.626 m)     Assessment & Plan:   See Encounters Tab for problem based charting.  Patient discussed with Dr. Cleda Daub

## 2016-05-21 NOTE — Assessment & Plan Note (Signed)
Complains of a long history of intermittent dysuria and increased frequency made worse by stressful situations and poor diet. UA today negative for infection suspect this could be secondary to chronic bladder irritation such as interstitial cystitis. She does however have significant tobacco and alcohol history of eating urogenital malignancy up with a differential however UA without hematuria. -Hydroxyzine 25 mg daily at bedtime when necessary -Consider cystoscopy versus urology referral should symptoms persist

## 2016-05-21 NOTE — Assessment & Plan Note (Signed)
Lab Results  Component Value Date   HGBA1C 6.4 05/21/2016   HGBA1C 6.4 02/13/2016   HGBA1C 5.6 08/29/2015     Assessment: Diabetes control:  well-controlled Progress toward A1C goal:   at goal Comments: Reviewed glucometer meetings. One hypoglycemic reading at 60 yesterday morning, patient denied any symptoms. Reports compliance with metformin 2000 mg daily. Poor appetite and decreased by mouth intake.  Plan: Medications: Metformin 2000 mg daily Home glucose monitoring: Frequency:  if she were to experience symptoms of hypoglycemia Timing:  as above Instruction/counseling given: Applauded patient for her better glucose control. Encourage patient to have better by mouth intake to see if this improves her blood sugar. Other plans: May need to decrease dose of metformin should patient continue to have hypoglycemia.

## 2016-05-21 NOTE — Assessment & Plan Note (Signed)
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

## 2016-05-22 ENCOUNTER — Ambulatory Visit
Admission: RE | Admit: 2016-05-22 | Discharge: 2016-05-22 | Disposition: A | Discharge status: Home / Self Care | Source: Ambulatory Visit | Attending: Family Medicine | Admitting: Family Medicine

## 2016-05-22 DIAGNOSIS — Z1231 Encounter for screening mammogram for malignant neoplasm of breast: Secondary | ICD-10-CM

## 2016-05-22 NOTE — Progress Notes (Signed)
Internal Medicine Clinic Attending  Case discussed with Dr. Molt at the time of the visit.  We reviewed the resident's history and exam and pertinent patient test results.  I agree with the assessment, diagnosis, and plan of care documented in the resident's note. 

## 2016-06-04 ENCOUNTER — Emergency Department (HOSPITAL_COMMUNITY)
Admission: EM | Admit: 2016-06-04 | Discharge: 2016-06-04 | Disposition: A | Discharge status: Home / Self Care | Attending: Emergency Medicine | Admitting: Emergency Medicine

## 2016-06-04 ENCOUNTER — Encounter: Admitting: Internal Medicine

## 2016-06-04 ENCOUNTER — Encounter (HOSPITAL_COMMUNITY): Payer: Self-pay | Admitting: Emergency Medicine

## 2016-06-04 DIAGNOSIS — Z79899 Other long term (current) drug therapy: Secondary | ICD-10-CM | POA: Diagnosis not present

## 2016-06-04 DIAGNOSIS — E1140 Type 2 diabetes mellitus with diabetic neuropathy, unspecified: Secondary | ICD-10-CM | POA: Diagnosis not present

## 2016-06-04 DIAGNOSIS — F17210 Nicotine dependence, cigarettes, uncomplicated: Secondary | ICD-10-CM | POA: Insufficient documentation

## 2016-06-04 DIAGNOSIS — Z7984 Long term (current) use of oral hypoglycemic drugs: Secondary | ICD-10-CM | POA: Insufficient documentation

## 2016-06-04 DIAGNOSIS — I10 Essential (primary) hypertension: Secondary | ICD-10-CM | POA: Insufficient documentation

## 2016-06-04 DIAGNOSIS — M5481 Occipital neuralgia: Secondary | ICD-10-CM | POA: Insufficient documentation

## 2016-06-04 DIAGNOSIS — R51 Headache: Secondary | ICD-10-CM | POA: Diagnosis present

## 2016-06-04 MED ORDER — IBUPROFEN 400 MG PO TABS: 400.0000 mg | ORAL_TABLET | Freq: Four times a day (QID) | ORAL | 0 refills | Status: DC | PRN

## 2016-06-04 MED ORDER — GABAPENTIN 100 MG PO CAPS
200.0000 mg | ORAL_CAPSULE | Freq: Once | ORAL | Status: AC
  Administered 2016-06-04: 200 mg via ORAL
  Filled 2016-06-04: qty 2

## 2016-06-04 MED ORDER — GABAPENTIN 100 MG PO CAPS: 100.0000 mg | ORAL_CAPSULE | Freq: Three times a day (TID) | ORAL | 0 refills | Status: DC

## 2016-06-04 MED ORDER — IBUPROFEN 400 MG PO TABS
400.0000 mg | ORAL_TABLET | Freq: Once | ORAL | Status: AC
  Administered 2016-06-04: 400 mg via ORAL
  Filled 2016-06-04: qty 1

## 2016-06-04 NOTE — ED Triage Notes (Signed)
Pt sts left sided head pain x several days into neck worse with movement

## 2016-06-04 NOTE — ED Provider Notes (Signed)
[facility]-EMERGENCY DEPT Provider Note   CSN: [id number] Arrival date & time: 06/04/16  [id number]   By signing my name below, I, Soijett Blue, attest that this documentation has been prepared under the direction and in the presence of Raeford RazorKohut, Denyla Cortese, MD. Electronically Signed: Soijett Blue, ED Scribe. 06/04/16. 1:24 PM.  History   Chief Complaint Chief Complaint  Patient presents with  . Headache    HPI Edward QualiaCharlotte Cain Nofziger is a 51 y.o. female with a PMHx of HTN, DM, who presents to the Emergency Department complaining of intermittent, shooting, posterior HA, left > right onset 3 days ago. Pt notes that her posterior HA radiates to her neck and is worsened with movement. Pt reports that when she has the shooting sensation to her posterior head it last for less than 5 minutes. Pt has tried her husband Rx tramadol with mild relief of her symptoms. She notes that she had elevated blood pressure reading 2 days ago that was in the 100's systolically and diastolic. She states that she has an appointment with her PCP today at 2:15 PM. She denies hitting her head, neck stiffness, color change, wound, recent fall, and any other symptoms.     The history is provided by the patient. No language interpreter was used.    Past Medical History:  Diagnosis Date  . Anemia    when in late teens  . Anxiety   . Breast abscess   . Chlamydia   . Depression    PTSD also  . Diabetes mellitus without complication (HCC)   . Gonorrhea   . High cholesterol   . Hypercholesteremia   . Hypertension   . Recurrent upper respiratory infection (URI)    URI/SOB-treated with antibiotics-cleared up now  . Seizures (HCC)   . Substance abuse    alcohol  . Tuberculosis    was treated    Patient Active Problem List   Diagnosis Date Noted  . Dysuria 05/21/2016  . Avascular necrosis (HCC) 03/01/2016  . Chronic hyponatremia 08/29/2015  . Acute URI 08/29/2015  . Strain of left hip adductor muscle 05/30/2015  .  Tobacco use disorder 04/12/2015  . Abnormal brain MRI 03/10/2015  . Elevated liver enzymes 04/14/2013  . Diabetic neuropathy, painful (HCC) 04/14/2013  . Alcohol dependence (HCC) 03/23/2013  . Substance induced mood disorder (HCC) 03/22/2013  . Depression 02/05/2012  . Hyperlipidemia 02/05/2012  . Hypertension 01/07/2012  . Type 2 diabetes mellitus with microalbuminuria, without long-term current use of insulin (HCC) 01/07/2012    Past Surgical History:  Procedure Laterality Date  . BREAST BIOPSY  11/13/2010  . BREAST LUMPECTOMY    . INCISE AND DRAIN ABCESS  01/03/11   breast abscess  . TONGUE SURGERY  2008   lump removed    OB History    Gravida Para Term Preterm AB Living   2 2 2     2    SAB TAB Ectopic Multiple Live Births                   Home Medications    Prior to Admission medications   Medication Sig Start Date End Date Taking? Authorizing Provider  albuterol (PROVENTIL HFA;VENTOLIN HFA) 108 (90 Base) MCG/ACT inhaler Inhale 2 puffs into the lungs every 6 (six) hours as needed for wheezing or shortness of breath. 04/12/15   Jana Halfaylor, Nicholas A, MD  amLODipine (NORVASC) 10 MG tablet Take 1 tablet (10 mg total) by mouth daily. 08/29/15   Servando SnareBurns, Alexa R, MD  atorvastatin (LIPITOR) 10 MG tablet Take 1 tablet (10 mg total) by mouth daily. 08/29/15   Burns, Tinnie Gens, MD  carvedilol (COREG) 6.25 MG tablet Take 1 tablet (6.25 mg total) by mouth 2 (two) times daily. 02/13/16 02/12/17  Denton Brick, MD  gabapentin (NEURONTIN) 100 MG capsule Take 1 capsule (100 mg total) by mouth 3 (three) times daily. 08/29/15   Burns, Tinnie Gens, MD  guaiFENesin (MUCINEX) 600 MG 12 hr tablet Take 1 tablet (600 mg total) by mouth 2 (two) times daily. 08/29/15   Burns, Tinnie Gens, MD  guaiFENesin-dextromethorphan (ROBITUSSIN DM) 100-10 MG/5ML syrup Take 5 mLs by mouth every 4 (four) hours as needed for cough. 08/29/15   Burns, Tinnie Gens, MD  hydrOXYzine (VISTARIL) 25 MG capsule Take 1 capsule (25 mg total) by  mouth at bedtime as needed for itching or anxiety (bladder pain). 05/21/16   Molt, Bethany, DO  lisinopril (PRINIVIL,ZESTRIL) 40 MG tablet Take 1 tablet (40 mg total) by mouth daily. 03/01/16   Molt, Bethany, DO  metFORMIN (GLUCOPHAGE) 1000 MG tablet Take 1 tablet (1,000 mg total) by mouth 2 (two) times daily with a meal. 05/21/16   Molt, Bethany, DO  naproxen sodium (ALEVE) 220 MG tablet Take 220-440 mg by mouth daily as needed (FOR LEG PAIN).    [provider]  sodium chloride 1 g tablet Take 1 tablet (1 g total) by mouth 2 (two) times daily with a meal. 02/15/16   Denton Brick, MD  trolamine salicylate (ASPERCREME) 10 % cream Apply 1 application topically daily as needed for muscle pain (and leg pain).     [provider]    Family History Family History  Problem Relation Age of Onset  . Breast cancer Mother   . Breast cancer Sister   . Breast cancer Maternal Aunt     Social History Social History  Substance Use Topics  . Smoking status: Current Some Day Smoker    Packs/day: 1.00    Types: Cigarettes    Last attempt to quit: 12/11/2012  . Smokeless tobacco: Never Used     Comment: 3 cigs per day  . Alcohol use Yes     Comment: Beer.     Allergies   Patient has no known allergies.   Review of Systems Review of Systems  Musculoskeletal: Positive for neck pain. Negative for neck stiffness.  Skin: Negative for color change and wound.  Neurological: Positive for headaches (posterior).  All other systems reviewed and are negative.    Physical Exam Updated Vital Signs BP 116/79 (BP Location: Left Arm)   Pulse 83   Temp 98.3 F (36.8 C) (Oral)   Resp 16   SpO2 97%   Physical Exam  Constitutional: She is oriented to person, place, and time. She appears well-developed and well-nourished.  HENT:  Head: Normocephalic and atraumatic.  Right Ear: External ear normal.  Left Ear: External ear normal.  Nose: Nose normal.  Point tender to posterior left  occipital scalp.   Eyes: Right eye exhibits no discharge. Left eye exhibits no discharge.  Neck: Neck supple. No spinous process tenderness and no muscular tenderness present.  No neck tenderness. Supple.   Cardiovascular: Normal rate, regular rhythm and normal heart sounds.  Exam reveals no gallop and no friction rub.   No murmur heard. Pulmonary/Chest: Effort normal and breath sounds normal. No respiratory distress. She has no wheezes. She has no rales.  Abdominal: Soft. There is no tenderness.  Neurological: She is  alert and oriented to person, place, and time. She has normal strength. No cranial nerve deficit.  Skin: Skin is warm and dry.  Nursing note and vitals reviewed.    ED Treatments / Results  DIAGNOSTIC STUDIES: Oxygen Saturation is 97% on RA, nl by my interpretation.    COORDINATION OF CARE: 1:18 PM Discussed treatment plan with pt at bedside and pt agreed to plan.   Labs (all labs ordered are listed, but only abnormal results are displayed) Labs Reviewed - No data to display  EKG  EKG Interpretation None       Radiology No results found.  Procedures Procedures (including critical care time)  Medications Ordered in ED Medications - No data to display   Initial Impression / Assessment and Plan / ED Course  I have reviewed the triage vital signs and the nursing notes.  Pertinent labs & imaging results that were available during my care of the patient were reviewed by me and considered in my medical decision making (see chart for details).     50yF with headaches. I think this is occipital neuralgia. She has point tenderness on her scalp in the expected location L occipital nerve. Intermittent, sharp pain lasting minutes. Pain extends into parietal/temporal region. She has been on gabapentin previously for neuropathy, but not currently. Will place her back on it at low dose of 100mg  TID. This may help. PRN NSAIDs. She may benefit from injection if she has  further persistent symptoms.   Final Clinical Impressions(s) / ED Diagnoses   Final diagnoses:  Occipital neuralgia of left side    New Prescriptions New Prescriptions   No medications on file   I personally preformed the services scribed in my presence. The recorded information has been reviewed is accurate. Raeford Razor, MD.     Raeford Razor, MD 06/04/16 1336

## 2016-06-04 NOTE — Progress Notes (Signed)
This encounter was created in error - please disregard.

## 2016-06-18 ENCOUNTER — Ambulatory Visit: Payer: Self-pay

## 2016-06-21 ENCOUNTER — Encounter: Payer: Self-pay | Admitting: Internal Medicine

## 2016-06-21 ENCOUNTER — Ambulatory Visit (INDEPENDENT_AMBULATORY_CARE_PROVIDER_SITE_OTHER): Admitting: Internal Medicine

## 2016-06-21 VITALS — BP 129/71 | HR 63 | Temp 98.4°F | Ht 63.0 in | Wt 170.0 lb

## 2016-06-21 DIAGNOSIS — Z7984 Long term (current) use of oral hypoglycemic drugs: Secondary | ICD-10-CM

## 2016-06-21 DIAGNOSIS — Z79899 Other long term (current) drug therapy: Secondary | ICD-10-CM

## 2016-06-21 DIAGNOSIS — E1129 Type 2 diabetes mellitus with other diabetic kidney complication: Secondary | ICD-10-CM | POA: Diagnosis not present

## 2016-06-21 DIAGNOSIS — R809 Proteinuria, unspecified: Secondary | ICD-10-CM | POA: Diagnosis not present

## 2016-06-21 DIAGNOSIS — I10 Essential (primary) hypertension: Secondary | ICD-10-CM

## 2016-06-21 DIAGNOSIS — M5481 Occipital neuralgia: Secondary | ICD-10-CM

## 2016-06-21 DIAGNOSIS — F17210 Nicotine dependence, cigarettes, uncomplicated: Secondary | ICD-10-CM | POA: Diagnosis not present

## 2016-06-21 MED ORDER — GABAPENTIN 300 MG PO CAPS: 300.0000 mg | ORAL_CAPSULE | Freq: Three times a day (TID) | ORAL | 2 refills | Status: DC

## 2016-06-21 MED ORDER — ATORVASTATIN CALCIUM 10 MG PO TABS: 10.0000 mg | ORAL_TABLET | Freq: Every day | ORAL | 3 refills | Status: DC

## 2016-06-21 NOTE — Assessment & Plan Note (Signed)
Her last A1c done in the beginning of May was 6.4.  Her diabetes seems under well controlled.  She is compliant with her metformin.  -Continue current dose of metformin. -Repeat A1c in 3 months.

## 2016-06-21 NOTE — Assessment & Plan Note (Signed)
BP Readings from Last 3 Encounters:  06/21/16 129/71  06/04/16 135/83  06/04/16 116/79   Her blood pressure seems very well controlled.  Continue current management of amlodipine, lisinopril and Coreg

## 2016-06-21 NOTE — Patient Instructions (Signed)
Thank you for visiting clinic today. I'm giving you a prescription of gabapentin 300 mg you can take it up to 3 times a day. If it does not help with your pain please call the office. Please follow-up in 4 weeks.

## 2016-06-21 NOTE — Progress Notes (Signed)
   CC: Left occipital pain.  HPI:  [name] is a 51 y.o. with past medical history as listed below came to the clinic for follow-up of her left occipital pain.  She was seen in ED 2 weeks ago with a complaint of transient shooting left occipital pain lasting for a few seconds, occurring multiple times daily. She was told that she had occipital neuralgia and given a prescription of gabapentin to try. According to patient gabapentin is helping but not completely eliminating her symptoms, she continued to have 3-4 times daily episodes. She was also wondering if we can increase the dose of gabapentin. She denies any paresthesias, lacrimation or change in her vision. She denies any headaches apart from these pain.  She had no other complaints.  Past Medical History:  Diagnosis Date  . Anemia    when in late teens  . Anxiety   . Breast abscess   . Chlamydia   . Depression    PTSD also  . Diabetes mellitus without complication (HCC)   . Gonorrhea   . High cholesterol   . Hypercholesteremia   . Hypertension   . Recurrent upper respiratory infection (URI)    URI/SOB-treated with antibiotics-cleared up now  . Seizures (HCC)   . Substance abuse    alcohol  . Tuberculosis    was treated    Review of Systems:  As per HPI.  Physical Exam:  Vitals:   06/21/16 1411  BP: 129/71  Pulse: 63  Temp: 98.4 F (36.9 C)  TempSrc: Oral  SpO2: 100%  Weight: 170 lb (77.1 kg)  Height: 5\' 3"  (1.6 m)    General: Vital signs reviewed.  Patient is well-developed and well-nourished, in no acute distress and cooperative with exam.  Head: Normocephalic and atraumatic. No localized tenderness. Cardiovascular: RRR, S1 normal, S2 normal, no murmurs, gallops, or rubs. Pulmonary/Chest: Clear to auscultation bilaterally, no wheezes, rales, or rhonchi. Abdominal: Soft, non-tender, non-distended, BS +, no masses, organomegaly, or guarding present.  Extremities: No lower extremity edema  bilaterally,  pulses symmetric and intact bilaterally. No cyanosis or clubbing. Neurological: A&O x3, Strength is normal and symmetric bilaterally, cranial nerve II-XII are grossly intact, no focal motor deficit, sensory intact to light touch bilaterally.  Skin: Warm, dry and intact. No rashes or erythema. Psychiatric: Normal mood and affect. speech and behavior is normal. Cognition and memory are normal.  Assessment & Plan:   See Encounters Tab for problem based charting.  Patient discussed with Dr. Rogelia BogaButcher.

## 2016-06-21 NOTE — Assessment & Plan Note (Signed)
She was seen in ED 2 weeks ago with a complaint of transient shooting left occipital pain lasting for a few seconds, occurring multiple times daily. She was told that she had occipital neuralgia and given a prescription of gabapentin to try. According to patient gabapentin is helping but not completely eliminating her symptoms, she continued to have 3-4 times daily episodes. She was also wondering if we can increase the dose of gabapentin. She denies any paresthesias, lacrimation or change in her vision. She denies any headaches apart from these pain.  -Pain is more consistent with occipital neuralgia.  -Increase gabapentin to 300 mg 3 times a day. -Follow-up in 4 weeks.

## 2016-06-24 NOTE — Progress Notes (Signed)
Internal Medicine Clinic Attending  Case discussed with Dr. Amin at the time of the visit.  We reviewed the resident's history and exam and pertinent patient test results.  I agree with the assessment, diagnosis, and plan of care documented in the resident's note.    

## 2016-06-25 ENCOUNTER — Ambulatory Visit: Payer: Self-pay

## 2016-10-04 ENCOUNTER — Telehealth: Payer: Self-pay | Admitting: Internal Medicine

## 2016-10-04 MED ORDER — GABAPENTIN 300 MG PO CAPS: 300.0000 mg | ORAL_CAPSULE | Freq: Three times a day (TID) | ORAL | 2 refills | Status: DC

## 2016-10-04 NOTE — Telephone Encounter (Signed)
   Reason for call:   I received a call from Ms. Neville Route Val at 10:35 AM indicating that she is taking gabapentin for occipital neuralgia and needs a refill. Reports the increased dose of 300 mg tid given to her at her 6/1 visit has improved her headache tremendously.    Pertinent Data:   Gabapentin refill request   Assessment / Plan / Recommendations:   Refilled gabapentin, Rx sent to [facility] at [facility]  Needs follow up with PCP, will route message to PCP and front desk  As always, pt is advised that if symptoms worsen or new symptoms arise, they should go to an urgent care facility or to to ER for further evaluation.   Valentino Nose, MD   10/04/2016, 10:36 AM

## 2016-11-08 ENCOUNTER — Telehealth: Payer: Self-pay | Admitting: Internal Medicine

## 2016-11-08 ENCOUNTER — Other Ambulatory Visit: Payer: Self-pay | Admitting: *Deleted

## 2016-11-08 DIAGNOSIS — R300 Dysuria: Secondary | ICD-10-CM

## 2016-11-08 MED ORDER — HYDROXYZINE PAMOATE 25 MG PO CAPS: 25.0000 mg | ORAL_CAPSULE | Freq: Every evening | ORAL | 1 refills | Status: DC | PRN

## 2016-11-08 NOTE — Telephone Encounter (Signed)
Patient would like a callback regarding medicine taking/side effects

## 2016-11-08 NOTE — Telephone Encounter (Signed)
Request sent to dr's rathore, molt and attending

## 2016-11-11 ENCOUNTER — Encounter (INDEPENDENT_AMBULATORY_CARE_PROVIDER_SITE_OTHER): Payer: Self-pay

## 2016-11-11 ENCOUNTER — Encounter: Payer: Self-pay | Admitting: Internal Medicine

## 2016-11-11 ENCOUNTER — Ambulatory Visit (INDEPENDENT_AMBULATORY_CARE_PROVIDER_SITE_OTHER): Admitting: Internal Medicine

## 2016-11-11 VITALS — BP 158/90 | HR 67 | Temp 98.0°F | Ht 63.0 in | Wt 178.6 lb

## 2016-11-11 DIAGNOSIS — I10 Essential (primary) hypertension: Secondary | ICD-10-CM | POA: Diagnosis not present

## 2016-11-11 DIAGNOSIS — Z7984 Long term (current) use of oral hypoglycemic drugs: Secondary | ICD-10-CM | POA: Diagnosis not present

## 2016-11-11 DIAGNOSIS — Z0000 Encounter for general adult medical examination without abnormal findings: Secondary | ICD-10-CM

## 2016-11-11 DIAGNOSIS — R809 Proteinuria, unspecified: Secondary | ICD-10-CM

## 2016-11-11 DIAGNOSIS — R350 Frequency of micturition: Secondary | ICD-10-CM

## 2016-11-11 DIAGNOSIS — F17210 Nicotine dependence, cigarettes, uncomplicated: Secondary | ICD-10-CM | POA: Diagnosis not present

## 2016-11-11 DIAGNOSIS — K5900 Constipation, unspecified: Secondary | ICD-10-CM

## 2016-11-11 DIAGNOSIS — Z23 Encounter for immunization: Secondary | ICD-10-CM

## 2016-11-11 DIAGNOSIS — Z79899 Other long term (current) drug therapy: Secondary | ICD-10-CM | POA: Diagnosis not present

## 2016-11-11 DIAGNOSIS — E871 Hypo-osmolality and hyponatremia: Secondary | ICD-10-CM | POA: Diagnosis not present

## 2016-11-11 DIAGNOSIS — E1129 Type 2 diabetes mellitus with other diabetic kidney complication: Secondary | ICD-10-CM | POA: Diagnosis not present

## 2016-11-11 DIAGNOSIS — E785 Hyperlipidemia, unspecified: Secondary | ICD-10-CM

## 2016-11-11 LAB — GLUCOSE, CAPILLARY: GLUCOSE-CAPILLARY: 98 mg/dL (ref 65–99)

## 2016-11-11 LAB — POCT GLYCOSYLATED HEMOGLOBIN (HGB A1C): Hemoglobin A1C: 6.7

## 2016-11-11 NOTE — Progress Notes (Signed)
   CC: Diabetes follow up  HPI:  [name] is a 51 y.o. f with hypertension, diabetes mellitus, hypercholesterolemia who presents for diabetes follow up. Please see assessment and plan for additional information.   Past Medical History:  Diagnosis Date  . Anemia    when in late teens  . Anxiety   . Breast abscess   . Chlamydia   . Depression    PTSD also  . Diabetes mellitus without complication (HCC)   . Gonorrhea   . High cholesterol   . Hypercholesteremia   . Hypertension   . Recurrent upper respiratory infection (URI)    URI/SOB-treated with antibiotics-cleared up now  . Seizures (HCC)   . Substance abuse    alcohol  . Tuberculosis    was treated   Review of Systems:   Review of Systems  Constitutional: Negative for chills and fever.  Eyes: Negative for blurred vision.  Cardiovascular: Negative for chest pain and palpitations.  Gastrointestinal: Positive for constipation. Negative for diarrhea, nausea and vomiting.  Genitourinary: Positive for frequency. Negative for urgency.  Neurological: Negative for dizziness and headaches.    Physical Exam:  There were no vitals filed for this visit.  Physical Exam  Constitutional: She appears well-developed and well-nourished. No distress.  HENT:  Head: Normocephalic and atraumatic.  Eyes: Conjunctivae are normal.  Cardiovascular: Normal rate, regular rhythm and normal heart sounds.   Pulmonary/Chest: Effort normal and breath sounds normal. No respiratory distress. She has no wheezes.  Abdominal: Soft. Bowel sounds are normal. She exhibits no distension. There is no tenderness.  Psychiatric: She has a normal mood and affect. Her behavior is normal. Judgment and thought content normal.    Assessment & Plan:   See Encounters Tab for problem based charting.  Patient seen with Dr. Heide SparkNarendra

## 2016-11-11 NOTE — Patient Instructions (Signed)
It was a pleasure to see you today [name]. Please make the following changes:  -Take blood glucose measurements every morning for 2 weeks prior to eating anything. Please also take a blood glucose measurement randomly during the day for 2 weeks.  -Please diet and exercise.   If you have any questions or concerns, please call our clinic at [phone] between 9am-5pm and after hours call [phone] and ask for the internal medicine resident on call. If you feel you are having a medical emergency please call 911.   Thank you, we look forward to help you remain healthy!  Carrie CourierVahini Lashena Signer, MD Internal Medicine PGY1   Carbohydrate Counting for Diabetes Mellitus, Adult Carbohydrate counting is a method for keeping track of how many carbohydrates you eat. Eating carbohydrates naturally increases the amount of sugar (glucose) in the blood. Counting how many carbohydrates you eat helps keep your blood glucose within normal limits, which helps you manage your diabetes (diabetes mellitus). It is important to know how many carbohydrates you can safely have in each meal. This is different for every person. A diet and nutrition specialist (registered dietitian) can help you make a meal plan and calculate how many carbohydrates you should have at each meal and snack. Carbohydrates are found in the following foods:  Grains, such as breads and cereals.  Dried beans and soy products.  Starchy vegetables, such as potatoes, peas, and corn.  Fruit and fruit juices.  Milk and yogurt.  Sweets and snack foods, such as cake, cookies, candy, chips, and soft drinks.  How do I count carbohydrates? There are two ways to count carbohydrates in food. You can use either of the methods or a combination of both. Reading "Nutrition Facts" on packaged food The "Nutrition Facts" list is included on the labels of almost all packaged foods and beverages in the [place] It includes:  The serving size.  Information  about nutrients in each serving, including the grams (g) of carbohydrate per serving.  To use the "Nutrition Facts":  Decide how many servings you will have.  Multiply the number of servings by the number of carbohydrates per serving.  The resulting number is the total amount of carbohydrates that you will be having.  Learning standard serving sizes of other foods When you eat foods containing carbohydrates that are not packaged or do not include "Nutrition Facts" on the label, you need to measure the servings in order to count the amount of carbohydrates:  Measure the foods that you will eat with a food scale or measuring cup, if needed.  Decide how many standard-size servings you will eat.  Multiply the number of servings by 15. Most carbohydrate-rich foods have about 15 g of carbohydrates per serving. ? For example, if you eat 8 oz (170 g) of strawberries, you will have eaten 2 servings and 30 g of carbohydrates (2 servings x 15 g = 30 g).  For foods that have more than one food mixed, such as soups and casseroles, you must count the carbohydrates in each food that is included.  The following list contains standard serving sizes of common carbohydrate-rich foods. Each of these servings has about 15 g of carbohydrates:   hamburger bun or  English muffin.   oz (15 mL) syrup.   oz (14 g) jelly.  1 slice of bread.  1 six-inch tortilla.  3 oz (85 g) cooked rice or pasta.  4 oz (113 g) cooked dried beans.  4 oz (113 g) starchy vegetable,  such as peas, corn, or potatoes.  4 oz (113 g) hot cereal.  4 oz (113 g) mashed potatoes or  of a large baked potato.  4 oz (113 g) canned or frozen fruit.  4 oz (120 mL) fruit juice.  4-6 crackers.  6 chicken nuggets.  6 oz (170 g) unsweetened dry cereal.  6 oz (170 g) plain fat-free yogurt or yogurt sweetened with artificial sweeteners.  8 oz (240 mL) milk.  8 oz (170 g) fresh fruit or one small piece of fruit.  24 oz  (680 g) popped popcorn.  Example of carbohydrate counting Sample meal  3 oz (85 g) chicken breast.  6 oz (170 g) brown rice.  4 oz (113 g) corn.  8 oz (240 mL) milk.  8 oz (170 g) strawberries with sugar-free whipped topping. Carbohydrate calculation 1. Identify the foods that contain carbohydrates: ? Rice. ? Corn. ? Milk. ? Strawberries. 2. Calculate how many servings you have of each food: ? 2 servings rice. ? 1 serving corn. ? 1 serving milk. ? 1 serving strawberries. 3. Multiply each number of servings by 15 g: ? 2 servings rice x 15 g = 30 g. ? 1 serving corn x 15 g = 15 g. ? 1 serving milk x 15 g = 15 g. ? 1 serving strawberries x 15 g = 15 g. 4. Add together all of the amounts to find the total grams of carbohydrates eaten: ? 30 g + 15 g + 15 g + 15 g = 75 g of carbohydrates total. This information is not intended to replace advice given to you by your health care provider. Make sure you discuss any questions you have with your health care provider. Document Released: 01/07/2005 Document Revised: 07/28/2015 Document Reviewed: 06/21/2015 Elsevier Interactive Patient Education  [employer].

## 2016-11-12 ENCOUNTER — Encounter: Payer: Self-pay | Admitting: Internal Medicine

## 2016-11-12 DIAGNOSIS — Z0000 Encounter for general adult medical examination without abnormal findings: Secondary | ICD-10-CM | POA: Insufficient documentation

## 2016-11-12 LAB — LIPID PANEL
CHOL/HDL RATIO: 4.5 ratio — AB (ref 0.0–4.4)
Cholesterol, Total: 213 mg/dL — ABNORMAL HIGH (ref 100–199)
HDL: 47 mg/dL (ref >39)
LDL Calculated: 135 mg/dL — ABNORMAL HIGH (ref 0–99)
TRIGLYCERIDES: 155 mg/dL — AB (ref 0–149)
VLDL Cholesterol Cal: 31 mg/dL (ref 5–40)

## 2016-11-12 LAB — BMP8+ANION GAP
ANION GAP: 16 mmol/L (ref 10.0–18.0)
BUN/Creatinine Ratio: 13 (ref 9–23)
BUN: 9 mg/dL (ref 6–24)
CHLORIDE: 95 mmol/L — AB (ref 96–106)
CO2: 22 mmol/L (ref 20–29)
Calcium: 9.7 mg/dL (ref 8.7–10.2)
Creatinine, Ser: 0.69 mg/dL (ref 0.57–1.00)
GFR, EST AFRICAN AMERICAN: 117 mL/min/{1.73_m2} (ref >59)
GFR, EST NON AFRICAN AMERICAN: 102 mL/min/{1.73_m2} (ref >59)
Glucose: 92 mg/dL (ref 65–99)
POTASSIUM: 4.8 mmol/L (ref 3.5–5.2)
Sodium: 133 mmol/L — ABNORMAL LOW (ref 134–144)

## 2016-11-12 NOTE — Assessment & Plan Note (Addendum)
The patient is currently on metformin 1000mg  bid. Her last a1c was 6.4 in May 2018. Her a1c during this visit was 6.7. The patient has gained 8lbs since his last visit in June 2018.  The patient's blood glucose has ranged 65-120. She has been noting a few episodes of hypoglycemia but has not been symptomatic. The patient's symptoms are likely being masked by carvedilol which she is on.  The patient states that she recorded a blood glucose measurement of 67 two days ago. She had eaten corn flakes with banana, coffee, and a piece of toast. She subsequently walked to and from church and rechecked her blood glucose 2.5-3hrs later.   -Spoke about lifestyle modification -Continue metformin 1000mg  bid -Requested patient to record blood glucose measurements every morning for 2 weeks prior to eating anything. I also requested the patient to record blood glucose measurement randomly during the day for 2 weeks.

## 2016-11-12 NOTE — Assessment & Plan Note (Signed)
Flu shot given

## 2016-11-12 NOTE — Assessment & Plan Note (Addendum)
BMP ordered: the patient's na=133 from previous 128. The patient recommended to continue taking salt tablets and limit excessive fluid intake.

## 2016-11-12 NOTE — Assessment & Plan Note (Addendum)
Lipid panel was last done May 2015. She is being prescribed atorvastatin 10mg  qd.  -lipid panel ordered  ADDENDUM:  The patient's current ascvd risk is 17.4% and lifetime risk is 50%   -Will increase atorvastatin 10mg  qd to atorvastatin 40mg  qd -Called patient and informed her that a new script sent

## 2016-11-12 NOTE — Assessment & Plan Note (Signed)
The patient's blood pressure during this visit was 158/90. Repeat measurement was 120/82.  The patient is currently on amlodipine 10mg  qd, lisinopril 40mg , and carvedilol 6.25mg  bid.  -Continue amlodipine 10mg  qd, lisinopril 40mg , and carvedilol 6.25mg  bid

## 2016-11-13 NOTE — Progress Notes (Signed)
Internal Medicine Clinic Attending  I saw and evaluated the patient.  I personally confirmed the key portions of the history and exam documented by Dr. Chundi and I reviewed pertinent patient test results.  The assessment, diagnosis, and plan were formulated together and I agree with the documentation in the resident's note. 

## 2016-11-14 MED ORDER — ATORVASTATIN CALCIUM 40 MG PO TABS: 40.0000 mg | ORAL_TABLET | Freq: Every day | ORAL | 2 refills | Status: DC

## 2016-11-14 MED ORDER — ATORVASTATIN CALCIUM 10 MG PO TABS: 40.0000 mg | ORAL_TABLET | Freq: Every day | ORAL | 2 refills | Status: DC

## 2016-11-14 NOTE — Addendum Note (Signed)
Addended by: Lorenso CourierHUNDI, Kinze Labo on: 11/14/2016 08:47 AM   Modules accepted: Orders

## 2016-11-20 NOTE — Telephone Encounter (Signed)
Was seen 10/22, resolved

## 2016-11-25 ENCOUNTER — Ambulatory Visit: Payer: Self-pay

## 2016-12-02 ENCOUNTER — Ambulatory Visit: Admitting: Internal Medicine

## 2016-12-02 ENCOUNTER — Other Ambulatory Visit: Payer: Self-pay

## 2016-12-02 ENCOUNTER — Encounter: Payer: Self-pay | Admitting: Internal Medicine

## 2016-12-02 DIAGNOSIS — Z79899 Other long term (current) drug therapy: Secondary | ICD-10-CM | POA: Diagnosis not present

## 2016-12-02 DIAGNOSIS — I10 Essential (primary) hypertension: Secondary | ICD-10-CM

## 2016-12-02 DIAGNOSIS — E119 Type 2 diabetes mellitus without complications: Secondary | ICD-10-CM | POA: Diagnosis not present

## 2016-12-02 DIAGNOSIS — R809 Proteinuria, unspecified: Secondary | ICD-10-CM | POA: Diagnosis not present

## 2016-12-02 DIAGNOSIS — E7800 Pure hypercholesterolemia, unspecified: Secondary | ICD-10-CM

## 2016-12-02 DIAGNOSIS — E1129 Type 2 diabetes mellitus with other diabetic kidney complication: Secondary | ICD-10-CM

## 2016-12-02 LAB — GLUCOSE, CAPILLARY: GLUCOSE-CAPILLARY: 82 mg/dL (ref 65–99)

## 2016-12-02 NOTE — Progress Notes (Signed)
Internal Medicine Clinic Attending  Case discussed with Dr. Boswell at the time of the visit.  We reviewed the resident's history and exam and pertinent patient test results.  I agree with the assessment, diagnosis, and plan of care documented in the resident's note.  

## 2016-12-02 NOTE — Patient Instructions (Signed)
[name],   I am glad you are doing well. I think we can stop checking your blood sugars every day and just check when you are feeling symptomatic. Please follow up with Dr. Vincente LibertyMolt in the next 2-3 months.

## 2016-12-02 NOTE — Assessment & Plan Note (Addendum)
BP Readings from Last 3 Encounters:  12/02/16 (!) 158/90  11/11/16 (!) 158/90  06/21/16 129/71    Lab Results  Component Value Date   NA 133 (L) 11/11/2016   K 4.8 11/11/2016   CREATININE 0.69 11/11/2016   BP initially elevated at 158/90 today.   Currently prescribed amlodipine 10 mg daily, lisinopril 40 mg daily, and carvedilol 6.25 mg bid. Reports compliance. Denies any symptoms today.   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    A/P: Continue current medications. If remains elevated will need to consider additional agents.

## 2016-12-02 NOTE — Assessment & Plan Note (Addendum)
Lab Results  Component Value Date   HGBA1C 6.7 11/11/2016   HGBA1C 6.4 05/21/2016   HGBA1C 6.4 02/13/2016    Most recent A1c last month was 6.7. She is currently prescribed metformin 1000 mg bid. When seen last month she noted having a few episodes of hypoglycemia when checking her CBG (ranged 65-120 on meter) but was asymptomatic. Provider was concerned that her BB may have been masking her symptoms. She was asked to check her fasting CBGs and return to clinic for re-check.   Today, she reports she is doing well. Has been checking her CBGs at home as asked. Range is 66-139. She reports no instances of symptomatic hypoglycemia. Does reports he has been working on improving her diet and has started exercising again. She has lost 8 lbs since last visit (intentionally). Reports she has quit drinking ETOH.   A/P: Patient is doing well. DM is well controlled only on metformin. Do not see an indication to continue to check CBGs at home unless symptomatic especially now that she reports ETOH cessation. Will d/c home CBG checks and only check prn. Continue current medications.

## 2016-12-02 NOTE — Progress Notes (Signed)
   CC: DM follow up  HPI:  [name] is a 51 y.o. female with a past medical history listed below here today for follow up of her DM.  For details of today's visit and the status of her chronic medical issues please refer to the assessment and plan.   Past Medical History:  Diagnosis Date  . Anemia    when in late teens  . Anxiety   . Breast abscess   . Chlamydia   . Depression    PTSD also  . Diabetes mellitus without complication (HCC)   . Gonorrhea   . High cholesterol   . Hypercholesteremia   . Hypertension   . Recurrent upper respiratory infection (URI)    URI/SOB-treated with antibiotics-cleared up now  . Seizures (HCC)   . Substance abuse (HCC)    alcohol  . Tuberculosis    was treated   Review of Systems:   No chest pain or shortness of breath  Physical Exam:  Vitals:   12/02/16 0929  BP: (!) 158/90  Pulse: (!) 58  Temp: 97.8 F (36.6 C)  TempSrc: Oral  SpO2: 98%  Weight: 170 lb (77.1 kg)  Height: 5\' 3"  (1.6 m)   Physical Exam  Constitutional: She is oriented to person, place, and time and well-developed, well-nourished, and in no distress. No distress.  HENT:  Head: Normocephalic and atraumatic.  Cardiovascular: Normal rate, regular rhythm and normal heart sounds.  Pulmonary/Chest: Effort normal and breath sounds normal.  Abdominal: Soft. Bowel sounds are normal.  Musculoskeletal: Normal range of motion.  Neurological: She is alert and oriented to person, place, and time.  Skin: Skin is warm and dry.  Psychiatric: Mood and affect normal.  Vitals reviewed.    Assessment & Plan:   See Encounters Tab for problem based charting.  Patient discussed with Dr. Cleda DaubE. Hoffman

## 2016-12-09 ENCOUNTER — Telehealth: Payer: Self-pay | Admitting: Internal Medicine

## 2016-12-09 NOTE — Telephone Encounter (Signed)
HAVING SEVERE BLADDER PAIN, WANTS PAIN MEDICATION

## 2016-12-09 NOTE — Telephone Encounter (Signed)
appt 11/20 [facility], wants pain med for bladder pain

## 2016-12-10 ENCOUNTER — Ambulatory Visit (INDEPENDENT_AMBULATORY_CARE_PROVIDER_SITE_OTHER): Admitting: Internal Medicine

## 2016-12-10 ENCOUNTER — Other Ambulatory Visit: Payer: Self-pay

## 2016-12-10 ENCOUNTER — Encounter: Payer: Self-pay | Admitting: Internal Medicine

## 2016-12-10 VITALS — BP 133/87 | HR 72 | Temp 98.2°F | Ht 63.0 in | Wt 172.5 lb

## 2016-12-10 DIAGNOSIS — R300 Dysuria: Secondary | ICD-10-CM | POA: Diagnosis not present

## 2016-12-10 DIAGNOSIS — R1030 Lower abdominal pain, unspecified: Secondary | ICD-10-CM | POA: Diagnosis not present

## 2016-12-10 MED ORDER — PHENAZOPYRIDINE HCL 97.5 MG PO TABS: 1.0000 | ORAL_TABLET | Freq: Three times a day (TID) | ORAL | 2 refills | Status: DC | PRN

## 2016-12-10 NOTE — Progress Notes (Signed)
   CC: Bladder pain  HPI:  [name] is a 51 y.o. female with PMHx detailed below presenting with lower abdominal pain that started yesterday but resolved by this morning.  See problem based assessment and plan below for additional details.  Dysuria She had recurrence of previous bladder pain yesterday but it improved after taking phenazopyridine for one day. She has been taking hydroxyzine at night occasionally and started this again after symptoms came back. She reports pain and dysuria but that symptoms improve after she urinates. She reports taking one blister pack of 6 tablets over the past 3 months. There is no visible blood and dipstick test earlier this year showed only trace leukocytes. Assessment Intermittent dysuria without blood or difficulty voiding. I do not see a strong indication for testing or urine studies with her having no symptoms today. Interstitial cystitis typically would not require specialty referral for workup unless it failed to respond to initial symptomatic treatment. Plan Continue phenazopyridine PRN for recurrence of dysuria Hydroxyzine 25mg  qHS as needed for bladder pain or spasm RTC if sees blood in urine, pain refractory to symptom treatments    Past Medical History:  Diagnosis Date  . Anemia    when in late teens  . Anxiety   . Breast abscess   . Chlamydia   . Depression    PTSD also  . Diabetes mellitus without complication (HCC)   . Gonorrhea   . High cholesterol   . Hypercholesteremia   . Hypertension   . Recurrent upper respiratory infection (URI)    URI/SOB-treated with antibiotics-cleared up now  . Seizures (HCC)   . Substance abuse (HCC)    alcohol  . Tuberculosis    was treated    Review of Systems: Review of Systems  Constitutional: Negative for fever.  Gastrointestinal: Positive for abdominal pain. Negative for blood in stool.  Genitourinary: Negative for dysuria, flank pain, frequency and hematuria.  Skin:  Negative for itching.     Physical Exam: Vitals:   12/10/16 0832  BP: 133/87  Pulse: 72  Temp: 98.2 F (36.8 C)  TempSrc: Oral  SpO2: 98%  Weight: 172 lb 8 oz (78.2 kg)  Height: 5\' 3"  (1.6 m)   GENERAL- alert, co-operative, NAD CARDIAC- RRR, no murmurs, rubs or gallops. RESP- CTAB, no wheezes or crackles. ABDOMEN- Soft, nontender, no guarding or rebound, normoactive bowel sounds present BACK- No CVA tenderness. EXTREMITIES- No pedal edema. SKIN- Warm, dry, No rash or lesion.    Assessment & Plan:   See encounters tab for problem based medical decision making.   Patient discussed with Dr. Cleda DaubE. Hoffman

## 2016-12-10 NOTE — Patient Instructions (Signed)
It was a pleasure to see you today [name].  I suspect your bladder pain from irritation making it prone to spasms. Usually this problem does not progress into anything serious or dangerous. We can treat this symptomatically with hydroxyzine at night and you can take phenazopyridine 3 times daily as needed during symptoms, up to 2 days.  We would need to know if you noticed changes such as blood in your urine or difficulty emptying your bladder, as these would be concerning to need more testing. Also if symptoms no longer improve with current treatments let us know.

## 2016-12-10 NOTE — Assessment & Plan Note (Signed)
She had recurrence of previous bladder pain yesterday but it improved after taking phenazopyridine for one day. She has been taking hydroxyzine at night occasionally and started this again after symptoms came back. She reports pain and dysuria but that symptoms improve after she urinates. She reports taking one blister pack of 6 tablets over the past 3 months. There is no visible blood and dipstick test earlier this year showed only trace leukocytes. Assessment Intermittent dysuria without blood or difficulty voiding. I do not see a strong indication for testing or urine studies with her having no symptoms today. Interstitial cystitis typically would not require specialty referral for workup unless it failed to respond to initial symptomatic treatment. Plan Continue phenazopyridine PRN for recurrence of dysuria Hydroxyzine 25mg  qHS as needed for bladder pain or spasm RTC if sees blood in urine, pain refractory to symptom treatments

## 2016-12-11 NOTE — Progress Notes (Signed)
Internal Medicine Clinic Attending  Case discussed with Dr. Rice at the time of the visit.  We reviewed the resident's history and exam and pertinent patient test results.  I agree with the assessment, diagnosis, and plan of care documented in the resident's note.  

## 2016-12-15 IMAGING — DX DG CHEST 2V
2 series · 2 of 2 positions shown · non-contrast
Comparison: 09/08/2013

CLINICAL DATA: Mid sternal chest pain starting today

EXAM:
CHEST  2 VIEW

[w chest pa]
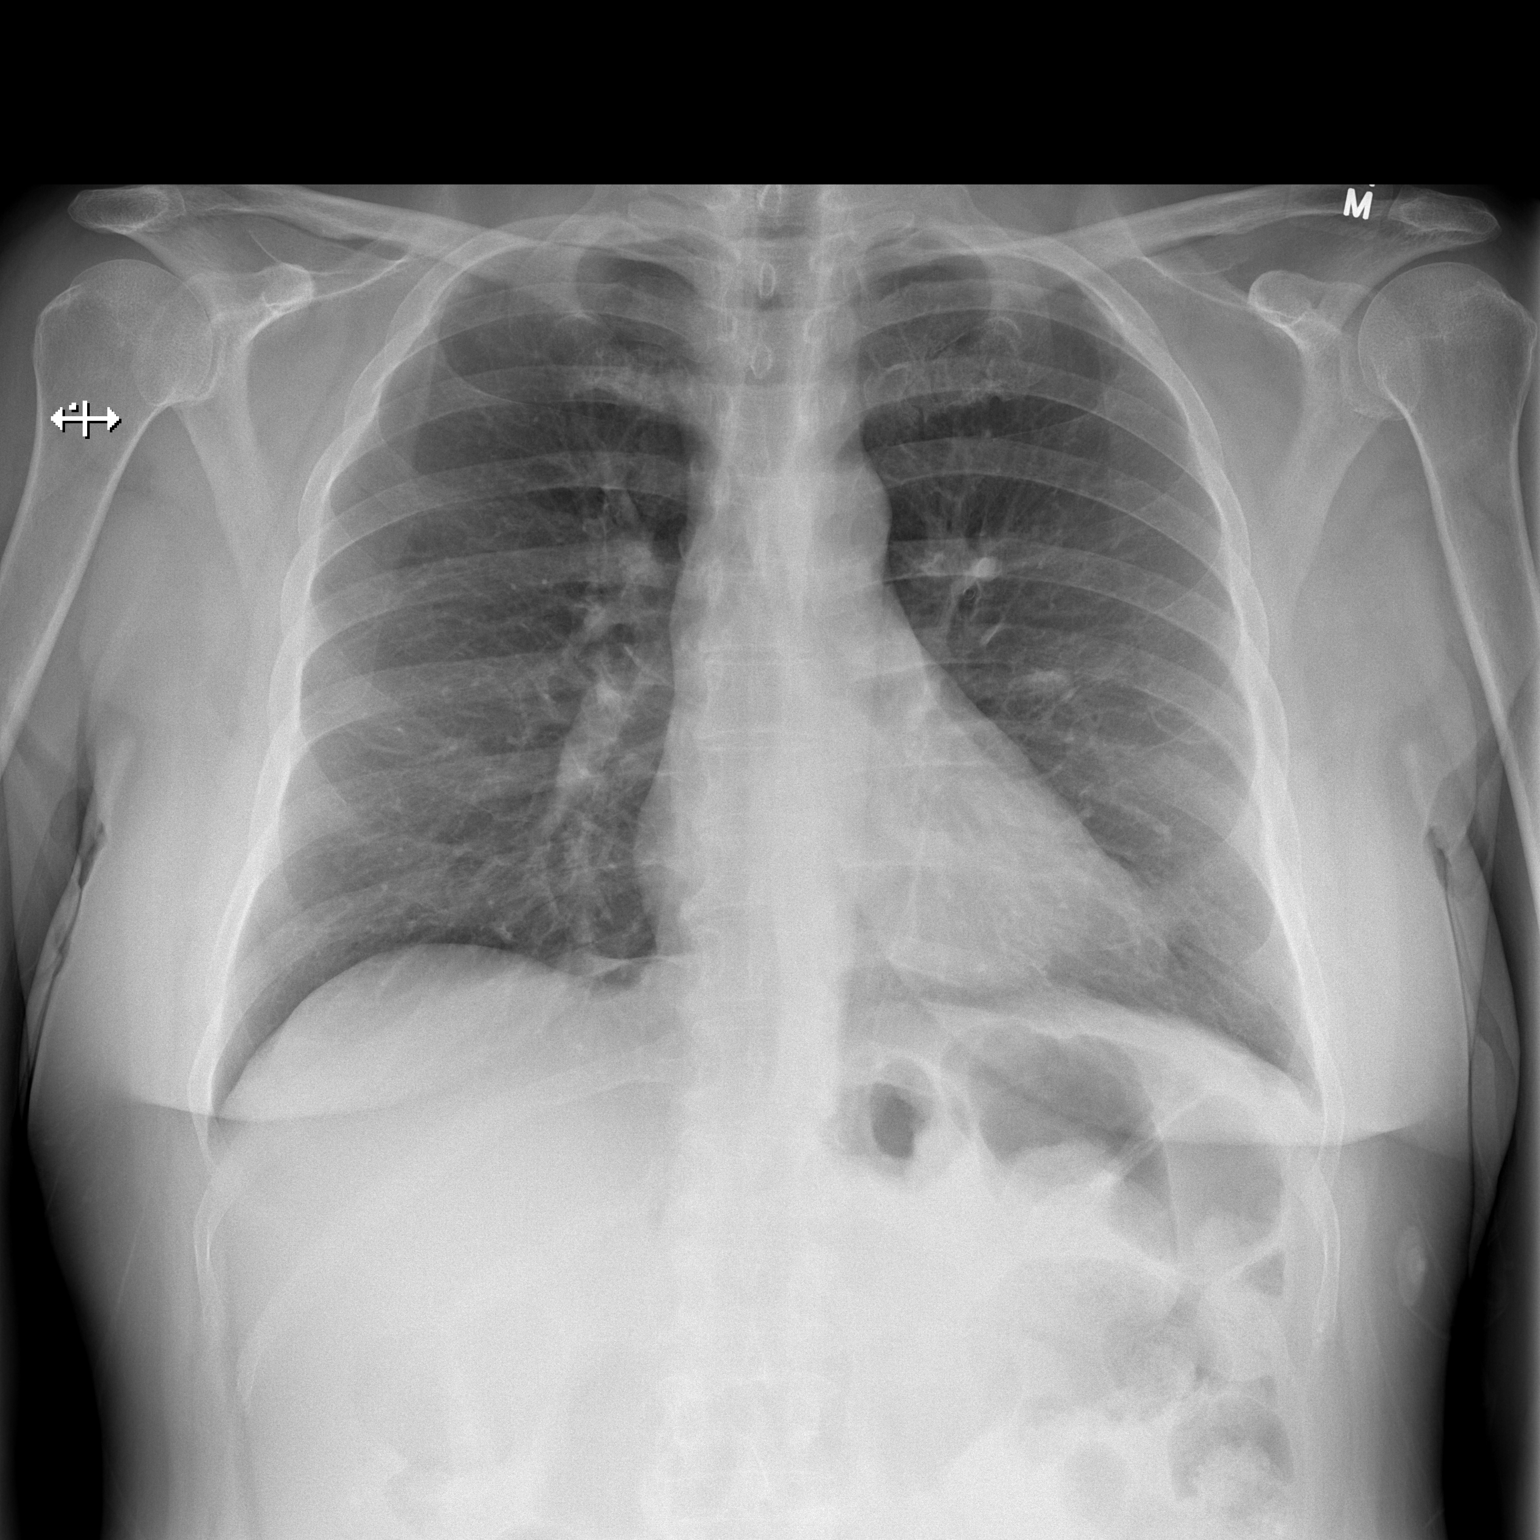

[w chest lat]
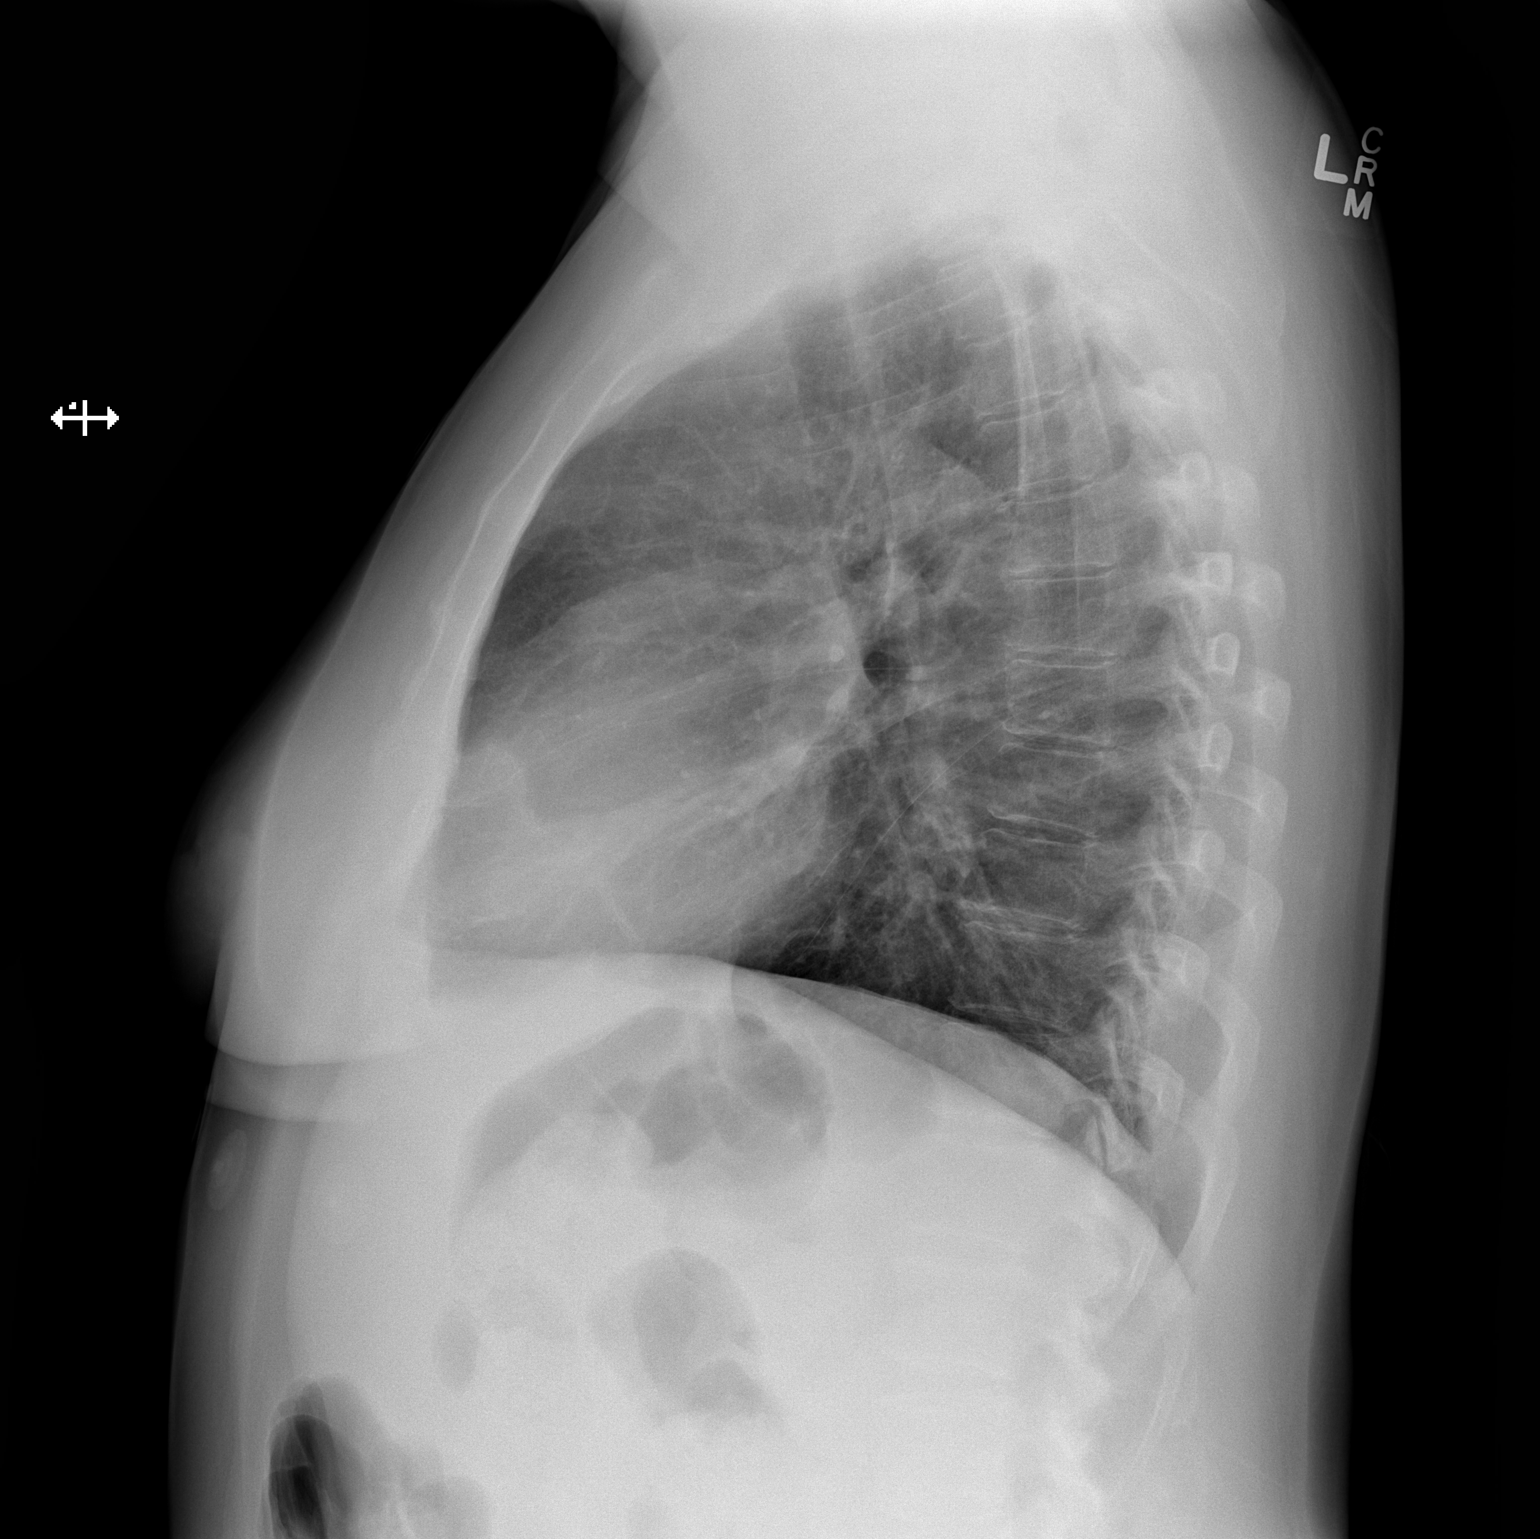

[2 of 2 positions shown; findings below may reference images not displayed]

FINDINGS: Cardiomediastinal silhouette is stable. No acute infiltrate or
pleural effusion. No pulmonary edema. Bony thorax is unremarkable.
IMPRESSION: No active cardiopulmonary disease.

## 2017-02-07 ENCOUNTER — Other Ambulatory Visit: Payer: Self-pay

## 2017-02-07 DIAGNOSIS — R300 Dysuria: Secondary | ICD-10-CM

## 2017-02-07 NOTE — Telephone Encounter (Signed)
hydrOXYzine (VISTARIL) 25 MG capsule   Refill request @ [facility] on [address].

## 2017-02-08 MED ORDER — HYDROXYZINE PAMOATE 25 MG PO CAPS: 25.0000 mg | ORAL_CAPSULE | Freq: Every evening | ORAL | 1 refills | Status: DC | PRN

## 2017-02-17 ENCOUNTER — Other Ambulatory Visit: Payer: Self-pay | Admitting: Internal Medicine

## 2017-02-17 DIAGNOSIS — I10 Essential (primary) hypertension: Secondary | ICD-10-CM

## 2017-02-17 MED ORDER — CARVEDILOL 6.25 MG PO TABS: 6.2500 mg | ORAL_TABLET | Freq: Two times a day (BID) | ORAL | 1 refills | Status: DC

## 2017-02-17 NOTE — Telephone Encounter (Signed)
Patient is requesting a refill on Carvedilol 6.25 mg, please call patient

## 2017-02-21 ENCOUNTER — Other Ambulatory Visit: Payer: Self-pay

## 2017-02-21 NOTE — Telephone Encounter (Signed)
atorvastatin (LIPITOR) 40 MG tablet(Expired), refill request @ [facility] on [address].

## 2017-02-24 MED ORDER — ATORVASTATIN CALCIUM 40 MG PO TABS: 40.0000 mg | ORAL_TABLET | Freq: Every day | ORAL | 1 refills | Status: DC

## 2017-02-24 NOTE — Telephone Encounter (Signed)
atorvastatin (LIPITOR) 40 MG tablet(Expired),   hydrOXYzine (VISTARIL) 25 MG capsule   Refill request @ [facility] on [address].

## 2017-02-25 ENCOUNTER — Other Ambulatory Visit: Payer: Self-pay

## 2017-02-25 NOTE — Telephone Encounter (Signed)
At[facility]vastatin was refilled yesterday. [facility] Called [facility] - stated it has been filled and waiting on her to pick it up. Called pt back - informed it's ready.

## 2017-02-25 NOTE — Telephone Encounter (Signed)
Pt states the pharmacy is still waiting for the office to reply back on   atorvastatin (LIPITOR) 40 MG tablet   Please call pt back.

## 2017-02-26 ENCOUNTER — Other Ambulatory Visit: Payer: Self-pay | Admitting: Internal Medicine

## 2017-02-26 MED ORDER — GABAPENTIN 300 MG PO CAPS: 300.0000 mg | ORAL_CAPSULE | Freq: Three times a day (TID) | ORAL | 5 refills | Status: DC

## 2017-02-26 NOTE — Telephone Encounter (Signed)
Patient is requesting refills gabapentin

## 2017-03-04 ENCOUNTER — Other Ambulatory Visit: Payer: Self-pay | Admitting: Internal Medicine

## 2017-03-04 DIAGNOSIS — I10 Essential (primary) hypertension: Secondary | ICD-10-CM

## 2017-03-04 MED ORDER — LISINOPRIL 40 MG PO TABS: 40.0000 mg | ORAL_TABLET | Freq: Every day | ORAL | 2 refills | Status: DC

## 2017-03-04 NOTE — Telephone Encounter (Signed)
Patient requesting a refill on lisinopril, [facility] [address]

## 2017-03-25 ENCOUNTER — Encounter: Payer: Self-pay | Admitting: Internal Medicine

## 2017-04-08 ENCOUNTER — Encounter: Payer: Self-pay | Admitting: Internal Medicine

## 2017-04-08 NOTE — Progress Notes (Deleted)
asdf

## 2017-04-14 ENCOUNTER — Other Ambulatory Visit: Payer: Self-pay | Admitting: Internal Medicine

## 2017-04-14 DIAGNOSIS — Z1231 Encounter for screening mammogram for malignant neoplasm of breast: Secondary | ICD-10-CM

## 2017-04-22 ENCOUNTER — Other Ambulatory Visit: Payer: Self-pay | Admitting: Internal Medicine

## 2017-04-22 DIAGNOSIS — I10 Essential (primary) hypertension: Secondary | ICD-10-CM

## 2017-04-22 MED ORDER — CARVEDILOL 6.25 MG PO TABS: 6.2500 mg | ORAL_TABLET | Freq: Two times a day (BID) | ORAL | 1 refills | Status: DC

## 2017-04-22 NOTE — Telephone Encounter (Signed)
Patient is requesting a refill carvedilol 6.25mg , [facility] [address]

## 2017-04-22 NOTE — Telephone Encounter (Signed)
Hello! Would like patient to be seen within the next 1-2 months here in clinic to follow-up her chronic medical conditions and check BP.  Thanks!

## 2017-04-24 NOTE — Telephone Encounter (Signed)
Patient has appt 05/27/2017. Kinnie FeilL. Maykel Reitter, RN, BSN

## 2017-04-28 ENCOUNTER — Other Ambulatory Visit: Payer: Self-pay

## 2017-04-28 DIAGNOSIS — R809 Proteinuria, unspecified: Principal | ICD-10-CM

## 2017-04-28 DIAGNOSIS — E1129 Type 2 diabetes mellitus with other diabetic kidney complication: Secondary | ICD-10-CM

## 2017-04-28 MED ORDER — METFORMIN HCL 1000 MG PO TABS: 1000.0000 mg | ORAL_TABLET | Freq: Two times a day (BID) | ORAL | 3 refills | Status: DC

## 2017-04-28 NOTE — Telephone Encounter (Signed)
metFORMIN (GLUCOPHAGE) 1000 MG tablet, refill request @ [facility] on [address].

## 2017-05-23 ENCOUNTER — Ambulatory Visit
Admission: RE | Admit: 2017-05-23 | Discharge: 2017-05-23 | Disposition: A | Discharge status: Home / Self Care | Source: Ambulatory Visit | Attending: *Deleted | Admitting: *Deleted

## 2017-05-23 DIAGNOSIS — Z1231 Encounter for screening mammogram for malignant neoplasm of breast: Secondary | ICD-10-CM

## 2017-05-27 ENCOUNTER — Encounter: Payer: Self-pay | Admitting: Internal Medicine

## 2017-05-30 ENCOUNTER — Emergency Department (HOSPITAL_COMMUNITY)

## 2017-05-30 ENCOUNTER — Encounter (HOSPITAL_COMMUNITY): Payer: Self-pay | Admitting: Emergency Medicine

## 2017-05-30 ENCOUNTER — Emergency Department (HOSPITAL_COMMUNITY)
Admission: EM | Admit: 2017-05-30 | Discharge: 2017-05-31 | Disposition: A | Discharge status: Home / Self Care | Attending: Emergency Medicine | Admitting: Emergency Medicine

## 2017-05-30 ENCOUNTER — Other Ambulatory Visit: Payer: Self-pay

## 2017-05-30 DIAGNOSIS — F17210 Nicotine dependence, cigarettes, uncomplicated: Secondary | ICD-10-CM | POA: Insufficient documentation

## 2017-05-30 DIAGNOSIS — Z79899 Other long term (current) drug therapy: Secondary | ICD-10-CM | POA: Diagnosis not present

## 2017-05-30 DIAGNOSIS — R05 Cough: Secondary | ICD-10-CM

## 2017-05-30 DIAGNOSIS — H66002 Acute suppurative otitis media without spontaneous rupture of ear drum, left ear: Secondary | ICD-10-CM | POA: Insufficient documentation

## 2017-05-30 DIAGNOSIS — Z7984 Long term (current) use of oral hypoglycemic drugs: Secondary | ICD-10-CM | POA: Diagnosis not present

## 2017-05-30 DIAGNOSIS — E119 Type 2 diabetes mellitus without complications: Secondary | ICD-10-CM | POA: Diagnosis not present

## 2017-05-30 DIAGNOSIS — R059 Cough, unspecified: Secondary | ICD-10-CM

## 2017-05-30 DIAGNOSIS — I10 Essential (primary) hypertension: Secondary | ICD-10-CM | POA: Insufficient documentation

## 2017-05-30 LAB — CBC
HEMATOCRIT: 36.8 % (ref 36.0–46.0)
HEMOGLOBIN: 12.4 g/dL (ref 12.0–15.0)
MCH: 28.9 pg (ref 26.0–34.0)
MCHC: 33.7 g/dL (ref 30.0–36.0)
MCV: 85.8 fL (ref 78.0–100.0)
Platelets: 302 10*3/uL (ref 150–400)
RBC: 4.29 MIL/uL (ref 3.87–5.11)
RDW: 14.3 % (ref 11.5–15.5)
WBC: 10.1 10*3/uL (ref 4.0–10.5)

## 2017-05-30 LAB — BASIC METABOLIC PANEL
ANION GAP: 9 (ref 5–15)
BUN: 9 mg/dL (ref 6–20)
CHLORIDE: 95 mmol/L — AB (ref 101–111)
CO2: 28 mmol/L (ref 22–32)
Calcium: 9.7 mg/dL (ref 8.9–10.3)
Creatinine, Ser: 0.82 mg/dL (ref 0.44–1.00)
GFR calc non Af Amer: >60 mL/min (ref >60)
GLUCOSE: 139 mg/dL — AB (ref 65–99)
POTASSIUM: 4.5 mmol/L (ref 3.5–5.1)
Sodium: 132 mmol/L — ABNORMAL LOW (ref 135–145)

## 2017-05-30 LAB — I-STAT TROPONIN, ED: Troponin i, poc: 0 ng/mL (ref 0.00–0.08)

## 2017-05-30 NOTE — ED Triage Notes (Signed)
C/o productive cough with clear sputum x 1 week.  Reports pain across chest when she coughs.  C/o difficulty breathing when she lays down on her back.

## 2017-05-31 LAB — CBG MONITORING, ED: Glucose-Capillary: 121 mg/dL — ABNORMAL HIGH (ref 65–99)

## 2017-05-31 MED ORDER — CETIRIZINE HCL 10 MG PO TABS: 10.0000 mg | ORAL_TABLET | Freq: Every day | ORAL | 0 refills | Status: DC

## 2017-05-31 MED ORDER — AMOXICILLIN 500 MG PO CAPS: 500.0000 mg | ORAL_CAPSULE | Freq: Two times a day (BID) | ORAL | 0 refills | Status: DC

## 2017-05-31 MED ORDER — BENZONATATE 100 MG PO CAPS: 100.0000 mg | ORAL_CAPSULE | Freq: Three times a day (TID) | ORAL | 0 refills | Status: DC

## 2017-05-31 NOTE — ED Notes (Signed)
Checked patient blood sugar patient stated that she was hungry and have a history of sugar it was 121 notified RN of blood sugar

## 2017-05-31 NOTE — Discharge Instructions (Signed)
Medications: Amoxicillin, Zyrtec, Tessalon  Treatment: Take amoxicillin twice daily for 1 week for your suspected ear infection.  Take Zyrtec 1-2 times daily for your allergies.  Take Tessalon every 8 hours as needed for cough.  You can continue using your albuterol inhaler every 4-6 hours as needed for excessive cough, shortness of breath, wheezing.  Follow-up: Please follow-up with your doctor in 2 to 3 days for recheck and further evaluation.  Please return to emergency department if you develop any new or worsening symptoms

## 2017-05-31 NOTE — ED Provider Notes (Signed)
[facility] EMERGENCY DEPARTMENT Provider Note   CSN: [id number] Arrival date & time: 05/30/17  2135     History   Chief Complaint Chief Complaint  Patient presents with  . Cough    HPI [name] is a 52 y.o. female with history of HTN, DM, recurrent URI who presents with a 2-week history of cough, sneezing, nasal congestion.  Patient has had associated left ear pain.   Patient has had associated chest pain only with coughing.  Her cough is worse at night and with lying down.  She denies shortness of breath.  She denies pleuritic pain. Patient is taking Mucinex and albuterol inhaler at home without relief.  She denies any fevers, chills, abdominal pain, nausea, vomiting, no urinary symptoms.  She denies any recent long trips, surgeries, new leg pain or swelling, history of blood clots, exogenous estrogen use.  HPI  Past Medical History:  Diagnosis Date  . Anemia    when in late teens  . Anxiety   . Breast abscess   . Chlamydia   . Depression    PTSD also  . Diabetes mellitus without complication (HCC)   . Gonorrhea   . High cholesterol   . Hypercholesteremia   . Hypertension   . Recurrent upper respiratory infection (URI)    URI/SOB-treated with antibiotics-cleared up now  . Seizures (HCC)   . Substance abuse (HCC)    alcohol  . Tuberculosis    was treated    Patient Active Problem List   Diagnosis Date Noted  . Healthcare maintenance 11/12/2016  . Occipital neuralgia of left side 06/21/2016  . Dysuria 05/21/2016  . Avascular necrosis (HCC) 03/01/2016  . Chronic hyponatremia 08/29/2015  . Strain of left hip adductor muscle 05/30/2015  . Tobacco use disorder 04/12/2015  . Abnormal brain MRI 03/10/2015  . Elevated liver enzymes 04/14/2013  . Diabetic neuropathy, painful (HCC) 04/14/2013  . Alcohol dependence (HCC) 03/23/2013  . Substance induced mood disorder (HCC) 03/22/2013  . Depression 02/05/2012  . Hyperlipidemia 02/05/2012    . Hypertension 01/07/2012  . Type 2 diabetes mellitus with microalbuminuria, without long-term current use of insulin (HCC) 01/07/2012    Past Surgical History:  Procedure Laterality Date  . BREAST BIOPSY  11/13/2010  . BREAST LUMPECTOMY    . INCISE AND DRAIN ABCESS  01/03/11   breast abscess  . TONGUE SURGERY  2008   lump removed     OB History    Gravida  2   Para  2   Term  2   Preterm      AB      Living  2     SAB      TAB      Ectopic      Multiple      Live Births               Home Medications    Prior to Admission medications   Medication Sig Start Date End Date Taking? Authorizing Provider  albuterol (PROVENTIL HFA;VENTOLIN HFA) 108 (90 Base) MCG/ACT inhaler Inhale 2 puffs into the lungs every 6 (six) hours as needed for wheezing or shortness of breath. 04/12/15   Jana Half, MD  amLODipine (NORVASC) 10 MG tablet Take 1 tablet (10 mg total) by mouth daily. 08/29/15   Burns, Tinnie Gens, MD  amoxicillin (AMOXIL) 500 MG capsule Take 1 capsule (500 mg total) by mouth 2 (two) times daily. 05/31/17   Emi Holes,  PA-C  atorvastatin (LIPITOR) 40 MG tablet Take 1 tablet (40 mg total) by mouth daily. IM Program. 02/24/17 03/26/17  Molt, Bethany, DO  benzonatate (TESSALON) 100 MG capsule Take 1 capsule (100 mg total) by mouth every 8 (eight) hours. 05/31/17   Kamira Mellette, Waylan Boga, PA-C  carvedilol (COREG) 6.25 MG tablet Take 1 tablet (6.25 mg total) by mouth 2 (two) times daily. 04/22/17 04/22/18  Molt, Bethany, DO  cetirizine (ZYRTEC ALLERGY) 10 MG tablet Take 1 tablet (10 mg total) by mouth daily. 05/31/17   Jennett Tarbell, Waylan Boga, PA-C  gabapentin (NEURONTIN) 300 MG capsule Take 1 capsule (300 mg total) by mouth 3 (three) times daily. 02/26/17   Molt, Bethany, DO  hydrOXYzine (VISTARIL) 25 MG capsule Take 1 capsule (25 mg total) by mouth at bedtime as needed for itching or anxiety (bladder pain). 02/08/17   Molt, Bethany, DO  ibuprofen (ADVIL,MOTRIN) 400 MG tablet Take 1  tablet (400 mg total) by mouth every 6 (six) hours as needed. 06/04/16   Raeford Razor, MD  lisinopril (PRINIVIL,ZESTRIL) 40 MG tablet Take 1 tablet (40 mg total) by mouth daily. 03/04/17   Molt, Bethany, DO  metFORMIN (GLUCOPHAGE) 1000 MG tablet Take 1 tablet (1,000 mg total) by mouth 2 (two) times daily with a meal. 04/28/17   Molt, Bethany, DO  naproxen sodium (ALEVE) 220 MG tablet Take 220-440 mg by mouth daily as needed (FOR LEG PAIN).    [provider]  Phenazopyridine HCl 97.5 MG TABS Take 1 tablet (97.5 mg total) 3 (three) times daily as needed by mouth (For bladder pain). For up to 2 days consecutively 12/10/16   Fuller Plan, MD  sodium chloride 1 g tablet Take 1 tablet (1 g total) by mouth 2 (two) times daily with a meal. 02/15/16   Denton Brick, MD  trolamine salicylate (ASPERCREME) 10 % cream Apply 1 application topically daily as needed for muscle pain (and leg pain).     [provider]    Family History Family History  Problem Relation Age of Onset  . Breast cancer Mother   . Breast cancer Sister   . Breast cancer Maternal Aunt     Social History Social History   Tobacco Use  . Smoking status: Current Some Day Smoker    Packs/day: 1.00    Types: Cigarettes    Last attempt to quit: 12/11/2012    Years since quitting: 4.4  . Smokeless tobacco: Never Used  . Tobacco comment: 3 cigs per day  Substance Use Topics  . Alcohol use: Yes    Comment: Beer.  . Drug use: No     Allergies   Patient has no known allergies.   Review of Systems Review of Systems  Constitutional: Negative for chills and fever.  HENT: Positive for congestion, ear pain (L) and sneezing. Negative for facial swelling and sore throat.   Respiratory: Positive for cough. Negative for chest tightness and shortness of breath.   Cardiovascular: Negative for chest pain and leg swelling.  Gastrointestinal: Negative for abdominal pain, nausea and vomiting.  Genitourinary:  Negative for dysuria.  Musculoskeletal: Negative for back pain.  Skin: Negative for rash and wound.  Neurological: Negative for headaches.  Psychiatric/Behavioral: The patient is not nervous/anxious.      Physical Exam Updated Vital Signs BP (!) 130/93 (BP Location: Right Arm)   Pulse 71   Temp 97.9 F (36.6 C) (Oral)   Resp 20   SpO2 97%   Physical Exam  Constitutional:  She appears well-developed and well-nourished. No distress.  Patient well-appearing, drinking coffee  HENT:  Head: Normocephalic and atraumatic.  Right Ear: Tympanic membrane normal. No mastoid tenderness.  Left Ear: No mastoid tenderness. Tympanic membrane is erythematous. Tympanic membrane is not bulging. A middle ear effusion is present.  Mouth/Throat: Oropharynx is clear and moist. No oropharyngeal exudate.  Eyes: Pupils are equal, round, and reactive to light. Conjunctivae are normal. Right eye exhibits no discharge. Left eye exhibits no discharge. No scleral icterus.  Neck: Normal range of motion. Neck supple. No thyromegaly present.  Cardiovascular: Normal rate, regular rhythm, normal heart sounds and intact distal pulses. Exam reveals no gallop and no friction rub.  No murmur heard. Pulmonary/Chest: Effort normal and breath sounds normal. No stridor. No respiratory distress. She has no wheezes. She has no rales. She exhibits no tenderness.  Abdominal: Soft. Bowel sounds are normal. She exhibits no distension. There is no tenderness. There is no rebound and no guarding.  Musculoskeletal: She exhibits no edema or tenderness.  Lymphadenopathy:    She has no cervical adenopathy.  Neurological: She is alert. Coordination normal.  Skin: Skin is warm and dry. No rash noted. She is not diaphoretic. No pallor.  Psychiatric: She has a normal mood and affect.  Nursing note and vitals reviewed.    ED Treatments / Results  Labs (all labs ordered are listed, but only abnormal results are displayed) Labs Reviewed   BASIC METABOLIC PANEL - Abnormal; Notable for the following components:      Result Value   Sodium 132 (*)    Chloride 95 (*)    Glucose, Bld 139 (*)    All other components within normal limits  CBC  I-STAT TROPONIN, ED    EKG EKG Interpretation  Date/Time:  Friday May 30 2017 21:49:30 EDT Ventricular Rate:  82 PR Interval:  208 QRS Duration: 92 QT Interval:  376 QTC Calculation: 439 R Axis:   29 Text Interpretation:  Normal sinus rhythm Normal ECG When compared with ECG of 08/28/2015, No significant change was found Confirmed by Dione Booze ([id number]) on 05/31/2017 1:24:06 AM   Radiology Dg Chest 2 View  Result Date: 05/30/2017 CLINICAL DATA:  52 y/o F; cough and shortness of breath for 2 weeks. EXAM: CHEST - 2 VIEW COMPARISON:  08/28/2015 chest radiograph. FINDINGS: Stable heart size and mediastinal contours are within normal limits. Both lungs are clear. The visualized skeletal structures are unremarkable. IMPRESSION: No acute pulmonary process identified. Electronically Signed   By: Mitzi Hansen M.D.   On: 05/30/2017 22:42    Procedures Procedures (including critical care time)  Medications Ordered in ED Medications - No data to display   Initial Impression / Assessment and Plan / ED Course  I have reviewed the triage vital signs and the nursing notes.  Pertinent labs & imaging results that were available during my care of the patient were reviewed by me and considered in my medical decision making (see chart for details).     Patient presenting with cough for 2 weeks.  She has had associated sneezing, nasal congestion, and left ear pain.  She denies shortness of breath and fevers.  CBC, BMP within normal limits, troponin negative.  Chest x-ray is negative.  Lungs are clear on my exam.  Suspect viral URI versus allergy symptoms in addition to left otitis media.  Will treat with amoxicillin, as well as Zyrtec and Tessalon.  Patient is very well-appearing.   Patient to follow-up with PCP in  2 to 3 days for recheck.  Return precautions discussed.  Patient understands and agrees with plan.  Patient vitals stable throughout ED course and discharged in satisfactory condition.  Final Clinical Impressions(s) / ED Diagnoses   Final diagnoses:  Cough  Non-recurrent acute suppurative otitis media of left ear without spontaneous rupture of tympanic membrane    ED Discharge Orders        Ordered    amoxicillin (AMOXIL) 500 MG capsule  2 times daily     05/31/17 0822    cetirizine (ZYRTEC ALLERGY) 10 MG tablet  Daily     05/31/17 0822    benzonatate (TESSALON) 100 MG capsule  Every 8 hours     05/31/17 0822       Emi Holes, PA-C 05/31/17 1029    Jacalyn Lefevre, MD 05/31/17 1040

## 2017-06-18 ENCOUNTER — Other Ambulatory Visit: Payer: Self-pay | Admitting: Internal Medicine

## 2017-06-18 ENCOUNTER — Telehealth: Payer: Self-pay | Admitting: *Deleted

## 2017-06-18 DIAGNOSIS — R300 Dysuria: Secondary | ICD-10-CM

## 2017-06-18 MED ORDER — HYDROXYZINE HCL 25 MG PO TABS: 25.0000 mg | ORAL_TABLET | Freq: Every day | ORAL | 1 refills | Status: DC | PRN

## 2017-06-18 NOTE — Telephone Encounter (Signed)
Fax from [facility] pharmacy requesting to change Hydroxyzine 25 mg capsules to tablets. Capsules are on backorder. Thanks

## 2017-06-18 NOTE — Telephone Encounter (Signed)
Done, Thanks Glenda!

## 2017-06-26 ENCOUNTER — Other Ambulatory Visit: Payer: Self-pay | Admitting: Internal Medicine

## 2017-06-26 DIAGNOSIS — I10 Essential (primary) hypertension: Secondary | ICD-10-CM

## 2017-07-09 ENCOUNTER — Encounter (INDEPENDENT_AMBULATORY_CARE_PROVIDER_SITE_OTHER): Payer: Self-pay

## 2017-07-09 ENCOUNTER — Ambulatory Visit: Admitting: Internal Medicine

## 2017-07-09 ENCOUNTER — Other Ambulatory Visit: Payer: Self-pay

## 2017-07-09 VITALS — BP 149/92 | HR 82 | Temp 98.4°F | Ht 63.0 in | Wt 180.1 lb

## 2017-07-09 DIAGNOSIS — Z79899 Other long term (current) drug therapy: Secondary | ICD-10-CM

## 2017-07-09 DIAGNOSIS — E1129 Type 2 diabetes mellitus with other diabetic kidney complication: Secondary | ICD-10-CM

## 2017-07-09 DIAGNOSIS — E1140 Type 2 diabetes mellitus with diabetic neuropathy, unspecified: Secondary | ICD-10-CM | POA: Diagnosis not present

## 2017-07-09 DIAGNOSIS — Z7984 Long term (current) use of oral hypoglycemic drugs: Secondary | ICD-10-CM

## 2017-07-09 DIAGNOSIS — I10 Essential (primary) hypertension: Secondary | ICD-10-CM

## 2017-07-09 DIAGNOSIS — E785 Hyperlipidemia, unspecified: Secondary | ICD-10-CM | POA: Diagnosis not present

## 2017-07-09 DIAGNOSIS — R809 Proteinuria, unspecified: Secondary | ICD-10-CM

## 2017-07-09 LAB — POCT GLYCOSYLATED HEMOGLOBIN (HGB A1C): Hemoglobin A1C: 6.8 % — AB (ref 4.0–5.6)

## 2017-07-09 LAB — GLUCOSE, CAPILLARY: Glucose-Capillary: 98 mg/dL (ref 65–99)

## 2017-07-09 MED ORDER — AMLODIPINE BESYLATE 5 MG PO TABS: 5.0000 mg | ORAL_TABLET | Freq: Every day | ORAL | 1 refills | Status: DC

## 2017-07-09 MED ORDER — AMLODIPINE BESYLATE 10 MG PO TABS: 10.0000 mg | ORAL_TABLET | Freq: Every day | ORAL | 1 refills | Status: DC

## 2017-07-09 MED ORDER — CARVEDILOL 6.25 MG PO TABS: 6.2500 mg | ORAL_TABLET | Freq: Two times a day (BID) | ORAL | 1 refills | Status: DC

## 2017-07-09 MED ORDER — GABAPENTIN 300 MG PO CAPS: 300.0000 mg | ORAL_CAPSULE | Freq: Every day | ORAL | 1 refills | Status: DC

## 2017-07-09 MED ORDER — ATORVASTATIN CALCIUM 40 MG PO TABS: 40.0000 mg | ORAL_TABLET | Freq: Every day | ORAL | 1 refills | Status: DC

## 2017-07-09 NOTE — Progress Notes (Signed)
   CC: follow-up of DM2, HTN  HPI:  [name] is a 52 y.o. F with well-controlled DM2, HTN, HLD and history of AUD who presents today to follow-up her chronic medical conditions. She has no accute complaints but does endorse burning BL foot pain.  For details regarding today's visit and the status of their chronic medical issues, please refer to the assessment and plan.  **Of note, [facility] of [facility].  Past Medical History:  Diagnosis Date  . Anemia    when in late teens  . Anxiety   . Breast abscess   . Chlamydia   . Depression    PTSD also  . Diabetes mellitus without complication (HCC)   . Gonorrhea   . High cholesterol   . Hypercholesteremia   . Hypertension   . Recurrent upper respiratory infection (URI)    URI/SOB-treated with antibiotics-cleared up now  . Seizures (HCC)   . Substance abuse (HCC)    alcohol  . Tuberculosis    was treated   Review of Systems:   General: Denies fevers, chills, weight loss HEENT: Denies acute changes in vision, sore throat Cardiac: Denies CP, SOB, palpitations Pulmonary: Occasional cough. Denies wheezes Abd: Denies abdominal pain, changes in bowels Extremities: Admits to BL LE pain from feet to knees. Denies weakness or swelling  Physical Exam: General: Alert, in no acute distress. Pleasant and conversant but understandably tearful when discussing husbands health. HEENT: No icterus, injection or ptosis. No hoarseness or dysarthria  Cardiac: RRR, no MGR appreciated Pulmonary: CTA BL with normal WOB on RA. Able to speak in complete sentences Abd: Soft, non-tender. +bs Extremities: Warm, perfused. No significant pedal edema. Intact distal pulses.   Vitals:   07/09/17 1521  BP: (!) 149/92  Pulse: 82  Temp: 98.4 F (36.9 C)  TempSrc: Oral  SpO2: 92%  Weight: 180 lb 1.6 oz (81.7 kg)  Height: 5\' 3"  (1.6 m)   Body mass index is 31.9 kg/m.  Assessment &  Plan:   See Encounters Tab for problem based charting.  Patient discussed with Dr. Criselda PeachesMullen

## 2017-07-09 NOTE — Assessment & Plan Note (Addendum)
Assessment: BP 149/92 which did not improve significantly on recheck. Reviewed patients medication bottles; seems to be compliant with Lisinopril 40mg  and Coreg 6.25mg  BID but denies taking Amlodipine recently despite this being on her medication list. Renal function stable last month.   Plan: Start Amlodipine 5mg  daily. Rx sent to pharmacy and encouraged patient to pick-up. Reassess BP at follow-up in 3 months.

## 2017-07-09 NOTE — Assessment & Plan Note (Addendum)
Assessment: A1c 6.8% today! Notes compliance with Metformin 2000mg  daily. Continues to check CBGs daily but actually does seem to use the information to alter her diet. Checks feet regularly and uses diabetic foot cream to stay moisturized. No changes in vision. Does complain of BL foot pain extending up to mid-calves BL. Denies picking-up Gabapentin Rx from pharmacy and doesn't think she's ever taken this.   Plan: Encouraged patient to keep up the great work! Continue Metformin 2000mg  daily. Advised she doesn't necessarily need to check her blood sugars but patient still prefers to which I think is reasonable in this patient as she reflects on diet and how it relates to her CBG. Does complain of BL burning foot pain which is likely related to diabetic neuropathy. Rx for Gabapentin 300mg  QHS sent to pharmacy; encouraged to pick-up. Statin refilled as well.

## 2017-07-09 NOTE — Patient Instructions (Addendum)
It was nice seeing you today. Thank you for choosing [facility] for your Primary Care.   Today we talked about:  1) Diabetes: You are doing a wonderful job managing your diabetes! Your A1c was 6.8% which is at your goal. Please continue taking Metformin 1000mg  twice daily and watching your carbohydrate intake.  2) Burning foot pain: I believe your foot pain is related to 'Diabetic Neuropathy.' Unfortunately diabetes can damage your nerves, most often in your feet. I prescribed Gabapentin 300mg  every night. Please try this to see if it helps.  3) high blood pressure. I've sent a prescription for Amlodipine 5mg  to your pharmacy. Please start taking this daily.   FOLLOW-UP INSTRUCTIONS When: 643-months For: HTN, DM2 What to bring: medications  Please contact the clinic if you have any problems, or need to be seen sooner.

## 2017-07-16 NOTE — Progress Notes (Signed)
Internal Medicine Clinic Attending  Case discussed with Dr. Molt at the time of the visit.  We reviewed the resident's history and exam and pertinent patient test results.  I agree with the assessment, diagnosis, and plan of care documented in the resident's note. 

## 2017-09-02 ENCOUNTER — Other Ambulatory Visit: Payer: Self-pay | Admitting: Internal Medicine

## 2017-09-17 ENCOUNTER — Emergency Department (HOSPITAL_COMMUNITY)
Admission: EM | Admit: 2017-09-17 | Discharge: 2017-09-17 | Disposition: A | Discharge status: Home / Self Care | Attending: Emergency Medicine | Admitting: Emergency Medicine

## 2017-09-17 ENCOUNTER — Encounter (HOSPITAL_COMMUNITY): Payer: Self-pay | Admitting: *Deleted

## 2017-09-17 ENCOUNTER — Emergency Department (HOSPITAL_COMMUNITY)

## 2017-09-17 ENCOUNTER — Other Ambulatory Visit: Payer: Self-pay

## 2017-09-17 DIAGNOSIS — R112 Nausea with vomiting, unspecified: Secondary | ICD-10-CM | POA: Insufficient documentation

## 2017-09-17 DIAGNOSIS — R079 Chest pain, unspecified: Secondary | ICD-10-CM | POA: Diagnosis not present

## 2017-09-17 DIAGNOSIS — Z79899 Other long term (current) drug therapy: Secondary | ICD-10-CM | POA: Insufficient documentation

## 2017-09-17 DIAGNOSIS — I10 Essential (primary) hypertension: Secondary | ICD-10-CM | POA: Diagnosis not present

## 2017-09-17 DIAGNOSIS — R0602 Shortness of breath: Secondary | ICD-10-CM | POA: Diagnosis not present

## 2017-09-17 DIAGNOSIS — Z7984 Long term (current) use of oral hypoglycemic drugs: Secondary | ICD-10-CM | POA: Insufficient documentation

## 2017-09-17 DIAGNOSIS — Z6379 Other stressful life events affecting family and household: Secondary | ICD-10-CM | POA: Diagnosis not present

## 2017-09-17 DIAGNOSIS — F17210 Nicotine dependence, cigarettes, uncomplicated: Secondary | ICD-10-CM | POA: Diagnosis not present

## 2017-09-17 DIAGNOSIS — E119 Type 2 diabetes mellitus without complications: Secondary | ICD-10-CM | POA: Insufficient documentation

## 2017-09-17 LAB — COMPREHENSIVE METABOLIC PANEL
ALBUMIN: 4 g/dL (ref 3.5–5.0)
ALK PHOS: 118 U/L (ref 38–126)
ALT: 37 U/L (ref 0–44)
AST: 44 U/L — ABNORMAL HIGH (ref 15–41)
Anion gap: 14 (ref 5–15)
BUN: 5 mg/dL — ABNORMAL LOW (ref 6–20)
CALCIUM: 9.4 mg/dL (ref 8.9–10.3)
CHLORIDE: 94 mmol/L — AB (ref 98–111)
CO2: 25 mmol/L (ref 22–32)
CREATININE: 0.65 mg/dL (ref 0.44–1.00)
GFR calc Af Amer: >60 mL/min (ref >60)
GFR calc non Af Amer: >60 mL/min (ref >60)
GLUCOSE: 158 mg/dL — AB (ref 70–99)
Potassium: 4.8 mmol/L (ref 3.5–5.1)
SODIUM: 133 mmol/L — AB (ref 135–145)
Total Bilirubin: 0.6 mg/dL (ref 0.3–1.2)
Total Protein: 8.8 g/dL — ABNORMAL HIGH (ref 6.5–8.1)

## 2017-09-17 LAB — TROPONIN I: Troponin I: <0.03 ng/mL (ref <0.03)

## 2017-09-17 LAB — CBC WITH DIFFERENTIAL/PLATELET
Basophils Absolute: 0 10*3/uL (ref 0.0–0.1)
Basophils Relative: 0 %
EOS ABS: 0.1 10*3/uL (ref 0.0–0.7)
EOS PCT: 1 %
HCT: 43.3 % (ref 36.0–46.0)
HEMOGLOBIN: 14.6 g/dL (ref 12.0–15.0)
LYMPHS PCT: 15 %
Lymphs Abs: 1.6 10*3/uL (ref 0.7–4.0)
MCH: 27.1 pg (ref 26.0–34.0)
MCHC: 33.7 g/dL (ref 30.0–36.0)
MCV: 80.5 fL (ref 78.0–100.0)
MONOS PCT: 6 %
Monocytes Absolute: 0.6 10*3/uL (ref 0.1–1.0)
NEUTROS PCT: 78 %
Neutro Abs: 8.5 10*3/uL — ABNORMAL HIGH (ref 1.7–7.7)
Platelets: 390 10*3/uL (ref 150–400)
RBC: 5.38 MIL/uL — AB (ref 3.87–5.11)
RDW: 14.5 % (ref 11.5–15.5)
WBC: 10.8 10*3/uL — ABNORMAL HIGH (ref 4.0–10.5)

## 2017-09-17 MED ORDER — ALBUTEROL SULFATE (2.5 MG/3ML) 0.083% IN NEBU
5.0000 mg | INHALATION_SOLUTION | Freq: Once | RESPIRATORY_TRACT | Status: AC
  Administered 2017-09-17: 5 mg via RESPIRATORY_TRACT
  Filled 2017-09-17: qty 6

## 2017-09-17 MED ORDER — ONDANSETRON HCL 4 MG/2ML IJ SOLN
4.0000 mg | Freq: Once | INTRAMUSCULAR | Status: AC
  Administered 2017-09-17: 4 mg via INTRAVENOUS
  Filled 2017-09-17: qty 2

## 2017-09-17 NOTE — ED Provider Notes (Signed)
[facility]-EMERGENCY DEPT Provider Note   CSN: [id number] Arrival date & time: 09/17/17  1138     History   Chief Complaint Chief Complaint  Patient presents with  . Shortness of Breath    HPI [name] is a 52 y.o. female.  HPI Patient presents with shortness of breath and feeling bad.  States she feels bad because she just found out that her husband is now likely to die.  He is at [facility] with multiple different medical problems.  States she is now having more shortness of breath.  States she has had nausea some vomiting.  Some chest tightness.  No fevers.  When asked if she smokes she says no I have not smoked in 3 weeks.  No abdominal pain.  No sick contacts.  She thinks a lot of this could be due to just finding out about her husband. Past Medical History:  Diagnosis Date  . Anemia    when in late teens  . Anxiety   . Breast abscess   . Chlamydia   . Depression    PTSD also  . Diabetes mellitus without complication (HCC)   . Gonorrhea   . High cholesterol   . Hypercholesteremia   . Hypertension   . Recurrent upper respiratory infection (URI)    URI/SOB-treated with antibiotics-cleared up now  . Seizures (HCC)   . Substance abuse (HCC)    alcohol  . Tuberculosis    was treated    Patient Active Problem List   Diagnosis Date Noted  . Healthcare maintenance 11/12/2016  . Occipital neuralgia of left side 06/21/2016  . Dysuria 05/21/2016  . Avascular necrosis (HCC) 03/01/2016  . Chronic hyponatremia 08/29/2015  . Tobacco use disorder 04/12/2015  . Abnormal brain MRI 03/10/2015  . Elevated liver enzymes 04/14/2013  . Diabetic neuropathy, painful (HCC) 04/14/2013  . Alcohol dependence (HCC) 03/23/2013  . Substance induced mood disorder (HCC) 03/22/2013  . Depression 02/05/2012  . Hyperlipidemia 02/05/2012  . Hypertension 01/07/2012  . Type 2 diabetes mellitus with microalbuminuria, without long-term current use of  insulin (HCC) 01/07/2012    Past Surgical History:  Procedure Laterality Date  . BREAST BIOPSY  11/13/2010  . BREAST LUMPECTOMY    . INCISE AND DRAIN ABCESS  01/03/11   breast abscess  . TONGUE SURGERY  2008   lump removed     OB History    Gravida  2   Para  2   Term  2   Preterm      AB      Living  2     SAB      TAB      Ectopic      Multiple      Live Births               Home Medications    Prior to Admission medications   Medication Sig Start Date End Date Taking? Authorizing Provider  amLODipine (NORVASC) 10 MG tablet Take 10 mg by mouth daily. 07/09/17  Yes [provider]  atorvastatin (LIPITOR) 40 MG tablet Take 1 tablet (40 mg total) by mouth daily. IM Program. 07/09/17 09/17/17 Yes Molt, Bethany, DO  carvedilol (COREG) 6.25 MG tablet Take 1 tablet (6.25 mg total) by mouth 2 (two) times daily. Patient taking differently: Take 6.25 mg by mouth daily.  07/09/17 01/05/18 Yes Molt, Bethany, DO  gabapentin (NEURONTIN) 300 MG capsule Take 1 capsule (300 mg total) by mouth  at bedtime. 07/09/17  Yes Molt, Bethany, DO  hydrOXYzine (ATARAX/VISTARIL) 25 MG tablet TAKE 1 TABLET BY MOUTH ONCE DAILY AS NEEDED FOR ITCHING (BLADDER  PAIN) 09/03/17  Yes Molt, Bethany, DO  lisinopril (PRINIVIL,ZESTRIL) 40 MG tablet Take 1 tablet (40 mg total) by mouth daily. 03/04/17  Yes Molt, Bethany, DO  metFORMIN (GLUCOPHAGE) 1000 MG tablet Take 1 tablet (1,000 mg total) by mouth 2 (two) times daily with a meal. 04/28/17  Yes Molt, Bethany, DO  Phenazopyridine HCl (AZO TABS PO) Take 2 tablets by mouth daily as needed (bladder pain).   Yes [provider]  cetirizine (ZYRTEC ALLERGY) 10 MG tablet Take 1 tablet (10 mg total) by mouth daily. Patient not taking: Reported on 09/17/2017 05/31/17   Emi HolesLaw, Alexandra M, PA-C    Family History Family History  Problem Relation Age of Onset  . Breast cancer Mother   . Breast cancer Sister   . Breast cancer Maternal Aunt      Social History Social History   Tobacco Use  . Smoking status: Current Some Day Smoker    Packs/day: 1.00    Types: Cigarettes    Last attempt to quit: 12/11/2012    Years since quitting: 4.7  . Smokeless tobacco: Never Used  . Tobacco comment: 3 cigs per day  Substance Use Topics  . Alcohol use: Yes    Comment: Beer.  . Drug use: No     Allergies   Patient has no known allergies.   Review of Systems Review of Systems  Constitutional: Positive for appetite change.  HENT: Negative for congestion.   Respiratory: Positive for shortness of breath.   Cardiovascular: Positive for chest pain.  Gastrointestinal: Positive for nausea. Negative for abdominal pain.  Genitourinary: Negative for pelvic pain.  Musculoskeletal: Negative for back pain.  Neurological: Negative for speech difficulty.  Psychiatric/Behavioral: Negative for confusion.     Physical Exam Updated Vital Signs BP (!) 165/105   Pulse (!) 115   Temp 98 F (36.7 C)   Resp (!) 21   Ht 5\' 3"  (1.6 m)   Wt 81.6 kg   SpO2 94%   BMI 31.89 kg/m   Physical Exam  Constitutional: She appears well-developed.  HENT:  Head: Atraumatic.  Neck: Normal range of motion.  Cardiovascular:  Mild tachycardia  Pulmonary/Chest: Effort normal.  No focal rales or rhonchi  Abdominal: Soft.  Musculoskeletal:       Right lower leg: She exhibits no edema.       Left lower leg: She exhibits no edema.  Skin: Skin is warm.  Psychiatric:  Patient is tearful.     ED Treatments / Results  Labs (all labs ordered are listed, but only abnormal results are displayed) Labs Reviewed  COMPREHENSIVE METABOLIC PANEL - Abnormal; Notable for the following components:      Result Value   Sodium 133 (*)    Chloride 94 (*)    Glucose, Bld 158 (*)    BUN 5 (*)    Total Protein 8.8 (*)    AST 44 (*)    All other components within normal limits  CBC WITH DIFFERENTIAL/PLATELET - Abnormal; Notable for the following components:    WBC 10.8 (*)    RBC 5.38 (*)    Neutro Abs 8.5 (*)    All other components within normal limits  TROPONIN I    EKG None  Radiology Dg Chest 2 View  Result Date: 09/17/2017 CLINICAL DATA:  Chest pain, sob, and nausea/vomiting  EXAM: CHEST - 2 VIEW COMPARISON:  05/30/2017 FINDINGS: Lungs are clear. Heart size and mediastinal contours are within normal limits. No effusion.  No pneumothorax. Visualized bones unremarkable. IMPRESSION: No acute cardiopulmonary disease. Electronically Signed   By: Corlis Leak M.D.   On: 09/17/2017 13:12    Procedures Procedures (including critical care time)  Medications Ordered in ED Medications  albuterol (PROVENTIL) (2.5 MG/3ML) 0.083% nebulizer solution 5 mg (5 mg Nebulization Given 09/17/17 1255)  ondansetron (ZOFRAN) injection 4 mg (4 mg Intravenous Given 09/17/17 1312)     Initial Impression / Assessment and Plan / ED Course  I have reviewed the triage vital signs and the nursing notes.  Pertinent labs & imaging results that were available during my care of the patient were reviewed by me and considered in my medical decision making (see chart for details).     Patient with shortness of breath.  Feels better after rest.  May be an anxiety component.  Patient's husband is critically ill.  Work-up reassuring.  Will discharge home  Final Clinical Impressions(s) / ED Diagnoses   Final diagnoses:  Shortness of breath    ED Discharge Orders    None       Benjiman Core, MD 09/17/17 [phone]

## 2017-09-17 NOTE — ED Triage Notes (Signed)
Pt talking on phone when entering room, [facility]. Pt has labored resp ? 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

## 2017-10-28 ENCOUNTER — Encounter: Payer: Self-pay | Admitting: Internal Medicine

## 2017-10-29 ENCOUNTER — Ambulatory Visit: Payer: Self-pay

## 2017-10-29 ENCOUNTER — Encounter: Payer: Self-pay | Admitting: Internal Medicine

## 2017-11-26 ENCOUNTER — Ambulatory Visit: Payer: Self-pay

## 2017-12-22 ENCOUNTER — Emergency Department (HOSPITAL_COMMUNITY)

## 2017-12-22 ENCOUNTER — Emergency Department (HOSPITAL_BASED_OUTPATIENT_CLINIC_OR_DEPARTMENT_OTHER)

## 2017-12-22 ENCOUNTER — Observation Stay (HOSPITAL_COMMUNITY)
Admission: EM | Admit: 2017-12-22 | Discharge: 2017-12-23 | Disposition: A | Discharge status: Home / Self Care | Attending: Internal Medicine | Admitting: Internal Medicine

## 2017-12-22 ENCOUNTER — Other Ambulatory Visit: Payer: Self-pay

## 2017-12-22 ENCOUNTER — Encounter (HOSPITAL_COMMUNITY): Payer: Self-pay | Admitting: Emergency Medicine

## 2017-12-22 DIAGNOSIS — E861 Hypovolemia: Secondary | ICD-10-CM | POA: Diagnosis not present

## 2017-12-22 DIAGNOSIS — E1140 Type 2 diabetes mellitus with diabetic neuropathy, unspecified: Secondary | ICD-10-CM | POA: Diagnosis not present

## 2017-12-22 DIAGNOSIS — E860 Dehydration: Secondary | ICD-10-CM | POA: Insufficient documentation

## 2017-12-22 DIAGNOSIS — F32A Depression, unspecified: Secondary | ICD-10-CM | POA: Diagnosis present

## 2017-12-22 DIAGNOSIS — I951 Orthostatic hypotension: Secondary | ICD-10-CM

## 2017-12-22 DIAGNOSIS — R55 Syncope and collapse: Secondary | ICD-10-CM | POA: Diagnosis present

## 2017-12-22 DIAGNOSIS — E785 Hyperlipidemia, unspecified: Secondary | ICD-10-CM | POA: Diagnosis present

## 2017-12-22 DIAGNOSIS — R3915 Urgency of urination: Secondary | ICD-10-CM | POA: Insufficient documentation

## 2017-12-22 DIAGNOSIS — I950 Idiopathic hypotension: Secondary | ICD-10-CM | POA: Diagnosis not present

## 2017-12-22 DIAGNOSIS — Z7984 Long term (current) use of oral hypoglycemic drugs: Secondary | ICD-10-CM | POA: Diagnosis not present

## 2017-12-22 DIAGNOSIS — F329 Major depressive disorder, single episode, unspecified: Secondary | ICD-10-CM | POA: Insufficient documentation

## 2017-12-22 DIAGNOSIS — I959 Hypotension, unspecified: Principal | ICD-10-CM | POA: Insufficient documentation

## 2017-12-22 DIAGNOSIS — I10 Essential (primary) hypertension: Secondary | ICD-10-CM | POA: Diagnosis present

## 2017-12-22 DIAGNOSIS — F1021 Alcohol dependence, in remission: Secondary | ICD-10-CM | POA: Diagnosis not present

## 2017-12-22 DIAGNOSIS — E871 Hypo-osmolality and hyponatremia: Secondary | ICD-10-CM | POA: Diagnosis present

## 2017-12-22 DIAGNOSIS — N179 Acute kidney failure, unspecified: Secondary | ICD-10-CM

## 2017-12-22 DIAGNOSIS — R809 Proteinuria, unspecified: Secondary | ICD-10-CM

## 2017-12-22 DIAGNOSIS — E7409 Other glycogen storage disease: Secondary | ICD-10-CM | POA: Diagnosis present

## 2017-12-22 DIAGNOSIS — R42 Dizziness and giddiness: Secondary | ICD-10-CM

## 2017-12-22 DIAGNOSIS — Z79899 Other long term (current) drug therapy: Secondary | ICD-10-CM | POA: Diagnosis not present

## 2017-12-22 DIAGNOSIS — F1020 Alcohol dependence, uncomplicated: Secondary | ICD-10-CM | POA: Diagnosis present

## 2017-12-22 DIAGNOSIS — E1169 Type 2 diabetes mellitus with other specified complication: Secondary | ICD-10-CM | POA: Diagnosis present

## 2017-12-22 DIAGNOSIS — E1129 Type 2 diabetes mellitus with other diabetic kidney complication: Secondary | ICD-10-CM

## 2017-12-22 DIAGNOSIS — R52 Pain, unspecified: Secondary | ICD-10-CM | POA: Diagnosis not present

## 2017-12-22 HISTORY — DX: Pain in right foot: M79.671

## 2017-12-22 HISTORY — DX: Orthostatic hypotension: I95.1

## 2017-12-22 HISTORY — DX: Pain in right foot: M79.672

## 2017-12-22 LAB — RAPID URINE DRUG SCREEN, HOSP PERFORMED
Amphetamines: NOT DETECTED
Barbiturates: NOT DETECTED
Benzodiazepines: NOT DETECTED
Cocaine: NOT DETECTED
Opiates: NOT DETECTED
Tetrahydrocannabinol: NOT DETECTED

## 2017-12-22 LAB — COMPREHENSIVE METABOLIC PANEL
ALK PHOS: 86 U/L (ref 38–126)
ALT: 57 U/L — AB (ref 0–44)
ANION GAP: 13 (ref 5–15)
AST: 58 U/L — ABNORMAL HIGH (ref 15–41)
Albumin: 3.9 g/dL (ref 3.5–5.0)
BILIRUBIN TOTAL: 1 mg/dL (ref 0.3–1.2)
BUN: 11 mg/dL (ref 6–20)
CO2: 23 mmol/L (ref 22–32)
Calcium: 9.4 mg/dL (ref 8.9–10.3)
Chloride: 89 mmol/L — ABNORMAL LOW (ref 98–111)
Creatinine, Ser: 1.13 mg/dL — ABNORMAL HIGH (ref 0.44–1.00)
GFR, EST NON AFRICAN AMERICAN: 56 mL/min — AB (ref >60)
GLUCOSE: 139 mg/dL — AB (ref 70–99)
Potassium: 3.9 mmol/L (ref 3.5–5.1)
Sodium: 125 mmol/L — ABNORMAL LOW (ref 135–145)
Total Protein: 7.4 g/dL (ref 6.5–8.1)

## 2017-12-22 LAB — CBC WITH DIFFERENTIAL/PLATELET
Abs Immature Granulocytes: 0.02 10*3/uL (ref 0.00–0.07)
BASOS ABS: 0 10*3/uL (ref 0.0–0.1)
BASOS PCT: 0 %
EOS ABS: 0.2 10*3/uL (ref 0.0–0.5)
Eosinophils Relative: 3 %
HCT: 40.3 % (ref 36.0–46.0)
Hemoglobin: 13 g/dL (ref 12.0–15.0)
IMMATURE GRANULOCYTES: 0 %
Lymphocytes Relative: 22 %
Lymphs Abs: 1.7 10*3/uL (ref 0.7–4.0)
MCH: 25.8 pg — ABNORMAL LOW (ref 26.0–34.0)
MCHC: 32.3 g/dL (ref 30.0–36.0)
MCV: 80 fL (ref 80.0–100.0)
Monocytes Absolute: 0.4 10*3/uL (ref 0.1–1.0)
Monocytes Relative: 5 %
NEUTROS PCT: 70 %
NRBC: 0 % (ref 0.0–0.2)
Neutro Abs: 5.5 10*3/uL (ref 1.7–7.7)
PLATELETS: 571 10*3/uL — AB (ref 150–400)
RBC: 5.04 MIL/uL (ref 3.87–5.11)
RDW: 16.9 % — AB (ref 11.5–15.5)
WBC: 7.8 10*3/uL (ref 4.0–10.5)

## 2017-12-22 LAB — URINALYSIS, ROUTINE W REFLEX MICROSCOPIC
Bilirubin Urine: NEGATIVE
Glucose, UA: NEGATIVE mg/dL
Hgb urine dipstick: NEGATIVE
Ketones, ur: NEGATIVE mg/dL
Nitrite: NEGATIVE
Protein, ur: NEGATIVE mg/dL
Specific Gravity, Urine: 1.006 (ref 1.005–1.030)
pH: 6 (ref 5.0–8.0)

## 2017-12-22 LAB — SALICYLATE LEVEL

## 2017-12-22 LAB — ACETAMINOPHEN LEVEL: Acetaminophen (Tylenol), Serum: <10 ug/mL — ABNORMAL LOW (ref 10–30)

## 2017-12-22 LAB — GLUCOSE, CAPILLARY: Glucose-Capillary: 91 mg/dL (ref 70–99)

## 2017-12-22 LAB — CBG MONITORING, ED: Glucose-Capillary: 111 mg/dL — ABNORMAL HIGH (ref 70–99)

## 2017-12-22 LAB — LACTIC ACID, PLASMA
Lactic Acid, Venous: 1.9 mmol/L (ref 0.5–1.9)
Lactic Acid, Venous: 1.8 mmol/L (ref 0.5–1.9)

## 2017-12-22 LAB — MAGNESIUM: MAGNESIUM: 1.4 mg/dL — AB (ref 1.7–2.4)

## 2017-12-22 LAB — D-DIMER, QUANTITATIVE: D-Dimer, Quant: 3.74 ug/mL-FEU — ABNORMAL HIGH (ref 0.00–0.50)

## 2017-12-22 LAB — TROPONIN I

## 2017-12-22 LAB — ETHANOL: Alcohol, Ethyl (B): <10 mg/dL (ref <10)

## 2017-12-22 MED ORDER — MAGNESIUM SULFATE 2 GM/50ML IV SOLN
2.0000 g | Freq: Once | INTRAVENOUS | Status: AC
  Administered 2017-12-22: 2 g via INTRAVENOUS
  Filled 2017-12-22: qty 50

## 2017-12-22 MED ORDER — ENOXAPARIN SODIUM 30 MG/0.3ML ~~LOC~~ SOLN
30.0000 mg | SUBCUTANEOUS | Status: DC
  Administered 2017-12-22: 30 mg via SUBCUTANEOUS
  Filled 2017-12-22: qty 0.3

## 2017-12-22 MED ORDER — SODIUM CHLORIDE 0.9 % IV BOLUS
1000.0000 mL | Freq: Once | INTRAVENOUS | Status: AC
  Administered 2017-12-22: 1000 mL via INTRAVENOUS

## 2017-12-22 MED ORDER — DOCUSATE SODIUM 100 MG PO CAPS: 100.0000 mg | ORAL_CAPSULE | Freq: Two times a day (BID) | ORAL | Status: DC | PRN

## 2017-12-22 MED ORDER — ONDANSETRON HCL 4 MG PO TABS: 4.0000 mg | ORAL_TABLET | Freq: Four times a day (QID) | ORAL | Status: DC | PRN

## 2017-12-22 MED ORDER — BISACODYL 10 MG RE SUPP: 10.0000 mg | Freq: Every day | RECTAL | Status: DC | PRN

## 2017-12-22 MED ORDER — CIPROFLOXACIN HCL 250 MG PO TABS
250.0000 mg | ORAL_TABLET | Freq: Two times a day (BID) | ORAL | Status: DC
  Administered 2017-12-22 – 2017-12-23 (×2): 250 mg via ORAL
  Filled 2017-12-22 (×2): qty 1

## 2017-12-22 MED ORDER — IOPAMIDOL (ISOVUE-370) INJECTION 76%
100.0000 mL | Freq: Once | INTRAVENOUS | Status: AC | PRN
  Administered 2017-12-22: 100 mL via INTRAVENOUS

## 2017-12-22 MED ORDER — IOPAMIDOL (ISOVUE-370) INJECTION 76%
INTRAVENOUS | Status: AC
  Filled 2017-12-22: qty 100

## 2017-12-22 MED ORDER — SODIUM CHLORIDE 0.9 % IV SOLN
INTRAVENOUS | Status: DC
  Administered 2017-12-22: 15:00:00 via INTRAVENOUS

## 2017-12-22 MED ORDER — ONDANSETRON HCL 4 MG/2ML IJ SOLN: 4.0000 mg | Freq: Four times a day (QID) | INTRAMUSCULAR | Status: DC | PRN

## 2017-12-22 MED ORDER — GUAIFENESIN ER 600 MG PO TB12
600.0000 mg | ORAL_TABLET | Freq: Two times a day (BID) | ORAL | Status: DC
  Administered 2017-12-22 – 2017-12-23 (×2): 600 mg via ORAL
  Filled 2017-12-22 (×2): qty 1

## 2017-12-22 MED ORDER — ACETAMINOPHEN 325 MG PO TABS
650.0000 mg | ORAL_TABLET | ORAL | Status: DC | PRN
  Administered 2017-12-23 (×2): 650 mg via ORAL
  Filled 2017-12-22 (×2): qty 2

## 2017-12-22 MED ORDER — ATORVASTATIN CALCIUM 40 MG PO TABS
40.0000 mg | ORAL_TABLET | Freq: Every day | ORAL | Status: DC
  Administered 2017-12-22 – 2017-12-23 (×2): 40 mg via ORAL
  Filled 2017-12-22 (×2): qty 1

## 2017-12-22 MED ORDER — GABAPENTIN 300 MG PO CAPS
300.0000 mg | ORAL_CAPSULE | Freq: Every day | ORAL | Status: DC
  Administered 2017-12-22: 300 mg via ORAL
  Filled 2017-12-22: qty 1

## 2017-12-22 MED ORDER — INSULIN ASPART 100 UNIT/ML ~~LOC~~ SOLN: 0.0000 [IU] | Freq: Every day | SUBCUTANEOUS | Status: DC

## 2017-12-22 MED ORDER — INSULIN ASPART 100 UNIT/ML ~~LOC~~ SOLN: 0.0000 [IU] | Freq: Three times a day (TID) | SUBCUTANEOUS | Status: DC

## 2017-12-22 NOTE — H&P (Addendum)
History and Physical    [name] ZOX:[id number] DOB: 10/01/1965 DOA: 12/22/2017  PCP: Bridget Hartshorn, DO Consultants:  none Patient coming from: home- lives alone  Chief Complaint: Near syncope  HPI: [name] is a 52 y.o. female with medical history significant for DM2, neuropathy, HTN, HLD, depression, who presented to the ED today with c/o lightheadedness.  She said that she was walking around in [place] this morning and had the acute onset of lightheadedness where she had to sit down or else felt like she was going to faint.  She called a cab to take her to the ED and states that she took her morning dose of Coreg while in route.  She denies any chest pain, palpitations.  She has had no headache, double vision.  No nausea, vomiting, diarrhea.  She has not had any shortness of breath or cough.  No fever or chills.  She has not had any sick contacts.  The patient's husband passed away 3 months ago at [facility] and patient states that she has "stopped taking care of herself since then." (She also lost custody of her 2 teenage daughters for unclear reasons but expects they will come back to live with her in January after her court date).  She states that she has been profoundly lonely, depressed, isolating since her husband died. Has no family or friends around. Says she eats a small breakfast and coffee in the morning and then sometimes does not eat anything else for the rest of the day. She does say that she has been drinking most days in the past 3 months but is unable to quantify how much alcohol. She drank a six pack of beer over the weekend.  She does state that she has been compliant with her medications. She says that 3 months ago she was told that she had "broken heart syndrome" when she was admitted to the hospital shortly after her husband died.  It appears that she had an ED encounter in August for shortness of breath, did not have an echo, EKG was normal.  Was  felt to be 2/2 anxiety.   ED Course: Upon arrival to the ED her blood pressure was 77/51.  Heart rate was 78, satting well on RA. Afebrile. Diaphoretic. BG 150s. Mg 1.4, D-dimer elevated, CTA neg for PE, Dopplers neg for DVT bilaterally. EKG OK. Creat 1.13. Na 125. Cl 89. CXR clear.  Review of Systems: +urinary urgency, no dysuria but is on pyridium; otherwise as per HPI; otherwise review of systems reviewed and negative.   Ambulatory Status:  Ambulates without assistance  Past Medical History:  Diagnosis Date  . Anemia    when in late teens  . Anxiety   . Breast abscess   . Chlamydia   . Depression    PTSD also  . Diabetes mellitus without complication (HCC)   . Foot pain, bilateral    "burning"  . Gonorrhea   . High cholesterol   . Hypercholesteremia   . Hypertension   . Recurrent upper respiratory infection (URI)    URI/SOB-treated with antibiotics-cleared up now  . Seizures (HCC)   . Substance abuse (HCC)    alcohol  . Tuberculosis    was treated    Past Surgical History:  Procedure Laterality Date  . BREAST BIOPSY  11/13/2010  . BREAST LUMPECTOMY    . INCISE AND DRAIN ABCESS  01/03/11   breast abscess  . TONGUE SURGERY  2008   lump  removed    Social History   Socioeconomic History  . Marital status: Married    Spouse name: Not on file  . Number of children: Not on file  . Years of education: GED 36  . Highest education level: Not on file  Occupational History  . Occupation: unemployed  Social Needs  . Financial resource strain: Not on file  . Food insecurity:    Worry: Not on file    Inability: Not on file  . Transportation needs:    Medical: Not on file    Non-medical: Not on file  Tobacco Use  . Smoking status: Current Some Day Smoker    Packs/day: 1.00    Types: Cigarettes    Last attempt to quit: 12/11/2012    Years since quitting: 5.0  . Smokeless tobacco: Never Used  . Tobacco comment: 3 cigs per day  Substance and Sexual Activity  .  Alcohol use: Yes    Comment: Beer on weekends  . Drug use: No  . Sexual activity: Not on file  Lifestyle  . Physical activity:    Days per week: Not on file    Minutes per session: Not on file  . Stress: Not on file  Relationships  . Social connections:    Talks on phone: Not on file    Gets together: Not on file    Attends religious service: Not on file    Active member of club or organization: Not on file    Attends meetings of clubs or organizations: Not on file    Relationship status: Not on file  . Intimate partner violence:    Fear of current or ex partner: Not on file    Emotionally abused: Not on file    Physically abused: Not on file    Forced sexual activity: Not on file  Other Topics Concern  . Not on file  Social History Narrative  . Not on file    No Known Allergies  Family History  Problem Relation Age of Onset  . Breast cancer Mother   . Breast cancer Sister   . Breast cancer Maternal Aunt     Prior to Admission medications   Medication Sig Start Date End Date Taking? Authorizing Provider  atorvastatin (LIPITOR) 40 MG tablet Take 1 tablet (40 mg total) by mouth daily. IM Program. 07/09/17 12/22/17 Yes Molt, Bethany, DO  carvedilol (COREG) 6.25 MG tablet Take 1 tablet (6.25 mg total) by mouth 2 (two) times daily. Patient taking differently: Take 6.25 mg by mouth daily.  07/09/17 01/05/18 Yes Molt, Bethany, DO  gabapentin (NEURONTIN) 300 MG capsule Take 1 capsule (300 mg total) by mouth at bedtime. 07/09/17  Yes Molt, Bethany, DO  guaiFENesin (MUCINEX) 600 MG 12 hr tablet Take 600 mg by mouth 2 (two) times daily.   Yes [provider]  lisinopril (PRINIVIL,ZESTRIL) 40 MG tablet Take 1 tablet (40 mg total) by mouth daily. 03/04/17  Yes Molt, Bethany, DO  metFORMIN (GLUCOPHAGE) 1000 MG tablet Take 1 tablet (1,000 mg total) by mouth 2 (two) times daily with a meal. 04/28/17  Yes Molt, Bethany, DO  naproxen sodium (ALEVE) 220 MG tablet Take 440 mg by mouth  daily as needed (pain).   Yes [provider]  Phenazopyridine HCl (AZO TABS PO) Take 2 tablets by mouth daily as needed (bladder pain).   Yes [provider]  cetirizine (ZYRTEC ALLERGY) 10 MG tablet Take 1 tablet (10 mg total) by mouth daily. Patient not taking:  Reported on 09/17/2017 05/31/17   Emi HolesLaw, Alexandra M, PA-C  hydrOXYzine (ATARAX/VISTARIL) 25 MG tablet TAKE 1 TABLET BY MOUTH ONCE DAILY AS NEEDED FOR ITCHING (BLADDER  PAIN) Patient not taking: No sig reported 09/03/17   Molt, HickoryBethany, DO    Physical Exam: Vitals:   12/22/17 1400 12/22/17 1415 12/22/17 1423 12/22/17 1430  BP: 125/84 (!) 144/84 (!) 144/84 130/90  Pulse: 67 61 67 66  Resp: 18 16 18 17   Temp:      TempSrc:      SpO2: 99% 96% 95% 95%     . General: Appears older than stated age, tearful and anxious . Eyes:  PERRL, EOMI, normal lids, iris . ENT:  grossly normal hearing, lips & tongue, mmm . Neck:  supple, no lymphadenopathy . Cardiovascular:  nL S1, S2, normal rate, reg rhythm, no murmur. Marland Kitchen. Respiratory:   CTA bilaterally with no wheezes/rales/rhonchi.  Normal respiratory effort. . Abdomen:  soft, NT, ND, NABS . Back:   grossly normal alignment . Skin:  no rash or lesions seen on limited exam . Musculoskeletal:  grossly normal tone BUE/BLE, good ROM, no bony abnormality or obvious joint deformity . Lower extremities:  No LE edema.  Limited foot exam with no ulcerations.  2+ distal pulses. Marland Kitchen. Psychiatric:  grossly normal mood and affect, speech fluent and appropriate, AOx3 . Neurologic:  CN 2-12 grossly intact, moves all extremities in coordinated fashion, sensation intact, Patellar DTRs 2+ and symmetric    Radiological Exams on Admission: Ct Angio Chest Pe W/cm &/or Wo Cm  Result Date: 12/22/2017 CLINICAL DATA:  Near syncope, positive D-dimer level. EXAM: CT ANGIOGRAPHY CHEST WITH CONTRAST TECHNIQUE: Multidetector CT imaging of the chest was performed using the standard protocol during bolus  administration of intravenous contrast. Multiplanar CT image reconstructions and MIPs were obtained to evaluate the vascular anatomy. CONTRAST:  100mL ISOVUE-370 IOPAMIDOL (ISOVUE-370) INJECTION 76% COMPARISON:  Radiograph of same day. FINDINGS: Cardiovascular: Satisfactory opacification of the pulmonary arteries to the segmental level. No evidence of pulmonary embolism. Normal heart size. No pericardial effusion. Mediastinum/Nodes: No enlarged mediastinal, hilar, or axillary lymph nodes. Thyroid gland, trachea, and esophagus demonstrate no significant findings. Lungs/Pleura: Lungs are clear. No pleural effusion or pneumothorax. Upper Abdomen: No acute abnormality. Musculoskeletal: No chest wall abnormality. No acute or significant osseous findings. Review of the MIP images confirms the above findings. IMPRESSION: No definite evidence of pulmonary embolus. No definite abnormality seen in the chest. Electronically Signed   By: Lupita RaiderJames  Green Jr, M.D.   On: 12/22/2017 14:16   Dg Chest Port 1 View  Result Date: 12/22/2017 CLINICAL DATA:  Chest pain EXAM: PORTABLE CHEST 1 VIEW COMPARISON:  09/17/2017 FINDINGS: The heart size and mediastinal contours are within normal limits. Both lungs are clear. The visualized skeletal structures are unremarkable. IMPRESSION: No active disease. Electronically Signed   By: Alcide CleverMark  Lukens M.D.   On: 12/22/2017 12:02   [place] Lower Extremity Venous (dvt) (only Mc & Wl 7a-7p)  Result Date: 12/22/2017  Lower Venous Study Indications: Pain.  Performing Technologist: Gertie FeyMichelle Simonetti MHA, RDMS, RVT, RDCS  Examination Guidelines: A complete evaluation includes B-mode imaging, spectral Doppler, color Doppler, and power Doppler as needed of all accessible portions of each vessel. Bilateral testing is considered an integral part of a complete examination. Limited examinations for reoccurring indications may be performed as noted.  Right Venous Findings:  +---------+---------------+---------+-----------+----------+-------+          CompressibilityPhasicitySpontaneityPropertiesSummary +---------+---------------+---------+-----------+----------+-------+ CFV      Full  Yes      Yes                          +---------+---------------+---------+-----------+----------+-------+ SFJ      Full                                                 +---------+---------------+---------+-----------+----------+-------+ FV Prox  Full                                                 +---------+---------------+---------+-----------+----------+-------+ FV Mid   Full                                                 +---------+---------------+---------+-----------+----------+-------+ FV DistalFull                                                 +---------+---------------+---------+-----------+----------+-------+ PFV      Full                                                 +---------+---------------+---------+-----------+----------+-------+ POP      Full           Yes      Yes                          +---------+---------------+---------+-----------+----------+-------+ PTV      Full                                                 +---------+---------------+---------+-----------+----------+-------+ PERO     Full                                                 +---------+---------------+---------+-----------+----------+-------+  Left Venous Findings: +---------+---------------+---------+-----------+----------+-------+          CompressibilityPhasicitySpontaneityPropertiesSummary +---------+---------------+---------+-----------+----------+-------+ CFV      Full           Yes      Yes                          +---------+---------------+---------+-----------+----------+-------+ SFJ      Full                                                  +---------+---------------+---------+-----------+----------+-------+ FV Prox  Full                                                 +---------+---------------+---------+-----------+----------+-------+  FV Mid   Full                                                 +---------+---------------+---------+-----------+----------+-------+ FV DistalFull                                                 +---------+---------------+---------+-----------+----------+-------+ PFV      Full                                                 +---------+---------------+---------+-----------+----------+-------+ POP      Full           Yes      Yes                          +---------+---------------+---------+-----------+----------+-------+ PTV      Full                                                 +---------+---------------+---------+-----------+----------+-------+ PERO     Full                                                 +---------+---------------+---------+-----------+----------+-------+    Summary: Right: There is no evidence of deep vein thrombosis in the lower extremity. No cystic structure found in the popliteal fossa. Left: There is no evidence of deep vein thrombosis in the lower extremity. No cystic structure found in the popliteal fossa.  *See table(s) above for measurements and observations.    Preliminary     EKG: Independently reviewed.   Date/Time:                  Monday December 22 2017 12:09:57 EST Ventricular Rate:         69 PR Interval:                   QRS Duration: 96 QT Interval:                 542 QTC Calculation:        581 R Axis:                         54 Text Interpretation:       Sinus rhythm Abnrm T, probable ischemia, anterolateral lds Prolonged QT interval When compared with ECG of 09/17/2017 QT has lengthened Anterior leads T wave abnormality is more pronounced Lateral leads T wave abnormality is now Present   Labs on Admission: I have  personally reviewed the available labs and imaging studies at the time of the admission.  Pertinent labs:  Sodium 125, last checked was 133 in August. Potassium 3.9 chloride 89 CO2 23 glucose 139 BUN 11 creatinine 1.13, baseline 0.6, calcium 9.4, magnesium 1.4 AST  58 ALT 57, otherwise LFTs within normal limits Troponin less than 0.03 Lactic acid 1.9 WBC 7.8 hemoglobin 13 platelets 571 Urine drug screen pending D-dimer 3.74 Acetaminophen less than 10, salicylate less than 7    Assessment/Plan Principal Problem:   Hypotension Active Problems:   Hypertension   Type 2 diabetes mellitus with microalbuminuria, without long-term current use of insulin (HCC)   Depression   Hyperlipidemia   Alcohol dependence (HCC)   Diabetic neuropathy, painful (HCC)   Hyponatremia   Near syncope   AKI (acute kidney injury) (HCC)   Postural dizziness with presyncope   Hypotension, Near Syncope: likely 2/2 orthostatic hypotension from volume depletion from inadequate oral intake. No focal findings on exam, no s/s cardiac ischemia. No s/s sepsis. -Cont IVF -repeat BMP in a.m.  AKI likely 2/2 volume depletion / prerenal with concomitant use of NSAIDs. Also with some urinary urgency, could have UTI. Hypovolemic hyponatremia. -IVF as above -daily BMP -f/u U/A -have instructed pt not to take aleve -hold ACE  Alcohol dependence -no s/s withdrawal currently -CIWA  Hypomagnesemia -repleting with 2g MgSO4  DM2 -hold MTF -SSI -cont neurontin for neuropathy  HLD -cont lipitor  Depression -exacerbated by husband's death. Worsening. Needs grief counseling. Will ask SW to see. Also pastoral care. Pt is amenable to starting an antidepressant. Would strongly consider starting one at discharge.     DVT prophylaxis: lovenox Code Status:  Full - confirmed with patient/family Family Communication:   Disposition Plan:  Home once clinically improved Consults called: SW, pastoral care  Admission  status: It is my clinical opinion that referral for OBSERVATION is reasonable and necessary in this patient based on the above information provided. The aforementioned taken together are felt to place the patient at high risk for further clinical deterioration. However it is anticipated that the patient may be medically stable for discharge from the hospital within 24 to 48 hours.     Elyse Hsu MD [facility]  If note is complete, please contact covering daytime or nighttime physician. [email or web address] Password [email or web address]  12/22/2017, 2:37 PM

## 2017-12-22 NOTE — Progress Notes (Signed)
Bilateral lower extremity venous duplex completed. See "CV Proc" under chart review for preliminary results.   12/22/2017 12:42 PM Gertie FeyMichelle Orel Hord, MHA, RVT, RDCS, RDMS

## 2017-12-22 NOTE — Plan of Care (Signed)
Noted U/A suggestive of UTI. Given symptoms (urgency) will start cipro 250 mg BID. Urine culture pending.

## 2017-12-22 NOTE — ED Provider Notes (Signed)
[facility]   CSN: [id number] Arrival date & time: 12/22/17  1113     History   Chief Complaint Chief Complaint  Patient presents with  . Near Syncope  . Hypotension    HPI [name] is a 52 y.o. female.  HPI Pt was seen at 1140. Per pt, c/o sudden onset and persistence of constant "lightheadedness" that began while in a store shopping PTA. Pt states she "felt like I was going to pass out." Was associated with diaphoresis. Pt states she checked her CBG and it was "159." Pt states she took a cab here and took her carvedilol because she was due for her morning dose. States she has been taking OTC alleve and applying "diabetic cream" to her bilateral calf areas for the past 2 to 3 days because "they started to hurt." Endorses hx of chronic bilat feet burning pain.  Denies any recent changes in medications. Denies CP/palpitations, no SOB/cough, no abd pain, no N/V/D, no focal motor weakness, no tingling/numbness in extremities, no back pain, no facial droop, no slurred speech, no syncope, no headache, no visual changes.    Past Medical History:  Diagnosis Date  . Anemia    when in late teens  . Anxiety   . Breast abscess   . Chlamydia   . Depression    PTSD also  . Diabetes mellitus without complication (HCC)   . Foot pain, bilateral    "burning"  . Gonorrhea   . High cholesterol   . Hypercholesteremia   . Hypertension   . Recurrent upper respiratory infection (URI)    URI/SOB-treated with antibiotics-cleared up now  . Seizures (HCC)   . Substance abuse (HCC)    alcohol  . Tuberculosis    was treated    Patient Active Problem List   Diagnosis Date Noted  . Healthcare maintenance 11/12/2016  . Occipital neuralgia of left side 06/21/2016  . Dysuria 05/21/2016  . Avascular necrosis (HCC) 03/01/2016  . Chronic hyponatremia 08/29/2015  . Tobacco use disorder 04/12/2015  . Abnormal brain MRI 03/10/2015  .  Elevated liver enzymes 04/14/2013  . Diabetic neuropathy, painful (HCC) 04/14/2013  . Alcohol dependence (HCC) 03/23/2013  . Substance induced mood disorder (HCC) 03/22/2013  . Depression 02/05/2012  . Hyperlipidemia 02/05/2012  . Hypertension 01/07/2012  . Type 2 diabetes mellitus with microalbuminuria, without long-term current use of insulin (HCC) 01/07/2012    Past Surgical History:  Procedure Laterality Date  . BREAST BIOPSY  11/13/2010  . BREAST LUMPECTOMY    . INCISE AND DRAIN ABCESS  01/03/11   breast abscess  . TONGUE SURGERY  2008   lump removed     OB History    Gravida  2   Para  2   Term  2   Preterm      AB      Living  2     SAB      TAB      Ectopic      Multiple      Live Births               Home Medications    Prior to Admission medications   Medication Sig Start Date End Date Taking? Authorizing Provider  amLODipine (NORVASC) 10 MG tablet Take 10 mg by mouth daily. 07/09/17   [provider]  atorvastatin (LIPITOR) 40 MG tablet Take 1 tablet (40 mg total) by mouth daily. IM Program. 07/09/17  09/17/17  Molt, Bethany, DO  carvedilol (COREG) 6.25 MG tablet Take 1 tablet (6.25 mg total) by mouth 2 (two) times daily. Patient taking differently: Take 6.25 mg by mouth daily.  07/09/17 01/05/18  Molt, Toma Copier, DO  cetirizine (ZYRTEC ALLERGY) 10 MG tablet Take 1 tablet (10 mg total) by mouth daily. Patient not taking: Reported on 09/17/2017 05/31/17   Emi Holes, PA-C  gabapentin (NEURONTIN) 300 MG capsule Take 1 capsule (300 mg total) by mouth at bedtime. 07/09/17   Molt, Bethany, DO  hydrOXYzine (ATARAX/VISTARIL) 25 MG tablet TAKE 1 TABLET BY MOUTH ONCE DAILY AS NEEDED FOR ITCHING (BLADDER  PAIN) 09/03/17   Molt, Bethany, DO  lisinopril (PRINIVIL,ZESTRIL) 40 MG tablet Take 1 tablet (40 mg total) by mouth daily. 03/04/17   Molt, Bethany, DO  metFORMIN (GLUCOPHAGE) 1000 MG tablet Take 1 tablet (1,000 mg total) by mouth 2 (two) times  daily with a meal. 04/28/17   Molt, Bethany, DO  Phenazopyridine HCl (AZO TABS PO) Take 2 tablets by mouth daily as needed (bladder pain).    [provider]    Family History Family History  Problem Relation Age of Onset  . Breast cancer Mother   . Breast cancer Sister   . Breast cancer Maternal Aunt     Social History Social History   Tobacco Use  . Smoking status: Current Some Day Smoker    Packs/day: 1.00    Types: Cigarettes    Last attempt to quit: 12/11/2012    Years since quitting: 5.0  . Smokeless tobacco: Never Used  . Tobacco comment: 3 cigs per day  Substance Use Topics  . Alcohol use: Yes    Comment: Beer on weekends  . Drug use: No     Allergies   Patient has no known allergies.   Review of Systems Review of Systems ROS: Statement: All systems negative except as marked or noted in the HPI; Constitutional: Negative for fever and chills. ; ; Eyes: Negative for eye pain, redness and discharge. ; ; ENMT: Negative for ear pain, hoarseness, nasal congestion, sinus pressure and sore throat. ; ; Cardiovascular: Negative for chest pain, palpitations, dyspnea and peripheral edema. ; ; Respiratory: Negative for cough, wheezing and stridor. ; ; Gastrointestinal: Negative for nausea, vomiting, diarrhea, abdominal pain, blood in stool, hematemesis, jaundice and rectal bleeding. . ; ; Genitourinary: Negative for dysuria, flank pain and hematuria. ; ; Musculoskeletal: +calf pain bilat. Negative for back pain and neck pain. Negative for swelling and trauma.; ; Skin: +diaphoresis. Negative for pruritus, rash, abrasions, blisters, bruising and skin lesion.; ; Neuro: +lightheadedness/near syncope. Negative for headache and neck stiffness. Negative for altered level of consciousness, altered mental status, extremity weakness, paresthesias, involuntary movement, seizure and syncope.       Physical Exam Updated Vital Signs BP (!) 77/51 (BP Location: Right Arm)    Patient  Vitals for the past 24 hrs:  BP Temp Temp src Pulse Resp SpO2  12/22/17 1430 130/90 - - 66 17 95 %  12/22/17 1423 (!) 144/84 - - 67 18 95 %  12/22/17 1415 (!) 144/84 - - 61 16 96 %  12/22/17 1400 125/84 - - 67 18 99 %  12/22/17 1345 122/77 - - 65 19 98 %  12/22/17 1330 113/72 - - 62 15 95 %  12/22/17 1324 - 98.2 F (36.8 C) Oral - - -  12/22/17 1315 106/71 - - 71 19 95 %  12/22/17 1300 110/83 - - 62 13 96 %  12/22/17 1230 (!) 76/62 - - 67 17 98 %  12/22/17 1215 99/64 - - 70 (!) 22 96 %  12/22/17 1200 (!) 84/70 - - 70 18 95 %  12/22/17 1145 (!) 78/59 - - 77 17 97 %  12/22/17 1131 (!) 77/51 - - - - -  12/22/17 1130 (!) 77/51 - - 77 19 96 %     Physical Exam 1145: Physical examination:  Nursing notes reviewed; Vital signs and O2 SAT reviewed;  Constitutional: Well developed, Well nourished, Well hydrated, In no acute distress; Head:  Normocephalic, atraumatic; Eyes: EOMI, PERRL, No scleral icterus; ENMT: Mouth and pharynx normal, Mucous membranes moist; Neck: Supple, Full range of motion, No lymphadenopathy; Cardiovascular: Regular rate and rhythm, No gallop; Respiratory: Breath sounds clear & equal bilaterally, No wheezes.  Speaking full sentences with ease, Normal respiratory effort/excursion; Chest: Nontender, Movement normal; Abdomen: Soft, Nontender, Nondistended, Normal bowel sounds; Genitourinary: No CVA tenderness; Extremities: Peripheral pulses normal, No tenderness, No edema, No calf tenderness, edema or asymmetry.; Neuro: AA&Ox3, Major CN grossly intact. No facial droop. Speech clear. No gross focal motor or sensory deficits in extremities.; Skin: Color normal, Warm, Diaphoretic.    ED Treatments / Results  Labs (all labs ordered are listed, but only abnormal results are displayed)   EKG EKG Interpretation  Date/Time:  Monday December 22 2017 12:09:57 EST Ventricular Rate:  69 PR Interval:    QRS Duration: 96 QT Interval:  542 QTC Calculation: 581 R Axis:   54 Text  Interpretation:  Sinus rhythm Abnrm T, probable ischemia, anterolateral lds Prolonged QT interval When compared with ECG of 09/17/2017 QT has lengthened Anterior leads T wave abnormality is more pronounced Lateral leads T wave abnormality is now Present Confirmed by Samuel Jester [phone]) on 12/22/2017 12:20:33 PM Also confirmed by Samuel Jester [phone]), editor Josephine Igo ([id number])  on 12/22/2017 2:21:55 PM   Radiology   Procedures Procedures (including critical care time)  Medications Ordered in ED Medications  sodium chloride 0.9 % bolus 1,000 mL (has no administration in time range)     Initial Impression / Assessment and Plan / ED Course  I have reviewed the triage vital signs and the nursing notes.  Pertinent labs & imaging results that were available during my care of the patient were reviewed by me and considered in my medical decision making (see chart for details).  MDM Reviewed: previous chart, nursing note and vitals Reviewed previous: labs and ECG Interpretation: labs, ECG, x-ray, ultrasound and CT scan Total time providing critical care: 30-74 minutes. This excludes time spent performing separately reportable procedures and services. Consults: admitting MD    CRITICAL CARE Performed by: Samuel Jester Total critical care time: 45 minutes Critical care time was exclusive of separately billable procedures and treating other patients. Critical care was necessary to treat or prevent imminent or life-threatening deterioration. Critical care was time spent personally by me on the following activities: development of treatment plan with patient and/or surrogate as well as nursing, discussions with consultants, evaluation of patient's response to treatment, examination of patient, obtaining history from patient or surrogate, ordering and performing treatments and interventions, ordering and review of laboratory studies, ordering and review of radiographic studies,  pulse oximetry and re-evaluation of patient's condition.   Results for orders placed or performed during the hospital encounter of 12/22/17  Comprehensive metabolic panel  Result Value Ref Range   Sodium 125 (L) 135 - 145 mmol/L   Potassium 3.9 3.5 - 5.1 mmol/L  Chloride 89 (L) 98 - 111 mmol/L   CO2 23 22 - 32 mmol/L   Glucose, Bld 139 (H) 70 - 99 mg/dL   BUN 11 6 - 20 mg/dL   Creatinine, Ser 1.61 (H) 0.44 - 1.00 mg/dL   Calcium 9.4 8.9 - 09.6 mg/dL   Total Protein 7.4 6.5 - 8.1 g/dL   Albumin 3.9 3.5 - 5.0 g/dL   AST 58 (H) 15 - 41 U/L   ALT 57 (H) 0 - 44 U/L   Alkaline Phosphatase 86 38 - 126 U/L   Total Bilirubin 1.0 0.3 - 1.2 mg/dL   GFR calc non Af Amer 56 (L) >60 mL/min   GFR calc Af Amer >60 >60 mL/min   Anion gap 13 5 - 15  Acetaminophen level  Result Value Ref Range   Acetaminophen (Tylenol), Serum <10 (L) 10 - 30 ug/mL  Ethanol  Result Value Ref Range   Alcohol, Ethyl (B) <10 <10 mg/dL  Salicylate level  Result Value Ref Range   Salicylate Lvl <7.0 2.8 - 30.0 mg/dL  Troponin I - Once  Result Value Ref Range   Troponin I <0.03 <0.03 ng/mL  CBC with Differential  Result Value Ref Range   WBC 7.8 4.0 - 10.5 K/uL   RBC 5.04 3.87 - 5.11 MIL/uL   Hemoglobin 13.0 12.0 - 15.0 g/dL   HCT 04.5 40.9 - 81.1 %   MCV 80.0 80.0 - 100.0 fL   MCH 25.8 (L) 26.0 - 34.0 pg   MCHC 32.3 30.0 - 36.0 g/dL   RDW 91.4 (H) 78.2 - 95.6 %   Platelets 571 (H) 150 - 400 K/uL   nRBC 0.0 0.0 - 0.2 %   Neutrophils Relative % 70 %   Neutro Abs 5.5 1.7 - 7.7 K/uL   Lymphocytes Relative 22 %   Lymphs Abs 1.7 0.7 - 4.0 K/uL   Monocytes Relative 5 %   Monocytes Absolute 0.4 0.1 - 1.0 K/uL   Eosinophils Relative 3 %   Eosinophils Absolute 0.2 0.0 - 0.5 K/uL   Basophils Relative 0 %   Basophils Absolute 0.0 0.0 - 0.1 K/uL   Immature Granulocytes 0 %   Abs Immature Granulocytes 0.02 0.00 - 0.07 K/uL  D-dimer, quantitative  Result Value Ref Range   D-Dimer, Quant 3.74 (H) 0.00 - 0.50  ug/mL-FEU  Lactic acid, plasma  Result Value Ref Range   Lactic Acid, Venous 1.9 0.5 - 1.9 mmol/L  Magnesium  Result Value Ref Range   Magnesium 1.4 (L) 1.7 - 2.4 mg/dL   Ct Angio Chest Pe W/cm &/or Wo Cm Result Date: 12/22/2017 CLINICAL DATA:  Near syncope, positive D-dimer level. EXAM: CT ANGIOGRAPHY CHEST WITH CONTRAST TECHNIQUE: Multidetector CT imaging of the chest was performed using the standard protocol during bolus administration of intravenous contrast. Multiplanar CT image reconstructions and MIPs were obtained to evaluate the vascular anatomy. CONTRAST:  ISOVUE-370 IOPAMIDOL (ISOVUE-370) INJECTION 76% COMPARISON:  Radiograph of same day. FINDINGS: Cardiovascular: Satisfactory opacification of the pulmonary arteries to the segmental level. No evidence of pulmonary embolism. Normal heart size. No pericardial effusion. Mediastinum/Nodes: No enlarged mediastinal, hilar, or axillary lymph nodes. Thyroid gland, trachea, and esophagus demonstrate no significant findings. Lungs/Pleura: Lungs are clear. No pleural effusion or pneumothorax. Upper Abdomen: No acute abnormality. Musculoskeletal: No chest wall abnormality. No acute or significant osseous findings. Review of the MIP images confirms the above findings. IMPRESSION: No definite evidence of pulmonary embolus. No definite abnormality seen in the chest.  Electronically Signed   By: Lupita Raider, M.D.   On: 12/22/2017 14:16   Dg Chest Port 1 View Result Date: 12/22/2017 CLINICAL DATA:  Chest pain EXAM: PORTABLE CHEST 1 VIEW COMPARISON:  09/17/2017 FINDINGS: The heart size and mediastinal contours are within normal limits. Both lungs are clear. The visualized skeletal structures are unremarkable. IMPRESSION: No active disease. Electronically Signed   By: Alcide Clever M.D.   On: 12/22/2017 12:02   Vas [place] Lower Extremity Venous (dvt) (only Mc & Wl 7a-7p) Result Date: 12/22/2017  Lower Venous Study Indications: Pain.  Performing  Technologist: Gertie Fey MHA, RDMS, RVT, RDCS  Examination Guidelines: A complete evaluation includes B-mode imaging, spectral Doppler, color Doppler, and power Doppler as needed of all accessible portions of each vessel. Bilateral testing is considered an integral part of a complete examination. Limited examinations for reoccurring indications may be performed as noted.  Right Venous Findings: +---------+---------------+---------+-----------+----------+-------+          CompressibilityPhasicitySpontaneityPropertiesSummary +---------+---------------+---------+-----------+----------+-------+ CFV      Full           Yes      Yes                          +---------+---------------+---------+-----------+----------+-------+ SFJ      Full                                                 +---------+---------------+---------+-----------+----------+-------+ FV Prox  Full                                                 +---------+---------------+---------+-----------+----------+-------+ FV Mid   Full                                                 +---------+---------------+---------+-----------+----------+-------+ FV DistalFull                                                 +---------+---------------+---------+-----------+----------+-------+ PFV      Full                                                 +---------+---------------+---------+-----------+----------+-------+ POP      Full           Yes      Yes                          +---------+---------------+---------+-----------+----------+-------+ PTV      Full                                                 +---------+---------------+---------+-----------+----------+-------+ PERO  Full                                                 +---------+---------------+---------+-----------+----------+-------+  Left Venous Findings: +---------+---------------+---------+-----------+----------+-------+           CompressibilityPhasicitySpontaneityPropertiesSummary +---------+---------------+---------+-----------+----------+-------+ CFV      Full           Yes      Yes                          +---------+---------------+---------+-----------+----------+-------+ SFJ      Full                                                 +---------+---------------+---------+-----------+----------+-------+ FV Prox  Full                                                 +---------+---------------+---------+-----------+----------+-------+ FV Mid   Full                                                 +---------+---------------+---------+-----------+----------+-------+ FV DistalFull                                                 +---------+---------------+---------+-----------+----------+-------+ PFV      Full                                                 +---------+---------------+---------+-----------+----------+-------+ POP      Full           Yes      Yes                          +---------+---------------+---------+-----------+----------+-------+ PTV      Full                                                 +---------+---------------+---------+-----------+----------+-------+ PERO     Full                                                 +---------+---------------+---------+-----------+----------+-------+    Summary: Right: There is no evidence of deep vein thrombosis in the lower extremity. No cystic structure found in the popliteal fossa. Left: There is no evidence of deep vein thrombosis in the lower extremity. No cystic structure found in the popliteal fossa.  *See table(s) above for measurements  and observations.    Preliminary     1430:  SBP 70's on arrival. IVF NS boluses given with slow improvement. New hyponatremia on labs and BUN/Cr elevated; will continue IVF gtt. CT-A chest and Vasc US bilat LE's reassuring. Magnesium repleted IV. Dx and testing d/w  pt.  Questions answered.  Verb understanding, agreeable to admit. T/C returned from [facility]. Morrison Old, case discussed, including:  HPI, pertinent PM/SHx, VS/PE, dx testing, ED course and treatment:  Agreeable to come to ED for evaluation to admit.     Final Clinical Impressions(s) / ED Diagnoses   Final diagnoses:  None    ED Discharge Orders    None       Samuel Jester, DO 12/26/17 [id number]

## 2017-12-22 NOTE — ED Triage Notes (Signed)
Pt reports she was out doing some Christmas shopping began feeling like she was going to pass out. Initially thought her blood sugar was low but checked it and it was 159. Pt denies recent changes to regular medications. BP currently 84/51. Pt awake, alert, appropriate, oriented x4, radial pulse palable. Pt diaphoretic.

## 2017-12-23 DIAGNOSIS — F1021 Alcohol dependence, in remission: Secondary | ICD-10-CM | POA: Diagnosis not present

## 2017-12-23 DIAGNOSIS — I950 Idiopathic hypotension: Secondary | ICD-10-CM | POA: Diagnosis not present

## 2017-12-23 DIAGNOSIS — F320 Major depressive disorder, single episode, mild: Secondary | ICD-10-CM

## 2017-12-23 LAB — BASIC METABOLIC PANEL
Anion gap: 11 (ref 5–15)
Anion gap: 9 (ref 5–15)
BUN: 5 mg/dL — ABNORMAL LOW (ref 6–20)
BUN: 5 mg/dL — ABNORMAL LOW (ref 6–20)
CHLORIDE: 93 mmol/L — AB (ref 98–111)
CO2: 24 mmol/L (ref 22–32)
CO2: 25 mmol/L (ref 22–32)
Calcium: 9.2 mg/dL (ref 8.9–10.3)
Calcium: 9.1 mg/dL (ref 8.9–10.3)
Chloride: 97 mmol/L — ABNORMAL LOW (ref 98–111)
Creatinine, Ser: 0.83 mg/dL (ref 0.44–1.00)
Creatinine, Ser: 0.73 mg/dL (ref 0.44–1.00)
GFR calc Af Amer: >60 mL/min (ref >60)
GFR calc Af Amer: >60 mL/min (ref >60)
GFR calc non Af Amer: >60 mL/min (ref >60)
GFR calc non Af Amer: >60 mL/min (ref >60)
Glucose, Bld: 93 mg/dL (ref 70–99)
Glucose, Bld: 97 mg/dL (ref 70–99)
Potassium: 4.1 mmol/L (ref 3.5–5.1)
Potassium: 4.2 mmol/L (ref 3.5–5.1)
Sodium: 132 mmol/L — ABNORMAL LOW (ref 135–145)
Sodium: 127 mmol/L — ABNORMAL LOW (ref 135–145)

## 2017-12-23 LAB — GLUCOSE, CAPILLARY
Glucose-Capillary: 94 mg/dL (ref 70–99)
Glucose-Capillary: 132 mg/dL — ABNORMAL HIGH (ref 70–99)

## 2017-12-23 LAB — CBC
HCT: 38 % (ref 36.0–46.0)
Hemoglobin: 12.1 g/dL (ref 12.0–15.0)
MCH: 25.6 pg — ABNORMAL LOW (ref 26.0–34.0)
MCHC: 31.8 g/dL (ref 30.0–36.0)
MCV: 80.5 fL (ref 80.0–100.0)
Platelets: 496 10*3/uL — ABNORMAL HIGH (ref 150–400)
RBC: 4.72 MIL/uL (ref 3.87–5.11)
RDW: 17.2 % — ABNORMAL HIGH (ref 11.5–15.5)
WBC: 6.1 10*3/uL (ref 4.0–10.5)
nRBC: 0 % (ref 0.0–0.2)

## 2017-12-23 LAB — TSH: TSH: 1.9 u[IU]/mL (ref 0.350–4.500)

## 2017-12-23 LAB — HIV ANTIBODY (ROUTINE TESTING W REFLEX): HIV Screen 4th Generation wRfx: NONREACTIVE

## 2017-12-23 MED ORDER — CEFDINIR 300 MG PO CAPS
300.0000 mg | ORAL_CAPSULE | Freq: Two times a day (BID) | ORAL | Status: DC
  Administered 2017-12-23: 300 mg via ORAL
  Filled 2017-12-23: qty 1

## 2017-12-23 MED ORDER — CEFDINIR 300 MG PO CAPS: 300.0000 mg | ORAL_CAPSULE | Freq: Two times a day (BID) | ORAL | 0 refills | Status: AC

## 2017-12-23 MED ORDER — LISINOPRIL 40 MG PO TABS: 40.0000 mg | ORAL_TABLET | Freq: Every day | ORAL | 2 refills | Status: DC

## 2017-12-23 MED ORDER — ENOXAPARIN SODIUM 40 MG/0.4ML ~~LOC~~ SOLN: 40.0000 mg | SUBCUTANEOUS | Status: DC

## 2017-12-23 NOTE — Discharge Summary (Signed)
Physician Discharge Summary  [name] MRN: [id number] DOB/AGE: Nov 23, 1965 52 y.o.  PCP: Lenward Chancellor D, DO   Admit date: 12/22/2017 Discharge date: 12/23/2017  Discharge Diagnoses:    Principal Problem:   Hypotension Active Problems:   Hypertension   Type 2 diabetes mellitus with microalbuminuria, without long-term current use of insulin (HCC)   Depression   Hyperlipidemia   Alcohol dependence (HCC)   Diabetic neuropathy, painful (HCC)   Hyponatremia   Near syncope   AKI (acute kidney injury) (HCC)   Postural dizziness with presyncope    Follow-up recommendations Follow-up with PCP in 3-5 days , including all  additional recommended appointments as below Follow-up CBC, CMP in 3-5 days Patient counseled about alcohol cessation, she declined continued admission for detox and stated that she does not withdraw from alcohol  She will need treatment for underlying depression through her PCP Instructed to resume lisinopril  next week if blood pressure is stable after  PCP   follow-up     Allergies as of 12/23/2017   No Known Allergies     Medication List    STOP taking these medications   naproxen sodium 220 MG tablet Commonly known as:  ALEVE     TAKE these medications   atorvastatin 40 MG tablet Commonly known as:  LIPITOR Take 1 tablet (40 mg total) by mouth daily. IM Program.   AZO TABS PO Take 2 tablets by mouth daily as needed (bladder pain).   carvedilol 6.25 MG tablet Commonly known as:  COREG Take 1 tablet (6.25 mg total) by mouth 2 (two) times daily. What changed:  when to take this   cefdinir 300 MG capsule Commonly known as:  OMNICEF Take 1 capsule (300 mg total) by mouth 2 (two) times daily for 3 days.   cetirizine 10 MG tablet Commonly known as:  ZYRTEC Take 1 tablet (10 mg total) by mouth daily.   gabapentin 300 MG capsule Commonly known as:  NEURONTIN Take 1 capsule (300 mg total) by mouth at bedtime.   guaiFENesin 600  MG 12 hr tablet Commonly known as:  MUCINEX Take 600 mg by mouth 2 (two) times daily.   hydrOXYzine 25 MG tablet Commonly known as:  ATARAX/VISTARIL TAKE 1 TABLET BY MOUTH ONCE DAILY AS NEEDED FOR ITCHING (BLADDER  PAIN)   lisinopril 40 MG tablet Commonly known as:  PRINIVIL,ZESTRIL Take 1 tablet (40 mg total) by mouth daily. Start taking on:  12/29/2017 What changed:  These instructions start on 12/29/2017. If you are unsure what to do until then, ask your doctor or other care provider.   metFORMIN 1000 MG tablet Commonly known as:  GLUCOPHAGE Take 1 tablet (1,000 mg total) by mouth 2 (two) times daily with a meal.        Discharge Condition:  stable  Discharge Instructions Get Medicines reviewed and adjusted: Please take all your medications with you for your next visit with your Primary MD  Please request your Primary MD to go over all hospital tests and procedure/radiological results at the follow up, please ask your Primary MD to get all Hospital records sent to his/her office.  If you experience worsening of your admission symptoms, develop shortness of breath, life threatening emergency, suicidal or homicidal thoughts you must seek medical attention immediately by calling 911 or calling your MD immediately  if symptoms less severe.  You must read complete instructions/literature along with all the possible adverse reactions/side effects for all the Medicines you take and that  have been prescribed to you. Take any new Medicines after you have completely understood and accpet all the possible adverse reactions/side effects.   Do not drive when taking Pain medications.   Do not take more than prescribed Pain, Sleep and Anxiety Medications  Special Instructions: If you have smoked or chewed Tobacco  in the last 2 yrs please stop smoking, stop any regular Alcohol  and or any Recreational drug use.  Wear Seat belts while driving.  Please note  You were cared for by a  hospitalist during your hospital stay. Once you are discharged, your primary care physician will handle any further medical issues. Please note that NO REFILLS for any discharge medications will be authorized once you are discharged, as it is imperative that you return to your primary care physician (or establish a relationship with a primary care physician if you do not have one) for your aftercare needs so that they can reassess your need for medications and monitor your lab values.     No Known Allergies    Disposition: Discharge disposition: 01-Home or Self Care        Consults: none    Significant Diagnostic Studies:  Ct Angio Chest Pe W/cm &/or Wo Cm  Result Date: 12/22/2017 CLINICAL DATA:  Near syncope, positive D-dimer level. EXAM: CT ANGIOGRAPHY CHEST WITH CONTRAST TECHNIQUE: Multidetector CT imaging of the chest was performed using the standard protocol during bolus administration of intravenous contrast. Multiplanar CT image reconstructions and MIPs were obtained to evaluate the vascular anatomy. CONTRAST:  ISOVUE-370 IOPAMIDOL (ISOVUE-370) INJECTION 76% COMPARISON:  Radiograph of same day. FINDINGS: Cardiovascular: Satisfactory opacification of the pulmonary arteries to the segmental level. No evidence of pulmonary embolism. Normal heart size. No pericardial effusion. Mediastinum/Nodes: No enlarged mediastinal, hilar, or axillary lymph nodes. Thyroid gland, trachea, and esophagus demonstrate no significant findings. Lungs/Pleura: Lungs are clear. No pleural effusion or pneumothorax. Upper Abdomen: No acute abnormality. Musculoskeletal: No chest wall abnormality. No acute or significant osseous findings. Review of the MIP images confirms the above findings. IMPRESSION: No definite evidence of pulmonary embolus. No definite abnormality seen in the chest. Electronically Signed   By: Lupita Raider, M.D.   On: 12/22/2017 14:16   Dg Chest Port 1 View  Result Date:  12/22/2017 CLINICAL DATA:  Chest pain EXAM: PORTABLE CHEST 1 VIEW COMPARISON:  09/17/2017 FINDINGS: The heart size and mediastinal contours are within normal limits. Both lungs are clear. The visualized skeletal structures are unremarkable. IMPRESSION: No active disease. Electronically Signed   By: Alcide Clever M.D.   On: 12/22/2017 12:02   Vas [place] Lower Extremity Venous (dvt) (only Mc & Wl 7a-7p)  Result Date: 12/22/2017  Lower Venous Study Indications: Pain.  Performing Technologist: Gertie Fey MHA, RDMS, RVT, RDCS  Examination Guidelines: A complete evaluation includes B-mode imaging, spectral Doppler, color Doppler, and power Doppler as needed of all accessible portions of each vessel. Bilateral testing is considered an integral part of a complete examination. Limited examinations for reoccurring indications may be performed as noted.  Right Venous Findings: +---------+---------------+---------+-----------+----------+-------+          CompressibilityPhasicitySpontaneityPropertiesSummary +---------+---------------+---------+-----------+----------+-------+ CFV      Full           Yes      Yes                          +---------+---------------+---------+-----------+----------+-------+ SFJ      Full                                                 +---------+---------------+---------+-----------+----------+-------+  FV Prox  Full                                                 +---------+---------------+---------+-----------+----------+-------+ FV Mid   Full                                                 +---------+---------------+---------+-----------+----------+-------+ FV DistalFull                                                 +---------+---------------+---------+-----------+----------+-------+ PFV      Full                                                 +---------+---------------+---------+-----------+----------+-------+ POP      Full            Yes      Yes                          +---------+---------------+---------+-----------+----------+-------+ PTV      Full                                                 +---------+---------------+---------+-----------+----------+-------+ PERO     Full                                                 +---------+---------------+---------+-----------+----------+-------+  Left Venous Findings: +---------+---------------+---------+-----------+----------+-------+          CompressibilityPhasicitySpontaneityPropertiesSummary +---------+---------------+---------+-----------+----------+-------+ CFV      Full           Yes      Yes                          +---------+---------------+---------+-----------+----------+-------+ SFJ      Full                                                 +---------+---------------+---------+-----------+----------+-------+ FV Prox  Full                                                 +---------+---------------+---------+-----------+----------+-------+ FV Mid   Full                                                 +---------+---------------+---------+-----------+----------+-------+  FV DistalFull                                                 +---------+---------------+---------+-----------+----------+-------+ PFV      Full                                                 +---------+---------------+---------+-----------+----------+-------+ POP      Full           Yes      Yes                          +---------+---------------+---------+-----------+----------+-------+ PTV      Full                                                 +---------+---------------+---------+-----------+----------+-------+ PERO     Full                                                 +---------+---------------+---------+-----------+----------+-------+    Summary: Right: There is no evidence of deep vein thrombosis in the lower extremity.  No cystic structure found in the popliteal fossa. Left: There is no evidence of deep vein thrombosis in the lower extremity. No cystic structure found in the popliteal fossa.  *See table(s) above for measurements and observations. Electronically signed by Fabienne Brunsharles Fields MD on 12/22/2017 at 5:40:50 PM.    Final     echocardiogram        Filed Weights   12/22/17 2014  Weight: 74.8 kg     Microbiology: No results found for this or any previous visit (from the past 240 hour(s)).     Blood Culture    Component Value Date/Time   SDES URINE, RANDOM 12/08/2014 1830   SPECREQUEST Immunocompromised 12/08/2014 1830   CULT >=100,000 COLONIES/mL ESCHERICHIA COLI 12/08/2014 1830   REPTSTATUS 12/11/2014 FINAL 12/08/2014 1830      Labs: Results for orders placed or performed during the hospital encounter of 12/22/17 (from the past 48 hour(s))  Comprehensive metabolic panel     Status: Abnormal   Collection Time: 12/22/17 11:44 AM  Result Value Ref Range   Sodium 125 (L) 135 - 145 mmol/L   Potassium 3.9 3.5 - 5.1 mmol/L   Chloride 89 (L) 98 - 111 mmol/L   CO2 23 22 - 32 mmol/L   Glucose, Bld 139 (H) 70 - 99 mg/dL   BUN 11 6 - 20 mg/dL   Creatinine, Ser 1.611.13 (H) 0.44 - 1.00 mg/dL   Calcium 9.4 8.9 - 09.610.3 mg/dL   Total Protein 7.4 6.5 - 8.1 g/dL   Albumin 3.9 3.5 - 5.0 g/dL   AST 58 (H) 15 - 41 U/L   ALT 57 (H) 0 - 44 U/L   Alkaline Phosphatase 86 38 - 126 U/L   Total Bilirubin 1.0 0.3 - 1.2 mg/dL   GFR calc non Af Amer 56 (L) >60 mL/min  GFR calc Af Amer >60 >60 mL/min   Anion gap 13 5 - 15    Comment: Performed at [facility], [address] [place], [place] [address]  Troponin I - Once     Status: None   Collection Time: 12/22/17 11:44 AM  Result Value Ref Range   Troponin I <0.03 <0.03 ng/mL    Comment: Performed at [facility], [address] [place], [place] [address]  CBC with Differential     Status: Abnormal   Collection Time: 12/22/17 11:44 AM   Result Value Ref Range   WBC 7.8 4.0 - 10.5 K/uL   RBC 5.04 3.87 - 5.11 MIL/uL   Hemoglobin 13.0 12.0 - 15.0 g/dL   HCT 91.4 78.2 - 95.6 %   MCV 80.0 80.0 - 100.0 fL   MCH 25.8 (L) 26.0 - 34.0 pg   MCHC 32.3 30.0 - 36.0 g/dL   RDW 21.3 (H) 08.6 - 57.8 %   Platelets 571 (H) 150 - 400 K/uL   nRBC 0.0 0.0 - 0.2 %   Neutrophils Relative % 70 %   Neutro Abs 5.5 1.7 - 7.7 K/uL   Lymphocytes Relative 22 %   Lymphs Abs 1.7 0.7 - 4.0 K/uL   Monocytes Relative 5 %   Monocytes Absolute 0.4 0.1 - 1.0 K/uL   Eosinophils Relative 3 %   Eosinophils Absolute 0.2 0.0 - 0.5 K/uL   Basophils Relative 0 %   Basophils Absolute 0.0 0.0 - 0.1 K/uL   Immature Granulocytes 0 %   Abs Immature Granulocytes 0.02 0.00 - 0.07 K/uL    Comment: Performed at [facility], [address], [place], [place] [address]  D-dimer, quantitative     Status: Abnormal   Collection Time: 12/22/17 11:44 AM  Result Value Ref Range   D-Dimer, Quant 3.74 (H) 0.00 - 0.50 ug/mL-FEU    Comment: (NOTE) At the manufacturer cut-off of 0.50 ug/mL FEU, this assay has been documented to exclude PE with a sensitivity and negative predictive value of 97 to 99%.  At this time, this assay has not been approved by the FDA to exclude DVT/VTE. Results should be correlated with clinical presentation. Performed at [facility], [address], [place], [place] [address]   Magnesium     Status: Abnormal   Collection Time: 12/22/17 11:44 AM  Result Value Ref Range   Magnesium 1.4 (L) 1.7 - 2.4 mg/dL    Comment: Performed at [facility], [address], [place], [place] [address]  Acetaminophen level     Status: Abnormal   Collection Time: 12/22/17 11:45 AM  Result Value Ref Range   Acetaminophen (Tylenol), Serum <10 (L) 10 - 30 ug/mL    Comment: (NOTE) Therapeutic concentrations vary significantly. A range of 10-30 ug/mL  may be an effective concentration for many patients. However, some  are best treated at  concentrations outside of this range. Acetaminophen concentrations >150 ug/mL at 4 hours after ingestion  and >50 ug/mL at 12 hours after ingestion are often associated with  toxic reactions. Performed at [facility], [address], [place], [place] [address]   Ethanol     Status: None   Collection Time: 12/22/17 11:45 AM  Result Value Ref Range   Alcohol, Ethyl (B) <10 <10 mg/dL    Comment: (NOTE) Lowest detectable limit for serum alcohol is 10 mg/dL. For medical purposes only. Performed at [facility], [address] [place], [place] [address]   Salicylate level     Status: None   Collection Time: 12/22/17 11:45 AM  Result Value Ref Range   Salicylate Lvl <7.0 2.8 - 30.0 mg/dL    Comment: Performed at [facility], [address], [place], [place] [address]  Lactic acid, plasma     Status: None   Collection Time: 12/22/17 11:46 AM  Result Value Ref Range   Lactic Acid, Venous 1.9 0.5 - 1.9 mmol/L    Comment: Performed at [facility], [address], [place], [place] [address]  Urine rapid drug screen (hosp performed)     Status: None   Collection Time: 12/22/17  2:07 PM  Result Value Ref Range   Opiates NONE DETECTED NONE DETECTED   Cocaine NONE DETECTED NONE DETECTED   Benzodiazepines NONE DETECTED NONE DETECTED   Amphetamines NONE DETECTED NONE DETECTED   Tetrahydrocannabinol NONE DETECTED NONE DETECTED   Barbiturates NONE DETECTED NONE DETECTED    Comment: (NOTE) DRUG SCREEN FOR MEDICAL PURPOSES ONLY.  IF CONFIRMATION IS NEEDED FOR ANY PURPOSE, NOTIFY LAB WITHIN 5 DAYS. LOWEST DETECTABLE LIMITS FOR URINE DRUG SCREEN Drug Class                     Cutoff (ng/mL) Amphetamine and metabolites    1000 Barbiturate and metabolites    200 Benzodiazepine                 200 Tricyclics and metabolites     300 Opiates and metabolites        300 Cocaine and metabolites        300 THC                            50 Performed at [facility], [address], [place], [place] [address]   Urinalysis, Routine w reflex microscopic     Status: Abnormal   Collection Time: 12/22/17  2:08 PM  Result Value Ref Range   Color, Urine YELLOW YELLOW   APPearance HAZY (A) CLEAR   Specific Gravity, Urine 1.006 1.005 - 1.030   pH 6.0 5.0 - 8.0   Glucose, UA NEGATIVE NEGATIVE mg/dL   Hgb urine dipstick NEGATIVE NEGATIVE   Bilirubin Urine NEGATIVE NEGATIVE   Ketones, ur NEGATIVE NEGATIVE mg/dL   Protein, ur NEGATIVE NEGATIVE mg/dL   Nitrite NEGATIVE NEGATIVE   Leukocytes, UA MODERATE (A) NEGATIVE   RBC / HPF 0-5 0 - 5 RBC/hpf   WBC, UA 11-20 0 - 5 WBC/hpf   Bacteria, UA FEW (A) NONE SEEN   Squamous Epithelial / LPF 6-10 0 - 5    Comment: Performed at [facility], [address], [place], [place] [address]  CBG monitoring, ED     Status: Abnormal   Collection Time: 12/22/17  5:28 PM  Result Value Ref Range   Glucose-Capillary 111 (H) 70 - 99 mg/dL  Lactic acid, plasma     Status: None   Collection Time: 12/22/17  6:27 PM  Result Value Ref Range   Lactic Acid, Venous 1.8 0.5 - 1.9 mmol/L    Comment: Performed at [facility], [address] [place], [place] [address]  HIV antibody (Routine Testing)     Status: None   Collection Time: 12/22/17  6:28 PM  Result Value Ref Range   HIV Screen 4th Generation wRfx Non Reactive  Non Reactive    Comment: (NOTE) Performed At: [facility] [address] [place], [place] [id number] Jolene Schimke MD WU:[id number]   Glucose, capillary     Status: None   Collection Time: 12/22/17  9:45 PM  Result Value Ref Range   Glucose-Capillary 91 70 - 99 mg/dL  Basic metabolic panel     Status: Abnormal   Collection Time: 12/23/17  5:53 AM  Result Value Ref Range   Sodium 132 (L) 135 - 145 mmol/L   Potassium 4.1 3.5 - 5.1 mmol/L   Chloride 97 (L) 98 - 111 mmol/L   CO2 24 22 - 32 mmol/L   Glucose, Bld 93 70 - 99 mg/dL   BUN 5 (L) 6 - 20 mg/dL   Creatinine, Ser 9.56  0.44 - 1.00 mg/dL   Calcium 9.2 8.9 - 21.3 mg/dL   GFR calc non Af Amer >60 >60 mL/min   GFR calc Af Amer >60 >60 mL/min   Anion gap 11 5 - 15    Comment: Performed at [facility], [address] [place], [place] [address]  CBC     Status: Abnormal   Collection Time: 12/23/17  5:53 AM  Result Value Ref Range   WBC 6.1 4.0 - 10.5 K/uL   RBC 4.72 3.87 - 5.11 MIL/uL   Hemoglobin 12.1 12.0 - 15.0 g/dL   HCT 84.6 96.2 - 95.2 %   MCV 80.5 80.0 - 100.0 fL   MCH 25.6 (L) 26.0 - 34.0 pg   MCHC 31.8 30.0 - 36.0 g/dL   RDW 84.1 (H) 32.4 - 40.1 %   Platelets 496 (H) 150 - 400 K/uL   nRBC 0.0 0.0 - 0.2 %    Comment: Performed at [facility], [address], [place], [place] [address]  Glucose, capillary     Status: None   Collection Time: 12/23/17  6:32 AM  Result Value Ref Range   Glucose-Capillary 94 70 - 99 mg/dL     Lipid Panel     Component Value Date/Time   CHOL 213 (H) 11/11/2016 1534   TRIG 155 (H) 11/11/2016 1534   HDL 47 11/11/2016 1534   CHOLHDL 4.5 (H) 11/11/2016 1534   CHOLHDL 5.9 06/16/2014 1528   VLDL 37 06/16/2014 1528   LDLCALC 135 (H) 11/11/2016 1534     Lab Results  Component Value Date   HGBA1C 6.8 (A) 07/09/2017   HGBA1C 6.7 11/11/2016   HGBA1C 6.4 05/21/2016     Lab Results  Component Value Date   MICROALBUR 1.36 04/14/2013   LDLCALC 135 (H) 11/11/2016   CREATININE 0.83 12/23/2017     HPI   52 y.o. female with medical history significant for DM2, neuropathy, HTN, HLD, depression, who presented to the ED today with c/o lightheadedness.  She said that she was walking around in [place] this morning and had the acute onset of lightheadedness where she had to sit down or else felt like she was going to faint.  She called a cab to take her to the ED and states that she took her morning dose of Coreg while in route.  She denies any chest pain, palpitations.  She has had no headache, double vision.  No nausea, vomiting, diarrhea.  She has not  had any shortness of breath or cough.  No fever or chills.  She has not had any sick contacts.  The patient's husband passed away 3 months ago at [facility] and patient states that  she has "stopped taking care of herself since then." (She also lost custody of her 2 teenage daughters for unclear reasons but expects they will come back to live with her in January after her court date).  She states that she has been profoundly lonely, depressed, isolating since her husband died. Has no family or friends around. Says she eats a small breakfast and coffee in the morning and then sometimes does not eat anything else for the rest of the day. She does say that she has been drinking most days in the past 3 months but is unable to quantify how much alcohol. She drank a six pack of beer over the weekend.  She does state that she has been compliant with her medications. She says that 3 months ago she was told that she had "broken heart syndrome" when she was admitted to the hospital shortly after her husband died.  It appears that she had an ED encounter in August for shortness of breath, did not have an echo, EKG was normal.  Was felt to be 2/2 anxiety.  ED Course: Upon arrival to the ED her blood pressure was 77/51.  Heart rate was 78, satting well on RA. Afebrile. Diaphoretic. BG 150s. Mg 1.4, D-dimer elevated, CTA neg for PE, Dopplers neg for DVT bilaterally. EKG OK. Creat 1.13. Na 125. Cl 89. CXR clear.  HOSPITAL COURSE:   Hypotension, Near Syncope likely secondary to dehydration: likely 2/2 orthostatic hypotension from volume depletion from inadequate oral intake. No focal findings on exam, no s/s cardiac ischemia. No s/s sepsis. - patient hydrated with IV fluids overnight  and sodium improved from 125-132 -patient likely dehydrated from poor oral intake , likely secondary to her underlying depression and alcohol use  AKI/hypovolemic hyponatremia  likely 2/2 volume depletion / prerenal with concomitant use of  NSAIDs. Also with some urinary urgency, could have UTI.  . -patient hydrated with IV fluids, creatinine improved to 0.83 -patient has been instructed to hold lisinopril until next week -have instructed pt not to take aleve -hold ACE  Probable UTI Started on treatment with ciprofloxacin that was changed to Omnicef 3 days  Alcohol dependence -no s/s withdrawal currently -patient did not score on CIWA  Hypomagnesemia -repleted with 2g MgSO4  DM2 -resumed  metformin -cont neurontin for neuropathy  HLD -cont lipitor  Depression -exacerbated by husband's death. Worsening. Needs grief counseling.  Social work consult. Also pastoral care. Pt is amenable to starting an antidepressant plan we'll defer this to PCP. Patient is followed by [facility] teaching service and she will see them today and set a follow-up appointment     Discharge Exam:   Blood pressure (!) 127/91, pulse 62, temperature 98.4 F (36.9 C), temperature source Oral, resp. rate 16, height 5\' 3"  (1.6 m), weight 74.8 kg, SpO2 97 %.   Cardiovascular:  nL S1, S2, normal rate, reg rhythm, no murmur.  Respiratory:   CTA bilaterally with no wheezes/rales/rhonchi.  Normal respiratory effort.  Abdomen:  soft, NT, ND, NABS  Back:   grossly normal alignment  Skin:  no rash or lesions seen on limited exam  Musculoskeletal:  grossly normal tone BUE/BLE, good ROM, no bony abnormality or obvious joint deformity  Lower extremities:  No LE edema.  Limited foot exam with no ulcerations.  2+ distal pulses.    Follow-up Information    [place], [place] D, DO. Call.   Specialty:  Internal Medicine Why:  Hospital follow-up in 3-5 days Contact information: [address] [place] [address] [phone]           Signed: Richarda Overlie 12/23/2017, 10:27 AM      Time needed to  prepare  discharge, discussed with the patient and family 35 minutes

## 2017-12-23 NOTE — Clinical Social Work Note (Signed)
Clinical Social Work Assessment  Patient Details  Name: [name] MRN: [id number] Date of Birth: [birth year]  Date of referral:  12/23/17               Reason for consult:  Facility Placement, Discharge Planning                Permission sought to share information with:  Family Supports, Oceanographer granted to share information::  No(pt declined having anyone at home/family CSW can talk to)   Housing/Transportation Living arrangements for the past 2 months:  Single Family Home Source of Information:  Patient Patient Interpreter Needed:  None Criminal Activity/Legal Involvement Pertinent to Current Situation/Hospitalization:  No - Comment as needed Significant Relationships:  Other Family Members, Dependent Children Lives with:  Self Do you feel safe going back to the place where you live?  Yes Need for family participation in patient care:  Yes (Comment)  Care giving concerns:  Pt lives at home alone, states her husband died 3 months ago and she is still grieving. Does not have supports locally to help her with processing her emotions and support her.    Social Worker assessment / plan:  CSW spoke with pt at bedside, pt alert and sitting up eating her lunch. Introduced self, role, and reason for visit. Pt originally from [place] at home alone- her husband recently passed away 3 months ago and she began to tear up when speaking to how much she misses his daily presence. She describes how he knew her as a friend for a long time before they were in a romantic relationship and he "made sure I didn't want for nothing." Pt describes how when her husband was sick that she would take care of him and attempted to make sure he was compliant with medications and hospital visits. Now that he has passed pt states she has no supports at home. She states that she usually gets up and goes for a walk, she will occasionally go out to stores, but usually sits in her  home or "looking out the window." Pt has two daughters involved with DSS that she does not currently have custody of (declines to elaborate about reasons around lack of custody of children) , but states that she has gotten them christmas presents and will be sending it to them. Pt has previously been involved with a counselor at [facility] and says that she may be interested in starting that out again. CSW provided pt with grief counseling resources at [facility], pt states she knows where [facility] is on [address] and is very interested in pursuing support groups and resources at [facility] especially as the holidays approach. We finished the visit with talking about grief and coping with the emotions that come along with losing a loved one- CSW reassured pt that there is no timeline for grief and for pt to find safe and supportive spaces to talk about pt's husband.   Employment status:  Retired Health and safety inspector:  Managed Care PT Recommendations:  Not assessed at this time Information / Referral to community resources:  Support Groups, [facility] [employer]  Patient/Family's Response to care:  Pt amenable to speaking with CSW, interested in grief counseling resources through [employer]. She states that she will follow up at discharge with [facility] and [facility] [place].  Patient/Family's Understanding of and Emotional Response to Diagnosis, Current Treatment, and Prognosis:  Pt states understanding of diagnosis, current  treatment and prognosis. Pt aware of supports to process her grief, pt emotionally appropriate with CSW, albeit sad and grieving loss of husband. Pt appears proactive to finding resources for support.   Emotional Assessment Appearance:  Appears stated age Attitude/Demeanor/Rapport:  Engaged, Gracious Affect (typically observed):  Accepting, Appropriate, Adaptable, Pleasant, Grieving Orientation:  Oriented to Self, Oriented to Place, Oriented to  Time,  Oriented to Situation Alcohol / Substance use:  Tobacco Use, Alcohol Use Psych involvement (Current and /or in the community):  Outpatient Provider, Yes (Comment)(states she used to see an outpatient provider)  Discharge Needs  Concerns to be addressed:  Mental Health Concerns, Grief and Loss Concerns Readmission within the last 30 days:  No Current discharge risk:  Lives alone, Lack of support system Barriers to Discharge:  No Barriers Identified   Doy Hutchingsabel H Shardea Cwynar, LCSWA 12/23/2017, 12:49 PM

## 2017-12-24 LAB — URINE CULTURE: Culture: >=100000 — AB

## 2017-12-25 ENCOUNTER — Ambulatory Visit (INDEPENDENT_AMBULATORY_CARE_PROVIDER_SITE_OTHER): Admitting: Internal Medicine

## 2017-12-25 ENCOUNTER — Other Ambulatory Visit: Payer: Self-pay

## 2017-12-25 ENCOUNTER — Encounter: Payer: Self-pay | Admitting: Internal Medicine

## 2017-12-25 VITALS — BP 134/93 | HR 72 | Temp 97.9°F | Ht 62.5 in | Wt 166.7 lb

## 2017-12-25 DIAGNOSIS — R809 Proteinuria, unspecified: Secondary | ICD-10-CM

## 2017-12-25 DIAGNOSIS — I10 Essential (primary) hypertension: Secondary | ICD-10-CM | POA: Diagnosis not present

## 2017-12-25 DIAGNOSIS — E871 Hypo-osmolality and hyponatremia: Secondary | ICD-10-CM | POA: Diagnosis not present

## 2017-12-25 DIAGNOSIS — E1129 Type 2 diabetes mellitus with other diabetic kidney complication: Secondary | ICD-10-CM | POA: Diagnosis not present

## 2017-12-25 DIAGNOSIS — F4321 Adjustment disorder with depressed mood: Secondary | ICD-10-CM | POA: Insufficient documentation

## 2017-12-25 LAB — GLUCOSE, CAPILLARY: Glucose-Capillary: 79 mg/dL (ref 70–99)

## 2017-12-25 LAB — POCT GLYCOSYLATED HEMOGLOBIN (HGB A1C): Hemoglobin A1C: 6.1 % — AB (ref 4.0–5.6)

## 2017-12-25 MED ORDER — GABAPENTIN 300 MG PO CAPS: 300.0000 mg | ORAL_CAPSULE | Freq: Every day | ORAL | 1 refills | Status: DC

## 2017-12-25 MED ORDER — LISINOPRIL 40 MG PO TABS: 40.0000 mg | ORAL_TABLET | Freq: Every day | ORAL | 2 refills | Status: DC

## 2017-12-25 NOTE — Assessment & Plan Note (Signed)
Patient was hyponatremic during her most recent hospital admission. She was admitted with hypotension 2/2 most likely to dehydration in the setting of poor oral intake. Na was 127 at discharge. We will check her bmp today.  -f/u bmp

## 2017-12-25 NOTE — Patient Instructions (Signed)
[name],  It was a pleasure meeting you today! I am so glad to hear you are doing better and making progress. I am also glad you are planning to see a grief counselor this afternoon. I think that will be really helpful!  I sent both your prescriptions for gabapentin and lisinopril to the pharmacy. If your bloodwork looks okay I will give you a call so you can restart your lisinopril.  We can plan to follow up in 3 months or sooner if needed! Happy holidays!

## 2017-12-25 NOTE — Assessment & Plan Note (Addendum)
Patient reported that she is not doing well since her husband passed away a few months ago due to natural causes.  She stopped taking all her medications, leaving the house, answering phone calls and even started eating poorly.  Recently hospitalized due to hypotension secondary most likely to dehydration because of her poor oral intake.  She said this hospitalization really scared her and reminded her of the time her husband told her to take care of herself.  She started taking all her medications and cut out all sugar and is eating better now.  She scheduled an appointment this afternoon to see a grief counselor and plans to start volunteering with a homeless shelter.  She also plans to restart a hobby of arts and crafts.  She starting to get out of the house every day and into her regular routine. She denied any suicidal ideations at this time or thoughts to hurt anyone else.  PHQ-9 was 11.  At this time I do not feel that the patient necessitates antidepressant therapy.  She seems to be going through the normal stages of grief and making significant strides.  We will closely monitor her for signs and symptoms of depression. She is also informed that there is a Veterinary surgeoncounselor in our clinic if she needs more resources.

## 2017-12-25 NOTE — Assessment & Plan Note (Signed)
BP 134/93 today. Patient was admitted due to hypotension 2/2 most likely due to dehydration in the setting of poor oral intake. She was told to hold her lisinopril until 12/9. We will recheck her bmp today and if her kidney function looks okay she will be okay to resume her lisinopril   -f/u BMP

## 2017-12-25 NOTE — Assessment & Plan Note (Signed)
Patient reported she was doing poorly with her diet and not taking her medications ever since her husband passed a few months ago. She said her recent hospital admission scared her and she is now back on track with taking her medications and eating well. She cut out all sugar. We will check her Hgb A1c today.   -f/u Hgb A1c

## 2017-12-25 NOTE — Progress Notes (Signed)
   CC: hypotension, diabetes  HPI:  [name] is a 52 y.o. with a PMHx listed below presenting for a hospital follow up regarding her hypotension 2/2 to dehydration and her diabetes.   For details of today's visit and the status of his chronic medical issues please refer to the assessment and plan.   Past Medical History:  Diagnosis Date  . Anemia    when in late teens  . Anxiety   . Breast abscess   . Chlamydia   . Depression    PTSD also  . Diabetes mellitus without complication (HCC)   . Foot pain, bilateral    "burning"  . Gonorrhea   . High cholesterol   . Hypercholesteremia   . Hypertension   . Recurrent upper respiratory infection (URI)    URI/SOB-treated with antibiotics-cleared up now  . Seizures (HCC)   . Substance abuse (HCC)    alcohol  . Tuberculosis    was treated   Review of Systems:   Review of Systems  Genitourinary: Negative for dysuria, frequency and urgency.  Neurological: Negative for dizziness, loss of consciousness, weakness and headaches.  Psychiatric/Behavioral: Negative for suicidal ideas.    Physical Exam:  Vitals:   12/25/17 0930  BP: (!) 134/93  Pulse: 72  Temp: 97.9 F (36.6 C)  SpO2: 99%  Weight: 166 lb 11.2 oz (75.6 kg)  Height: 5' 2.5" (1.588 m)   Physical Exam  Constitutional: She is oriented to person, place, and time and well-developed, well-nourished, and in no distress.  Cardiovascular: Normal rate, regular rhythm and normal heart sounds.  No murmur heard. Pulmonary/Chest: Effort normal and breath sounds normal. No respiratory distress. She has no wheezes.  Abdominal: Soft. Bowel sounds are normal. She exhibits no distension. There is no tenderness.  Musculoskeletal: She exhibits no edema.  Neurological: She is alert and oriented to person, place, and time.  Skin: Skin is warm and dry.  Psychiatric: Mood, memory, affect and judgment normal.    Assessment & Plan:   See Encounters Tab for problem  based charting.  Patient seen with Dr. Heide SparkNarendra

## 2017-12-26 LAB — BMP8+ANION GAP
Anion Gap: 18 mmol/L (ref 10.0–18.0)
BUN/Creatinine Ratio: 15 (ref 9–23)
BUN: 10 mg/dL (ref 6–24)
CO2: 20 mmol/L (ref 20–29)
Calcium: 10.2 mg/dL (ref 8.7–10.2)
Chloride: 94 mmol/L — ABNORMAL LOW (ref 96–106)
Creatinine, Ser: 0.67 mg/dL (ref 0.57–1.00)
GFR calc Af Amer: 118 mL/min/{1.73_m2} (ref >59)
GFR, EST NON AFRICAN AMERICAN: 102 mL/min/{1.73_m2} (ref >59)
Glucose: 76 mg/dL (ref 65–99)
Potassium: 5.1 mmol/L (ref 3.5–5.2)
Sodium: 132 mmol/L — ABNORMAL LOW (ref 134–144)

## 2018-01-07 ENCOUNTER — Other Ambulatory Visit: Payer: Self-pay | Admitting: Internal Medicine

## 2018-01-07 NOTE — Telephone Encounter (Signed)
Gabapentin #90 with 1 refill sent to [facility] on 12/25/2017. Call placed to Pharmacy. States they have this Rx but it's too soon to fill. Earliest fill date is 01/12/2018. Returned call to patient. No answer. Left this information on patient's VM.  Kinnie FeilL. Aleiah Mohammed, RN, BSN

## 2018-01-07 NOTE — Telephone Encounter (Signed)
Refill Request   gabapentin (NEURONTIN) 300 MG capsule  [facility] - [place], [place] - [address]

## 2018-01-26 ENCOUNTER — Telehealth: Payer: Self-pay | Admitting: *Deleted

## 2018-01-26 NOTE — Telephone Encounter (Signed)
Pt called to be sure her BP wasn't too low. This am her BP was 98/70 and pulse 72 and blood sugar 101. Stated she takes it every other day; last week it was 108/76 and 107/73. Denies dizziness/lightheadedness. Taking Lisinopril. Telephone# [phone] (cell).

## 2018-01-26 NOTE — Telephone Encounter (Signed)
I would recommend that the patient hold her lisinopril and come in to the clinic this week to reassess her BP and lisinopril dose. We may need to decrease her dose or change her medication. Please ask the patient to bring in home BP machine so we can also check its accuracy.  Thanks

## 2018-01-26 NOTE — Telephone Encounter (Addendum)
Called pt - no answer; home # is not in service and cell-no answer, mailbox has not been set up and unable to leave message.  Pt called back - informed of Dr Patty Sermons response for pt to hold Lisinopril and come in for f/u appt to reassess BP and Lisinopril's dose. And to bring her machine to check for accuracy. Voiced understanding. Appt scheduled Wed 1/8 in [facility].

## 2018-01-27 ENCOUNTER — Telehealth: Payer: Self-pay

## 2018-01-27 NOTE — Telephone Encounter (Signed)
Pt would like to speak with a nurse about bp.

## 2018-01-27 NOTE — Telephone Encounter (Signed)
Pt called for exactly the same as 1/6, read her dr Patty Sermons instructions again, reassured, went over appt time. Pt verbalized back instructions

## 2018-01-28 ENCOUNTER — Encounter: Payer: Self-pay | Admitting: Internal Medicine

## 2018-01-28 ENCOUNTER — Ambulatory Visit: Payer: Self-pay

## 2018-01-28 NOTE — Progress Notes (Signed)
Internal Medicine Clinic Attending  I saw and evaluated the patient.  I personally confirmed the key portions of the history and exam documented by Dr.  Rehman  and I reviewed pertinent patient test results.  The assessment, diagnosis, and plan were formulated together and I agree with the documentation in the resident's note.  

## 2018-02-03 ENCOUNTER — Encounter: Payer: Self-pay | Admitting: Internal Medicine

## 2018-03-23 ENCOUNTER — Encounter: Payer: Self-pay | Admitting: Internal Medicine

## 2018-03-27 ENCOUNTER — Telehealth: Payer: Self-pay | Admitting: Internal Medicine

## 2018-03-27 NOTE — Telephone Encounter (Signed)
Needs to speak a nurse regarding medicine; pt contact [phone]

## 2018-03-27 NOTE — Telephone Encounter (Signed)
Thank you, Myriam Jacobson. I agree she needs to be seen. I appreciate you working her in so quickly.

## 2018-03-27 NOTE — Telephone Encounter (Signed)
Pt calls and states that she thinks she made a mistake by asking to stop lisinopril, that since stopping her head has been "buzzing". She now thinks her BP meter was the problem. She is advised she really needs appt and could we schedule one that she would be able to come to since she had recently missed a few, scheduled for 3/9 at 1545 w/ dr bloomfield. She was then cautioned if her "buzzing" got worse or she has chest pain, short of breath, severe h/a, N&V, weakness of arm or leg, change in speech, vision or walking/ grasping to call 911. She is agreeable

## 2018-03-30 ENCOUNTER — Encounter: Payer: Self-pay | Admitting: Internal Medicine

## 2018-04-05 ENCOUNTER — Emergency Department (HOSPITAL_COMMUNITY)
Admission: EM | Admit: 2018-04-05 | Discharge: 2018-04-05 | Disposition: A | Discharge status: Home / Self Care | Attending: Emergency Medicine | Admitting: Emergency Medicine

## 2018-04-05 ENCOUNTER — Other Ambulatory Visit: Payer: Self-pay

## 2018-04-05 ENCOUNTER — Encounter (HOSPITAL_COMMUNITY): Payer: Self-pay | Admitting: Emergency Medicine

## 2018-04-05 DIAGNOSIS — F17210 Nicotine dependence, cigarettes, uncomplicated: Secondary | ICD-10-CM | POA: Insufficient documentation

## 2018-04-05 DIAGNOSIS — Z7984 Long term (current) use of oral hypoglycemic drugs: Secondary | ICD-10-CM | POA: Insufficient documentation

## 2018-04-05 DIAGNOSIS — E119 Type 2 diabetes mellitus without complications: Secondary | ICD-10-CM | POA: Diagnosis not present

## 2018-04-05 DIAGNOSIS — J029 Acute pharyngitis, unspecified: Secondary | ICD-10-CM

## 2018-04-05 DIAGNOSIS — I10 Essential (primary) hypertension: Secondary | ICD-10-CM | POA: Diagnosis not present

## 2018-04-05 DIAGNOSIS — Z79899 Other long term (current) drug therapy: Secondary | ICD-10-CM | POA: Insufficient documentation

## 2018-04-05 DIAGNOSIS — R070 Pain in throat: Secondary | ICD-10-CM | POA: Diagnosis present

## 2018-04-05 LAB — GROUP A STREP BY PCR: Group A Strep by PCR: NOT DETECTED

## 2018-04-05 MED ORDER — ACETAMINOPHEN 325 MG PO TABS
650.0000 mg | ORAL_TABLET | Freq: Once | ORAL | Status: AC
  Administered 2018-04-05: 650 mg via ORAL
  Filled 2018-04-05: qty 2

## 2018-04-05 MED ORDER — SALINE SPRAY 0.65 % NA SOLN: 1.0000 | NASAL | 0 refills | Status: DC | PRN

## 2018-04-05 MED ORDER — ACETAMINOPHEN 325 MG PO TABS: 650.0000 mg | ORAL_TABLET | Freq: Four times a day (QID) | ORAL | 0 refills | Status: DC | PRN

## 2018-04-05 MED ORDER — LISINOPRIL 20 MG PO TABS
40.0000 mg | ORAL_TABLET | Freq: Once | ORAL | Status: AC
  Administered 2018-04-05: 40 mg via ORAL
  Filled 2018-04-05: qty 2

## 2018-04-05 MED ORDER — CARVEDILOL 6.25 MG PO TABS
6.2500 mg | ORAL_TABLET | Freq: Once | ORAL | Status: AC
  Administered 2018-04-05: 6.25 mg via ORAL
  Filled 2018-04-05: qty 1

## 2018-04-05 NOTE — ED Notes (Signed)
Bed: WTR7 Expected date:  Expected time:  Means of arrival:  Comments: EMS- sore throat

## 2018-04-05 NOTE — Discharge Instructions (Addendum)
Take Tylenol as prescribed, as needed for your pain.  You can use nasal saline as prescribed for nasal congestion.  Take your blood pressure medications as prescribed.  Please follow-up with your doctor if your symptoms are not improving over the next 5 to 7 days.  Please return the emergency department if you develop any new or worsening symptoms including shortness of breath, persistent fever of 100.4, masses in your neck, inability to swallow your own saliva, lockjaw, or any other new or concerning symptoms.

## 2018-04-05 NOTE — ED Provider Notes (Addendum)
[facility]-EMERGENCY DEPT Provider Note   CSN: [id number] Arrival date & time: 04/05/18  1116    History   Chief Complaint Chief Complaint  Patient presents with  . Sore Throat    HPI [name] is a 53 y.o. female with history of hypertension, diabetes, alcohol abuse who presents with sore throat that began this morning. She reports that she has had a little bit of nasal congestion for the past 2 days. She also reports high blood pressure this morning.  Patient reports she was feeling a little warm while drinking her coffee so she took her blood pressure at home and it was elevated.  She did not take her morning medications.  She called EMS.  She denies any chest pain, shortness of breath, headache, abdominal pain, nausea, vomiting, urinary symptoms, cough, ear pain, fever.  No medications taken prior to arrival.       HPI  Past Medical History:  Diagnosis Date  . Anemia    when in late teens  . Anxiety   . Breast abscess   . Chlamydia   . Depression    PTSD also  . Diabetes mellitus without complication (HCC)   . Foot pain, bilateral    "burning"  . Gonorrhea   . High cholesterol   . Hypercholesteremia   . Hypertension   . Recurrent upper respiratory infection (URI)    URI/SOB-treated with antibiotics-cleared up now  . Seizures (HCC)   . Substance abuse (HCC)    alcohol  . Tuberculosis    was treated    Patient Active Problem List   Diagnosis Date Noted  . Grief 12/25/2017  . Near syncope 12/22/2017  . AKI (acute kidney injury) (HCC) 12/22/2017  . Postural dizziness with presyncope 12/22/2017  . Hypotension 12/22/2017  . Healthcare maintenance 11/12/2016  . Occipital neuralgia of left side 06/21/2016  . Dysuria 05/21/2016  . Avascular necrosis (HCC) 03/01/2016  . Hyponatremia 08/29/2015  . Tobacco use disorder 04/12/2015  . Abnormal brain MRI 03/10/2015  . Elevated liver enzymes 04/14/2013  . Diabetic neuropathy, painful  (HCC) 04/14/2013  . Alcohol dependence (HCC) 03/23/2013  . Substance induced mood disorder (HCC) 03/22/2013  . Depression 02/05/2012  . Hyperlipidemia 02/05/2012  . Hypertension 01/07/2012  . Type 2 diabetes mellitus with microalbuminuria, without long-term current use of insulin (HCC) 01/07/2012    Past Surgical History:  Procedure Laterality Date  . BREAST BIOPSY  11/13/2010  . BREAST LUMPECTOMY    . INCISE AND DRAIN ABCESS  01/03/11   breast abscess  . TONGUE SURGERY  2008   lump removed     OB History    Gravida  2   Para  2   Term  2   Preterm      AB      Living  2     SAB      TAB      Ectopic      Multiple      Live Births               Home Medications    Prior to Admission medications   Medication Sig Start Date End Date Taking? Authorizing Provider  acetaminophen (TYLENOL) 325 MG tablet Take 2 tablets (650 mg total) by mouth every 6 (six) hours as needed for moderate pain. 04/05/18   Azir Muzyka, Waylan Boga, PA-C  atorvastatin (LIPITOR) 40 MG tablet Take 1 tablet (40 mg total) by mouth daily. IM Program. 07/09/17 12/22/17  Molt, Bethany, DO  carvedilol (COREG) 6.25 MG tablet Take 1 tablet (6.25 mg total) by mouth 2 (two) times daily. Patient taking differently: Take 6.25 mg by mouth daily.  07/09/17 01/05/18  Molt, Toma Copier, DO  cetirizine (ZYRTEC ALLERGY) 10 MG tablet Take 1 tablet (10 mg total) by mouth daily. Patient not taking: Reported on 09/17/2017 05/31/17   Emi Holes, PA-C  gabapentin (NEURONTIN) 300 MG capsule Take 1 capsule (300 mg total) by mouth at bedtime. 12/25/17   Rehman, Areeg N, DO  guaiFENesin (MUCINEX) 600 MG 12 hr tablet Take 600 mg by mouth 2 (two) times daily.    [provider]  hydrOXYzine (ATARAX/VISTARIL) 25 MG tablet TAKE 1 TABLET BY MOUTH ONCE DAILY AS NEEDED FOR ITCHING (BLADDER  PAIN) Patient not taking: No sig reported 09/03/17   Molt, Bethany, DO  lisinopril (PRINIVIL,ZESTRIL) 40 MG tablet Take 1 tablet (40 mg  total) by mouth daily. 12/29/17   Rehman, Areeg N, DO  metFORMIN (GLUCOPHAGE) 1000 MG tablet Take 1 tablet (1,000 mg total) by mouth 2 (two) times daily with a meal. 04/28/17   Molt, Bethany, DO  Phenazopyridine HCl (AZO TABS PO) Take 2 tablets by mouth daily as needed (bladder pain).    [provider]  sodium chloride (OCEAN) 0.65 % SOLN nasal spray Place 1 spray into both nostrils as needed for congestion. 04/05/18   Emi Holes, PA-C    Family History Family History  Problem Relation Age of Onset  . Breast cancer Mother   . Breast cancer Sister   . Breast cancer Maternal Aunt     Social History Social History   Tobacco Use  . Smoking status: Current Every Day Smoker    Packs/day: 1.00    Types: Cigarettes    Last attempt to quit: 12/11/2012    Years since quitting: 5.3  . Smokeless tobacco: Never Used  . Tobacco comment: 1 pack ever 3 days  Substance Use Topics  . Alcohol use: Yes    Comment: Beer on weekends  . Drug use: No     Allergies   Patient has no known allergies.   Review of Systems Review of Systems  Constitutional: Negative for chills and fever.  HENT: Positive for sore throat. Negative for facial swelling.   Respiratory: Negative for shortness of breath.   Cardiovascular: Negative for chest pain.  Gastrointestinal: Negative for abdominal pain, nausea and vomiting.  Genitourinary: Negative for dysuria.  Musculoskeletal: Negative for back pain.  Skin: Negative for rash and wound.  Neurological: Negative for headaches.  Psychiatric/Behavioral: The patient is not nervous/anxious.      Physical Exam Updated Vital Signs BP 137/90   Pulse 72   Temp 97.7 F (36.5 C) (Oral)   Resp 20   SpO2 99%   Physical Exam Vitals signs and nursing note reviewed.  Constitutional:      General: She is not in acute distress.    Appearance: She is well-developed. She is not diaphoretic.  HENT:     Head: Normocephalic and atraumatic.     Right Ear:  Tympanic membrane normal.     Left Ear: Tympanic membrane normal.     Nose: Congestion present.     Mouth/Throat:     Pharynx: Posterior oropharyngeal erythema present. No pharyngeal swelling or oropharyngeal exudate.     Tonsils: No tonsillar exudate or tonsillar abscesses. Swelling: 1+ on the right. 1+ on the left.  Eyes:     General: No scleral icterus.  Right eye: No discharge.        Left eye: No discharge.     Conjunctiva/sclera: Conjunctivae normal.     Pupils: Pupils are equal, round, and reactive to light.  Neck:     Musculoskeletal: Normal range of motion and neck supple.     Thyroid: No thyromegaly.  Cardiovascular:     Rate and Rhythm: Normal rate and regular rhythm.     Heart sounds: Normal heart sounds. No murmur. No friction rub. No gallop.   Pulmonary:     Effort: Pulmonary effort is normal. No respiratory distress.     Breath sounds: Normal breath sounds. No stridor. No wheezing or rales.  Abdominal:     General: Bowel sounds are normal. There is no distension.     Palpations: Abdomen is soft.     Tenderness: There is no abdominal tenderness. There is no guarding or rebound.  Lymphadenopathy:     Cervical: No cervical adenopathy.  Skin:    General: Skin is warm and dry.     Coloration: Skin is not pale.     Findings: No rash.  Neurological:     Mental Status: She is alert.     Coordination: Coordination normal.      ED Treatments / Results  Labs (all labs ordered are listed, but only abnormal results are displayed) Labs Reviewed  GROUP A STREP BY PCR    EKG None  Radiology No results found.  Procedures Procedures (including critical care time)  Medications Ordered in ED Medications  acetaminophen (TYLENOL) tablet 650 mg (has no administration in time range)  lisinopril (PRINIVIL,ZESTRIL) tablet 40 mg (40 mg Oral Given 04/05/18 1228)  carvedilol (COREG) tablet 6.25 mg (6.25 mg Oral Given 04/05/18 1228)     Initial Impression /  Assessment and Plan / ED Course  I have reviewed the triage vital signs and the nursing notes.  Pertinent labs & imaging results that were available during my care of the patient were reviewed by me and considered in my medical decision making (see chart for details).        Patient presenting with probably the start of a viral URI and is very well-appearing.  She has had a couple days of nasal congestion and now sore throat.  Strep PCR is negative.  Patient also reports elevated blood pressure this morning, however she did not take her morning medications.  After given her morning medications in the ED, her blood pressure decreased to 137/90.  She has no signs of hypertensive urgency or emergency.  Patient's given Tylenol in the ED.  Recommended Tylenol and nasal saline at home.  Rest and fluids discussed.  Return precautions discussed.  Patient understands and agrees with plan.  Patient vitals stable throughout ED course and discharged in satisfactory condition.  Final Clinical Impressions(s) / ED Diagnoses   Final diagnoses:  Viral pharyngitis  Essential hypertension    ED Discharge Orders         Ordered    acetaminophen (TYLENOL) 325 MG tablet  Every 6 hours PRN     04/05/18 1244    sodium chloride (OCEAN) 0.65 % SOLN nasal spray  As needed     04/05/18 1244             Emi Holes, PA-C 04/05/18 1253    Terrilee Files, MD 04/06/18 [phone]

## 2018-04-05 NOTE — ED Triage Notes (Signed)
The patient is from home with complaints of sore throat and high blood pressure starting around 0830 this morning. BP was originally 230/112. She denies cough, fever and GI upset.   EMS vitals and CBG: 130/78 BP 70 HR 20 Resp Rate 98% O2 sats on room air 90 CBG

## 2018-04-06 ENCOUNTER — Encounter: Payer: Self-pay | Admitting: Internal Medicine

## 2018-04-06 ENCOUNTER — Other Ambulatory Visit: Payer: Self-pay

## 2018-04-06 ENCOUNTER — Emergency Department (HOSPITAL_COMMUNITY)
Admission: EM | Admit: 2018-04-06 | Discharge: 2018-04-06 | Disposition: A | Discharge status: Home / Self Care | Attending: Emergency Medicine | Admitting: Emergency Medicine

## 2018-04-06 ENCOUNTER — Encounter (HOSPITAL_COMMUNITY): Payer: Self-pay | Admitting: Emergency Medicine

## 2018-04-06 DIAGNOSIS — Z79899 Other long term (current) drug therapy: Secondary | ICD-10-CM | POA: Diagnosis not present

## 2018-04-06 DIAGNOSIS — I10 Essential (primary) hypertension: Secondary | ICD-10-CM | POA: Diagnosis present

## 2018-04-06 DIAGNOSIS — F17210 Nicotine dependence, cigarettes, uncomplicated: Secondary | ICD-10-CM | POA: Diagnosis not present

## 2018-04-06 DIAGNOSIS — I158 Other secondary hypertension: Secondary | ICD-10-CM

## 2018-04-06 DIAGNOSIS — E1140 Type 2 diabetes mellitus with diabetic neuropathy, unspecified: Secondary | ICD-10-CM | POA: Insufficient documentation

## 2018-04-06 NOTE — ED Triage Notes (Signed)
Patient states she was here earlier today. Patient states she checked her blood pressure. She states that it is still high.

## 2018-04-06 NOTE — ED Provider Notes (Signed)
[facility]-EMERGENCY DEPT Provider Note  CSN: [id number] Arrival date & time: 04/06/18 0144  Chief Complaint(s) Hypertension  HPI [name] is a 53 y.o. female here for elevated BP at home. Wrist BP machine read 180/110s. She has not missed any doses. No associated chest pain or SOB. No headache. Was seen earlier for URI symptoms. No acute changes. Has been using nasal spray. No other OTC medicine.   HPI  Past Medical History Past Medical History:  Diagnosis Date  . Anemia    when in late teens  . Anxiety   . Breast abscess   . Chlamydia   . Depression    PTSD also  . Diabetes mellitus without complication (HCC)   . Foot pain, bilateral    "burning"  . Gonorrhea   . High cholesterol   . Hypercholesteremia   . Hypertension   . Recurrent upper respiratory infection (URI)    URI/SOB-treated with antibiotics-cleared up now  . Seizures (HCC)   . Substance abuse (HCC)    alcohol  . Tuberculosis    was treated   Patient Active Problem List   Diagnosis Date Noted  . Grief 12/25/2017  . Near syncope 12/22/2017  . AKI (acute kidney injury) (HCC) 12/22/2017  . Postural dizziness with presyncope 12/22/2017  . Hypotension 12/22/2017  . Healthcare maintenance 11/12/2016  . Occipital neuralgia of left side 06/21/2016  . Dysuria 05/21/2016  . Avascular necrosis (HCC) 03/01/2016  . Hyponatremia 08/29/2015  . Tobacco use disorder 04/12/2015  . Abnormal brain MRI 03/10/2015  . Elevated liver enzymes 04/14/2013  . Diabetic neuropathy, painful (HCC) 04/14/2013  . Alcohol dependence (HCC) 03/23/2013  . Substance induced mood disorder (HCC) 03/22/2013  . Depression 02/05/2012  . Hyperlipidemia 02/05/2012  . Hypertension 01/07/2012  . Type 2 diabetes mellitus with microalbuminuria, without long-term current use of insulin (HCC) 01/07/2012   Home Medication(s) Prior to Admission medications   Medication Sig Start Date End Date Taking? Authorizing  Provider  acetaminophen (TYLENOL) 325 MG tablet Take 2 tablets (650 mg total) by mouth every 6 (six) hours as needed for moderate pain. 04/05/18   Law, Waylan Boga, PA-C  atorvastatin (LIPITOR) 40 MG tablet Take 1 tablet (40 mg total) by mouth daily. IM Program. 07/09/17 12/22/17  Molt, Bethany, DO  carvedilol (COREG) 6.25 MG tablet Take 1 tablet (6.25 mg total) by mouth 2 (two) times daily. Patient taking differently: Take 6.25 mg by mouth daily.  07/09/17 01/05/18  Molt, Toma Copier, DO  cetirizine (ZYRTEC ALLERGY) 10 MG tablet Take 1 tablet (10 mg total) by mouth daily. Patient not taking: Reported on 09/17/2017 05/31/17   Emi Holes, PA-C  gabapentin (NEURONTIN) 300 MG capsule Take 1 capsule (300 mg total) by mouth at bedtime. 12/25/17   Rehman, Areeg N, DO  guaiFENesin (MUCINEX) 600 MG 12 hr tablet Take 600 mg by mouth 2 (two) times daily.    [provider]  hydrOXYzine (ATARAX/VISTARIL) 25 MG tablet TAKE 1 TABLET BY MOUTH ONCE DAILY AS NEEDED FOR ITCHING (BLADDER  PAIN) Patient not taking: No sig reported 09/03/17   Molt, Bethany, DO  lisinopril (PRINIVIL,ZESTRIL) 40 MG tablet Take 1 tablet (40 mg total) by mouth daily. 12/29/17   Rehman, Areeg N, DO  metFORMIN (GLUCOPHAGE) 1000 MG tablet Take 1 tablet (1,000 mg total) by mouth 2 (two) times daily with a meal. 04/28/17   Molt, Bethany, DO  Phenazopyridine HCl (AZO TABS PO) Take 2 tablets by mouth daily as needed (bladder pain).  [provider]  sodium chloride (OCEAN) 0.65 % SOLN nasal spray Place 1 spray into both nostrils as needed for congestion. 04/05/18   Emi HolesLaw, Alexandra M, PA-C                                                                                                                                    Past Surgical History Past Surgical History:  Procedure Laterality Date  . BREAST BIOPSY  11/13/2010  . BREAST LUMPECTOMY    . INCISE AND DRAIN ABCESS  01/03/11   breast abscess  . TONGUE SURGERY  2008   lump removed    Family History Family History  Problem Relation Age of Onset  . Breast cancer Mother   . Breast cancer Sister   . Breast cancer Maternal Aunt     Social History Social History   Tobacco Use  . Smoking status: Current Every Day Smoker    Packs/day: 1.00    Types: Cigarettes    Last attempt to quit: 12/11/2012    Years since quitting: 5.3  . Smokeless tobacco: Never Used  . Tobacco comment: 1 pack ever 3 days  Substance Use Topics  . Alcohol use: Yes    Comment: Beer on weekends  . Drug use: No   Allergies Patient has no known allergies.  Review of Systems Review of Systems All other systems are reviewed and are negative for acute change except as noted in the HPI  Physical Exam Vital Signs  I have reviewed the triage vital signs BP (!) 195/96 (BP Location: Left Arm)   Pulse 61   Temp (!) 97.5 F (36.4 C) (Oral)   Resp 18   Ht 5\' 3"  (1.6 m)   Wt 74.8 kg   SpO2 100%   BMI 29.23 kg/m   Physical Exam Vitals signs reviewed.  Constitutional:      General: She is not in acute distress.    Appearance: She is well-developed. She is not diaphoretic.  HENT:     Head: Normocephalic and atraumatic.     Nose: Nose normal.  Eyes:     General: No scleral icterus.       Right eye: No discharge.        Left eye: No discharge.     Conjunctiva/sclera: Conjunctivae normal.     Pupils: Pupils are equal, round, and reactive to light.  Neck:     Musculoskeletal: Normal range of motion and neck supple.  Cardiovascular:     Rate and Rhythm: Normal rate and regular rhythm.     Heart sounds: No murmur. No friction rub. No gallop.   Pulmonary:     Effort: Pulmonary effort is normal. No respiratory distress.     Breath sounds: Normal breath sounds. No stridor. No rales.  Abdominal:     General: There is no distension.     Palpations: Abdomen is soft.     Tenderness: There  is no abdominal tenderness.  Musculoskeletal:        General: No tenderness.  Skin:    General: Skin  is warm and dry.     Findings: No erythema or rash.  Neurological:     Mental Status: She is alert and oriented to person, place, and time.     ED Results and Treatments Labs (all labs ordered are listed, but only abnormal results are displayed) Labs Reviewed - No data to display                                                                                                                       EKG  EKG Interpretation  Date/Time:    Ventricular Rate:    PR Interval:    QRS Duration:   QT Interval:    QTC Calculation:   R Axis:     Text Interpretation:        Radiology No results found. Pertinent labs & imaging results that were available during my care of the patient were reviewed by me and considered in my medical decision making (see chart for details).  Medications Ordered in ED Medications - No data to display                                                                                                                                  Procedures Procedures  (including critical care time)  Medical Decision Making / ED Course I have reviewed the nursing notes for this encounter and the patient's prior records (if available in EHR or on provided paperwork).    Asymptomatic HTN. No chest pain or symptoms concerning for end-organ damage. No labs needed.  The patient appears reasonably screened and/or stabilized for discharge and I doubt any other medical condition or other [facility] requiring further screening, evaluation, or treatment in the ED at this time prior to discharge.  The patient is safe for discharge with strict return precautions.   Final Clinical Impression(s) / ED Diagnoses Final diagnoses:  Other secondary hypertension   Disposition: Discharge  Condition: Good  I have discussed the results, Dx and Tx plan with the patient who expressed understanding and agree(s) with the plan. Discharge instructions discussed at great length. The patient was given  strict return precautions who verbalized understanding of the instructions. No further questions at time of discharge.    ED Discharge Orders    None  Follow Up: Primary care provider  Schedule an appointment as soon as possible for a visit  As needed      This chart was dictated using voice recognition software.  Despite best efforts to proofread,  errors can occur which can change the documentation meaning.   Nira Conn, MD 04/06/18 [phone]

## 2018-04-11 ENCOUNTER — Other Ambulatory Visit: Payer: Self-pay

## 2018-04-11 ENCOUNTER — Emergency Department (HOSPITAL_COMMUNITY)
Admission: EM | Admit: 2018-04-11 | Discharge: 2018-04-11 | Disposition: A | Discharge status: Home / Self Care | Attending: Emergency Medicine | Admitting: Emergency Medicine

## 2018-04-11 ENCOUNTER — Encounter (HOSPITAL_COMMUNITY): Payer: Self-pay

## 2018-04-11 DIAGNOSIS — Z711 Person with feared health complaint in whom no diagnosis is made: Secondary | ICD-10-CM

## 2018-04-11 DIAGNOSIS — F17210 Nicotine dependence, cigarettes, uncomplicated: Secondary | ICD-10-CM | POA: Diagnosis not present

## 2018-04-11 DIAGNOSIS — E1140 Type 2 diabetes mellitus with diabetic neuropathy, unspecified: Secondary | ICD-10-CM | POA: Insufficient documentation

## 2018-04-11 DIAGNOSIS — Z7984 Long term (current) use of oral hypoglycemic drugs: Secondary | ICD-10-CM | POA: Insufficient documentation

## 2018-04-11 DIAGNOSIS — I10 Essential (primary) hypertension: Secondary | ICD-10-CM | POA: Diagnosis present

## 2018-04-11 NOTE — ED Notes (Signed)
Pt declined d/c vital signs, sts she checked her blood pressure too many times today and doctor told her to only do it once a day form now on.

## 2018-04-11 NOTE — ED Provider Notes (Signed)
[facility]-EMERGENCY DEPT Provider Note   CSN: [id number] Arrival date & time: 04/11/18  1426    History   Chief Complaint Chief Complaint  Patient presents with  . Hypertension    HPI Perle Fishman is a 53 y.o. female who presents with concern for elevated blood pressure.  Past medical history significant for hypertension, diabetes, hyperlipidemia.  The patient states that she was worried because her blood pressure was elevated today.  She shows me a copy of her blood pressure readings today which are 132/80 and 139/86 with a pulse of 59 and 62 respectively.  The patient has been seen for high blood pressure recently in the ED and diagnosed with asymptomatic hypertension and advised to follow-up with her doctor which she has not done yet.  She states that she has had some ringing in the ears ("buzzing") this morning which has resolved but otherwise denies dizziness, headache, vision changes, extremity weakness, gait instability, chest pain, shortness of breath.  She states that she is unsure of what her blood pressure is supposed to be.  She also has been checking her blood sugar multiple times a day despite her doctor telling her to check it once a week.     HPI  Past Medical History:  Diagnosis Date  . Anemia    when in late teens  . Anxiety   . Breast abscess   . Chlamydia   . Depression    PTSD also  . Diabetes mellitus without complication (HCC)   . Foot pain, bilateral    "burning"  . Gonorrhea   . High cholesterol   . Hypercholesteremia   . Hypertension   . Recurrent upper respiratory infection (URI)    URI/SOB-treated with antibiotics-cleared up now  . Seizures (HCC)   . Substance abuse (HCC)    alcohol  . Tuberculosis    was treated    Patient Active Problem List   Diagnosis Date Noted  . Grief 12/25/2017  . Near syncope 12/22/2017  . AKI (acute kidney injury) (HCC) 12/22/2017  . Postural dizziness with presyncope 12/22/2017   . Hypotension 12/22/2017  . Healthcare maintenance 11/12/2016  . Occipital neuralgia of left side 06/21/2016  . Dysuria 05/21/2016  . Avascular necrosis (HCC) 03/01/2016  . Hyponatremia 08/29/2015  . Tobacco use disorder 04/12/2015  . Abnormal brain MRI 03/10/2015  . Elevated liver enzymes 04/14/2013  . Diabetic neuropathy, painful (HCC) 04/14/2013  . Alcohol dependence (HCC) 03/23/2013  . Substance induced mood disorder (HCC) 03/22/2013  . Depression 02/05/2012  . Hyperlipidemia 02/05/2012  . Hypertension 01/07/2012  . Type 2 diabetes mellitus with microalbuminuria, without long-term current use of insulin (HCC) 01/07/2012    Past Surgical History:  Procedure Laterality Date  . BREAST BIOPSY  11/13/2010  . BREAST LUMPECTOMY    . INCISE AND DRAIN ABCESS  01/03/11   breast abscess  . TONGUE SURGERY  2008   lump removed     OB History    Gravida  2   Para  2   Term  2   Preterm      AB      Living  2     SAB      TAB      Ectopic      Multiple      Live Births               Home Medications    Prior to Admission medications   Medication Sig Start  Date End Date Taking? Authorizing Provider  acetaminophen (TYLENOL) 325 MG tablet Take 2 tablets (650 mg total) by mouth every 6 (six) hours as needed for moderate pain. 04/05/18   Law, Waylan Boga, PA-C  atorvastatin (LIPITOR) 40 MG tablet Take 1 tablet (40 mg total) by mouth daily. IM Program. 07/09/17 12/22/17  Molt, Bethany, DO  carvedilol (COREG) 6.25 MG tablet Take 1 tablet (6.25 mg total) by mouth 2 (two) times daily. Patient taking differently: Take 6.25 mg by mouth daily.  07/09/17 01/05/18  Molt, Toma Copier, DO  cetirizine (ZYRTEC ALLERGY) 10 MG tablet Take 1 tablet (10 mg total) by mouth daily. Patient not taking: Reported on 09/17/2017 05/31/17   Emi Holes, PA-C  gabapentin (NEURONTIN) 300 MG capsule Take 1 capsule (300 mg total) by mouth at bedtime. 12/25/17   Rehman, Areeg N, DO  guaiFENesin  (MUCINEX) 600 MG 12 hr tablet Take 600 mg by mouth 2 (two) times daily.    [provider]  hydrOXYzine (ATARAX/VISTARIL) 25 MG tablet TAKE 1 TABLET BY MOUTH ONCE DAILY AS NEEDED FOR ITCHING (BLADDER  PAIN) Patient not taking: No sig reported 09/03/17   Molt, Bethany, DO  lisinopril (PRINIVIL,ZESTRIL) 40 MG tablet Take 1 tablet (40 mg total) by mouth daily. 12/29/17   Rehman, Areeg N, DO  metFORMIN (GLUCOPHAGE) 1000 MG tablet Take 1 tablet (1,000 mg total) by mouth 2 (two) times daily with a meal. 04/28/17   Molt, Bethany, DO  Phenazopyridine HCl (AZO TABS PO) Take 2 tablets by mouth daily as needed (bladder pain).    [provider]  sodium chloride (OCEAN) 0.65 % SOLN nasal spray Place 1 spray into both nostrils as needed for congestion. 04/05/18   Emi Holes, PA-C    Family History Family History  Problem Relation Age of Onset  . Breast cancer Mother   . Breast cancer Sister   . Breast cancer Maternal Aunt     Social History Social History   Tobacco Use  . Smoking status: Current Every Day Smoker    Packs/day: 1.00    Types: Cigarettes    Last attempt to quit: 12/11/2012    Years since quitting: 5.3  . Smokeless tobacco: Never Used  . Tobacco comment: 1 pack ever 3 days  Substance Use Topics  . Alcohol use: Yes    Comment: Beer on weekends  . Drug use: No     Allergies   Patient has no known allergies.   Review of Systems Review of Systems  Constitutional: Negative for fever.  Eyes: Negative for visual disturbance.  Respiratory: Negative for shortness of breath.   Cardiovascular: Negative for chest pain.  Gastrointestinal: Negative for abdominal pain.  Neurological: Negative for dizziness, syncope, weakness, numbness and headaches.  All other systems reviewed and are negative.    Physical Exam Updated Vital Signs BP (!) 166/94 (BP Location: Right Arm)   Pulse 64   Temp 97.8 F (36.6 C) (Oral)   Resp 18   Ht 5\' 3"  (1.6 m)   Wt 75 kg    SpO2 97%   BMI 29.29 kg/m   Physical Exam Vitals signs and nursing note reviewed.  Constitutional:      General: She is not in acute distress.    Appearance: Normal appearance. She is well-developed. She is not ill-appearing.  HENT:     Head: Normocephalic and atraumatic.     Right Ear: Tympanic membrane normal.     Left Ear: Tympanic membrane normal.  Eyes:  General: No scleral icterus.       Right eye: No discharge.        Left eye: No discharge.     Conjunctiva/sclera: Conjunctivae normal.     Pupils: Pupils are equal, round, and reactive to light.  Neck:     Musculoskeletal: Normal range of motion.  Cardiovascular:     Rate and Rhythm: Normal rate and regular rhythm.  Pulmonary:     Effort: Pulmonary effort is normal. No respiratory distress.     Breath sounds: Normal breath sounds.  Abdominal:     General: There is no distension.  Skin:    General: Skin is warm and dry.  Neurological:     Mental Status: She is alert and oriented to person, place, and time.     Comments: Lying on stretcher in NAD. GCS 15. Speaks in a clear voice. Cranial nerves II through XII grossly intact. 5/5 strength in all extremities. Sensation fully intact.  Bilateral finger-to-nose intact. Ambulatory   Psychiatric:        Behavior: Behavior normal.      ED Treatments / Results  Labs (all labs ordered are listed, but only abnormal results are displayed) Labs Reviewed - No data to display  EKG None  Radiology No results found.  Procedures Procedures (including critical care time)  Medications Ordered in ED Medications - No data to display   Initial Impression / Assessment and Plan / ED Course  I have reviewed the triage vital signs and the nursing notes.  Pertinent labs & imaging results that were available during my care of the patient were reviewed by me and considered in my medical decision making (see chart for details).  53 year old female presents with elevated blood  pressure at home.  Her blood pressure is 166/94 here.  Blood pressures she showed me from home are normal ranging from 130-139/80-86.  She does not have any specific symptoms and she is primarily anxious about the numbers and doesn't know what they are supposed to be.  Heart is regular rate and rhythm, lungs are clear to auscultation.  She has a normal neurologic exam.  Advised to check blood pressure only once in the morning and keep a log.  Advised to return if she has any worsening symptoms but otherwise she can follow-up with internal medicine clinic.  Final Clinical Impressions(s) / ED Diagnoses   Final diagnoses:  Worried well    ED Discharge Orders    None       Bethel Born, PA-C 04/11/18 1614    Mancel Bale, MD 04/11/18 2227

## 2018-04-11 NOTE — ED Triage Notes (Signed)
Pt states that her BP won't come down with her cardivilol and lisinopril. Pt unaware of what BP is. Asymptomatic.

## 2018-04-11 NOTE — Discharge Instructions (Signed)
Please continue to take your medicines as prescribed Keep a log of your blood pressure - take once in the morning Check your blood sugar once a week or if you feel like it's running low Follow up with your doctor

## 2018-04-14 ENCOUNTER — Other Ambulatory Visit: Payer: Self-pay

## 2018-04-14 ENCOUNTER — Other Ambulatory Visit: Payer: Self-pay | Admitting: Internal Medicine

## 2018-04-14 DIAGNOSIS — Z1231 Encounter for screening mammogram for malignant neoplasm of breast: Secondary | ICD-10-CM

## 2018-04-23 ENCOUNTER — Other Ambulatory Visit: Payer: Self-pay | Admitting: *Deleted

## 2018-04-23 DIAGNOSIS — R809 Proteinuria, unspecified: Principal | ICD-10-CM

## 2018-04-23 DIAGNOSIS — E1129 Type 2 diabetes mellitus with other diabetic kidney complication: Secondary | ICD-10-CM

## 2018-04-24 MED ORDER — ATORVASTATIN CALCIUM 40 MG PO TABS: 40.0000 mg | ORAL_TABLET | Freq: Every day | ORAL | 1 refills | Status: DC

## 2018-05-09 ENCOUNTER — Telehealth: Payer: Self-pay | Admitting: Internal Medicine

## 2018-05-09 NOTE — Telephone Encounter (Signed)
   Reason for call:   I received a call from Ms. Neville Route Meany at 5:39 PM indicating that the patient was concerned for her leg pain.   Pertinent Data:   One year history of bilateral peripheral neuropathy that she has been on Gabapentin 300mg  QHS which she has been taking twice daily for the past 3 days. She has additionally been taking tylenol for her leg pain that she feels is different from her prior pain for which she was prescribed the gabapentin. The new pain is described as beginning a few days prior when she stopped sitting around and began cleaning and walking, located in the calfs bilaterally, worse after a long day of cleaning her house and moving around. Her feet and legs are warm, normal in color and absent swelling or asymmetry. The pain improved with walking or is at the very least less noticeable with walking and is most noticeable at rest. She denotes varicose veins which are chronic and unchanged. The pain is dull or sore like in nature, it is not sharp, stabbing or burning. She denied fever chills, joint pain, bruising rash, bleeding, weakness or thigh pain.    Assessment / Plan / Recommendations:   Likely 2/2 to overuse in the setting of deconditioning, I am less concerned for a DVT given the description of her symptoms and her described physical exam legs as well as the nature of her pain. In addition, concern for tendonopathy was considered but did not seem consistent with the location of the pain. Specific myalgias are possible but uncertain if her current therapy or diet would induce this. The pain is not described in a manner that would seem consistent with muscle cramping. There was no description of a rash or external abnormality.   Advised the patient to take her gabapentin as prescribed  Advised the patient to decrease her activity slightly and build up tolerance   As the pain has improved with decreased activity, I have advised her to contact her PCP and  schedule a visit if her symptoms worsen or she develops new symptoms  As always, pt is advised that if symptoms worsen or new symptoms arise, they should go to an urgent care facility or to to ER for further evaluation.   Lanelle Bal, MD   05/09/2018, 5:44 PM

## 2018-05-22 ENCOUNTER — Emergency Department (HOSPITAL_COMMUNITY)

## 2018-05-22 ENCOUNTER — Other Ambulatory Visit: Payer: Self-pay

## 2018-05-22 ENCOUNTER — Encounter (HOSPITAL_COMMUNITY): Payer: Self-pay | Admitting: Emergency Medicine

## 2018-05-22 ENCOUNTER — Observation Stay (HOSPITAL_COMMUNITY)
Admission: EM | Admit: 2018-05-22 | Discharge: 2018-05-23 | Disposition: A | Discharge status: Home / Self Care | Attending: Internal Medicine | Admitting: Internal Medicine

## 2018-05-22 DIAGNOSIS — F4329 Adjustment disorder with other symptoms: Secondary | ICD-10-CM | POA: Diagnosis not present

## 2018-05-22 DIAGNOSIS — G4700 Insomnia, unspecified: Secondary | ICD-10-CM | POA: Diagnosis not present

## 2018-05-22 DIAGNOSIS — I2510 Atherosclerotic heart disease of native coronary artery without angina pectoris: Secondary | ICD-10-CM | POA: Insufficient documentation

## 2018-05-22 DIAGNOSIS — I9589 Other hypotension: Secondary | ICD-10-CM | POA: Insufficient documentation

## 2018-05-22 DIAGNOSIS — Z8611 Personal history of tuberculosis: Secondary | ICD-10-CM | POA: Diagnosis not present

## 2018-05-22 DIAGNOSIS — E861 Hypovolemia: Secondary | ICD-10-CM

## 2018-05-22 DIAGNOSIS — E119 Type 2 diabetes mellitus without complications: Secondary | ICD-10-CM | POA: Diagnosis not present

## 2018-05-22 DIAGNOSIS — Z23 Encounter for immunization: Secondary | ICD-10-CM | POA: Diagnosis not present

## 2018-05-22 DIAGNOSIS — F4310 Post-traumatic stress disorder, unspecified: Secondary | ICD-10-CM | POA: Insufficient documentation

## 2018-05-22 DIAGNOSIS — I700 Atherosclerosis of aorta: Secondary | ICD-10-CM | POA: Diagnosis not present

## 2018-05-22 DIAGNOSIS — Z720 Tobacco use: Secondary | ICD-10-CM

## 2018-05-22 DIAGNOSIS — F329 Major depressive disorder, single episode, unspecified: Secondary | ICD-10-CM

## 2018-05-22 DIAGNOSIS — J479 Bronchiectasis, uncomplicated: Secondary | ICD-10-CM | POA: Diagnosis not present

## 2018-05-22 DIAGNOSIS — E785 Hyperlipidemia, unspecified: Secondary | ICD-10-CM

## 2018-05-22 DIAGNOSIS — R911 Solitary pulmonary nodule: Secondary | ICD-10-CM | POA: Insufficient documentation

## 2018-05-22 DIAGNOSIS — R569 Unspecified convulsions: Secondary | ICD-10-CM | POA: Diagnosis not present

## 2018-05-22 DIAGNOSIS — E7800 Pure hypercholesterolemia, unspecified: Secondary | ICD-10-CM | POA: Diagnosis not present

## 2018-05-22 DIAGNOSIS — Z79899 Other long term (current) drug therapy: Secondary | ICD-10-CM

## 2018-05-22 DIAGNOSIS — F17210 Nicotine dependence, cigarettes, uncomplicated: Secondary | ICD-10-CM | POA: Insufficient documentation

## 2018-05-22 DIAGNOSIS — M25559 Pain in unspecified hip: Secondary | ICD-10-CM

## 2018-05-22 DIAGNOSIS — R55 Syncope and collapse: Secondary | ICD-10-CM | POA: Diagnosis present

## 2018-05-22 DIAGNOSIS — Z7984 Long term (current) use of oral hypoglycemic drugs: Secondary | ICD-10-CM | POA: Diagnosis not present

## 2018-05-22 DIAGNOSIS — E1140 Type 2 diabetes mellitus with diabetic neuropathy, unspecified: Secondary | ICD-10-CM

## 2018-05-22 DIAGNOSIS — Z8669 Personal history of other diseases of the nervous system and sense organs: Secondary | ICD-10-CM

## 2018-05-22 DIAGNOSIS — I10 Essential (primary) hypertension: Secondary | ICD-10-CM | POA: Diagnosis not present

## 2018-05-22 DIAGNOSIS — F1020 Alcohol dependence, uncomplicated: Secondary | ICD-10-CM

## 2018-05-22 DIAGNOSIS — D72829 Elevated white blood cell count, unspecified: Secondary | ICD-10-CM | POA: Insufficient documentation

## 2018-05-22 DIAGNOSIS — E876 Hypokalemia: Secondary | ICD-10-CM | POA: Diagnosis not present

## 2018-05-22 DIAGNOSIS — G8929 Other chronic pain: Secondary | ICD-10-CM

## 2018-05-22 DIAGNOSIS — D649 Anemia, unspecified: Secondary | ICD-10-CM | POA: Diagnosis not present

## 2018-05-22 LAB — CBC WITH DIFFERENTIAL/PLATELET
Abs Immature Granulocytes: 0 10*3/uL (ref 0.00–0.07)
Basophils Absolute: 0.1 10*3/uL (ref 0.0–0.1)
Basophils Relative: 1 %
Eosinophils Absolute: 0.3 10*3/uL (ref 0.0–0.5)
Eosinophils Relative: 5 %
HCT: 27 % — ABNORMAL LOW (ref 36.0–46.0)
Hemoglobin: 9.1 g/dL — ABNORMAL LOW (ref 12.0–15.0)
Lymphocytes Relative: 37 %
Lymphs Abs: 2.5 10*3/uL (ref 0.7–4.0)
MCH: 29.5 pg (ref 26.0–34.0)
MCHC: 33.7 g/dL (ref 30.0–36.0)
MCV: 87.7 fL (ref 80.0–100.0)
Monocytes Absolute: 0 10*3/uL — ABNORMAL LOW (ref 0.1–1.0)
Monocytes Relative: 0 %
Neutro Abs: 3.8 10*3/uL (ref 1.7–7.7)
Neutrophils Relative %: 57 %
Platelets: 322 10*3/uL (ref 150–400)
RBC: 3.08 MIL/uL — ABNORMAL LOW (ref 3.87–5.11)
RDW: 12.5 % (ref 11.5–15.5)
WBC: 6.7 10*3/uL (ref 4.0–10.5)
nRBC: 0 % (ref 0.0–0.2)
nRBC: 0 /100 WBC

## 2018-05-22 LAB — COMPREHENSIVE METABOLIC PANEL
ALT: 14 U/L (ref 0–44)
AST: 12 U/L — ABNORMAL LOW (ref 15–41)
Albumin: 2.2 g/dL — ABNORMAL LOW (ref 3.5–5.0)
Alkaline Phosphatase: 58 U/L (ref 38–126)
Anion gap: 9 (ref 5–15)
BUN: <5 mg/dL — ABNORMAL LOW (ref 6–20)
CO2: 14 mmol/L — ABNORMAL LOW (ref 22–32)
Calcium: 5.6 mg/dL — CL (ref 8.9–10.3)
Chloride: 116 mmol/L — ABNORMAL HIGH (ref 98–111)
Creatinine, Ser: 0.46 mg/dL (ref 0.44–1.00)
GFR calc Af Amer: >60 mL/min (ref >60)
GFR calc non Af Amer: >60 mL/min (ref >60)
Glucose, Bld: 76 mg/dL (ref 70–99)
Potassium: 3 mmol/L — ABNORMAL LOW (ref 3.5–5.1)
Sodium: 139 mmol/L (ref 135–145)
Total Bilirubin: 0.3 mg/dL (ref 0.3–1.2)
Total Protein: 4.2 g/dL — ABNORMAL LOW (ref 6.5–8.1)

## 2018-05-22 LAB — CBG MONITORING, ED: Glucose-Capillary: 101 mg/dL — ABNORMAL HIGH (ref 70–99)

## 2018-05-22 LAB — GLUCOSE, CAPILLARY: Glucose-Capillary: 101 mg/dL — ABNORMAL HIGH (ref 70–99)

## 2018-05-22 LAB — TROPONIN I: Troponin I: <0.03 ng/mL (ref <0.03)

## 2018-05-22 LAB — D-DIMER, QUANTITATIVE: D-Dimer, Quant: 1.34 ug/mL-FEU — ABNORMAL HIGH (ref 0.00–0.50)

## 2018-05-22 MED ORDER — ADULT MULTIVITAMIN W/MINERALS CH
1.0000 | ORAL_TABLET | Freq: Every day | ORAL | Status: DC
  Administered 2018-05-23: 1 via ORAL
  Filled 2018-05-22: qty 1

## 2018-05-22 MED ORDER — SENNOSIDES-DOCUSATE SODIUM 8.6-50 MG PO TABS: 1.0000 | ORAL_TABLET | Freq: Every evening | ORAL | Status: DC | PRN

## 2018-05-22 MED ORDER — POTASSIUM CHLORIDE CRYS ER 20 MEQ PO TBCR
40.0000 meq | EXTENDED_RELEASE_TABLET | Freq: Once | ORAL | Status: AC
  Administered 2018-05-22: 40 meq via ORAL
  Filled 2018-05-22: qty 2

## 2018-05-22 MED ORDER — ENOXAPARIN SODIUM 40 MG/0.4ML ~~LOC~~ SOLN
40.0000 mg | SUBCUTANEOUS | Status: DC
  Administered 2018-05-22: 40 mg via SUBCUTANEOUS
  Filled 2018-05-22 (×2): qty 0.4

## 2018-05-22 MED ORDER — ACETAMINOPHEN 650 MG RE SUPP: 650.0000 mg | Freq: Four times a day (QID) | RECTAL | Status: DC | PRN

## 2018-05-22 MED ORDER — PROMETHAZINE HCL 25 MG PO TABS: 12.5000 mg | ORAL_TABLET | Freq: Four times a day (QID) | ORAL | Status: DC | PRN

## 2018-05-22 MED ORDER — IOHEXOL 350 MG/ML SOLN
100.0000 mL | Freq: Once | INTRAVENOUS | Status: AC | PRN
  Administered 2018-05-22: 100 mL via INTRAVENOUS

## 2018-05-22 MED ORDER — SODIUM CHLORIDE 0.9 % IV BOLUS
2000.0000 mL | Freq: Once | INTRAVENOUS | Status: AC
  Administered 2018-05-22: 2000 mL via INTRAVENOUS

## 2018-05-22 MED ORDER — MAGNESIUM OXIDE 400 (241.3 MG) MG PO TABS
800.0000 mg | ORAL_TABLET | Freq: Once | ORAL | Status: AC
  Administered 2018-05-22: 800 mg via ORAL
  Filled 2018-05-22: qty 2

## 2018-05-22 MED ORDER — SODIUM CHLORIDE 0.9 % IV SOLN: 1.0000 g | Freq: Once | INTRAVENOUS | Status: DC

## 2018-05-22 MED ORDER — VITAMIN B-1 100 MG PO TABS
100.0000 mg | ORAL_TABLET | Freq: Every day | ORAL | Status: DC
  Administered 2018-05-23: 100 mg via ORAL
  Filled 2018-05-22: qty 1

## 2018-05-22 MED ORDER — GABAPENTIN 300 MG PO CAPS: 300.0000 mg | ORAL_CAPSULE | Freq: Two times a day (BID) | ORAL | Status: DC | PRN

## 2018-05-22 MED ORDER — LORAZEPAM 1 MG PO TABS: 0.0000 mg | ORAL_TABLET | Freq: Two times a day (BID) | ORAL | Status: DC

## 2018-05-22 MED ORDER — LORAZEPAM 2 MG/ML IJ SOLN: 1.0000 mg | Freq: Four times a day (QID) | INTRAMUSCULAR | Status: DC | PRN

## 2018-05-22 MED ORDER — PNEUMOCOCCAL VAC POLYVALENT 25 MCG/0.5ML IJ INJ
0.5000 mL | INJECTION | INTRAMUSCULAR | Status: AC
  Administered 2018-05-23: 0.5 mL via INTRAMUSCULAR
  Filled 2018-05-22: qty 0.5

## 2018-05-22 MED ORDER — FOLIC ACID 1 MG PO TABS
1.0000 mg | ORAL_TABLET | Freq: Every day | ORAL | Status: DC
  Administered 2018-05-23: 1 mg via ORAL
  Filled 2018-05-22: qty 1

## 2018-05-22 MED ORDER — VITAMIN D 25 MCG (1000 UNIT) PO TABS
1000.0000 [IU] | ORAL_TABLET | Freq: Every day | ORAL | Status: DC
  Administered 2018-05-23: 10:00:00 1000 [IU] via ORAL
  Filled 2018-05-22: qty 1

## 2018-05-22 MED ORDER — INSULIN ASPART 100 UNIT/ML ~~LOC~~ SOLN: 0.0000 [IU] | Freq: Three times a day (TID) | SUBCUTANEOUS | Status: DC

## 2018-05-22 MED ORDER — ATORVASTATIN CALCIUM 40 MG PO TABS
40.0000 mg | ORAL_TABLET | Freq: Every day | ORAL | Status: DC
  Administered 2018-05-23: 40 mg via ORAL
  Filled 2018-05-22: qty 1

## 2018-05-22 MED ORDER — THIAMINE HCL 100 MG/ML IJ SOLN
100.0000 mg | Freq: Every day | INTRAMUSCULAR | Status: DC
  Filled 2018-05-22: qty 2

## 2018-05-22 MED ORDER — LORAZEPAM 1 MG PO TABS: 1.0000 mg | ORAL_TABLET | Freq: Four times a day (QID) | ORAL | Status: DC | PRN

## 2018-05-22 MED ORDER — ACETAMINOPHEN 325 MG PO TABS
650.0000 mg | ORAL_TABLET | Freq: Four times a day (QID) | ORAL | Status: DC | PRN
  Administered 2018-05-22: 650 mg via ORAL
  Filled 2018-05-22: qty 2

## 2018-05-22 MED ORDER — CALCIUM GLUCONATE-NACL 1-0.675 GM/50ML-% IV SOLN
1.0000 g | Freq: Once | INTRAVENOUS | Status: AC
  Administered 2018-05-22: 1000 mg via INTRAVENOUS
  Filled 2018-05-22: qty 50

## 2018-05-22 MED ORDER — LACTATED RINGERS IV SOLN
INTRAVENOUS | Status: AC
  Administered 2018-05-22: 23:00:00 via INTRAVENOUS

## 2018-05-22 MED ORDER — ENSURE ENLIVE PO LIQD: 237.0000 mL | Freq: Two times a day (BID) | ORAL | Status: DC

## 2018-05-22 MED ORDER — LORAZEPAM 1 MG PO TABS: 0.0000 mg | ORAL_TABLET | Freq: Four times a day (QID) | ORAL | Status: DC

## 2018-05-22 MED ORDER — DM-APAP-CPM 10-325-2 MG PO TABS: ORAL_TABLET | Freq: Every day | ORAL | Status: DC | PRN

## 2018-05-22 NOTE — ED Provider Notes (Signed)
[facility] EMERGENCY DEPARTMENT Provider Note   CSN: [id number] Arrival date & time: 05/22/18  1530    History   Chief Complaint Chief Complaint  Patient presents with   Loss of Consciousness    HPI [name] is a 53 y.o. female.     53 yo F with a chief complaint of a syncopal event.  Patient states that she is felt unwell today she went to get money from a convenience store and while she was standing at the register she felt worse.  States that she had a terrible left-sided headache and then woke up on the ground.  The headache now is resolved.  EMS was called and the patient was found to be profoundly hypertensive with initial blood pressures in the 50s.  She was started on an epinephrine drip and brought here.  Patient feels somewhat better but still feels unwell.  She had one episode of vomiting earlier today when she tried to take her home medications.  Denies chest pain or trouble breathing.  Had significant diaphoresis just before the event.  The history is provided by the patient and the EMS personnel.  Loss of Consciousness  Episode history:  Single Most recent episode:  Today Duration:  2 minutes Timing:  Rare Progression:  Resolved Chronicity:  New Context comment:  Standing at a counter Witnessed: yes   Relieved by:  Lying down Worsened by:  Nothing Ineffective treatments:  None tried Associated symptoms: headaches   Associated symptoms: no chest pain, no dizziness, no fever, no nausea, no palpitations, no shortness of breath and no vomiting     Past Medical History:  Diagnosis Date   Anemia    when in late teens   Anxiety    Breast abscess    Chlamydia    Depression    PTSD also   Diabetes mellitus without complication (HCC)    Foot pain, bilateral    "burning"   Gonorrhea    High cholesterol    Hypercholesteremia    Hypertension    Recurrent upper respiratory infection (URI)    URI/SOB-treated with  antibiotics-cleared up now   Seizures (HCC)    Substance abuse (HCC)    alcohol   Tuberculosis    was treated    Patient Active Problem List   Diagnosis Date Noted   Grief 12/25/2017   Near syncope 12/22/2017   AKI (acute kidney injury) (HCC) 12/22/2017   Postural dizziness with presyncope 12/22/2017   Hypotension 12/22/2017   Healthcare maintenance 11/12/2016   Occipital neuralgia of left side 06/21/2016   Dysuria 05/21/2016   Avascular necrosis (HCC) 03/01/2016   Hyponatremia 08/29/2015   Tobacco use disorder 04/12/2015   Abnormal brain MRI 03/10/2015   Elevated liver enzymes 04/14/2013   Diabetic neuropathy, painful (HCC) 04/14/2013   Alcohol dependence (HCC) 03/23/2013   Substance induced mood disorder (HCC) 03/22/2013   Depression 02/05/2012   Hyperlipidemia 02/05/2012   Hypertension 01/07/2012   Type 2 diabetes mellitus with microalbuminuria, without long-term current use of insulin (HCC) 01/07/2012    Past Surgical History:  Procedure Laterality Date   BREAST BIOPSY  11/13/2010   BREAST LUMPECTOMY     INCISE AND DRAIN ABCESS  01/03/11   breast abscess   TONGUE SURGERY  2008   lump removed     OB History    Gravida  2   Para  2   Term  2   Preterm      AB  Living  2     SAB      TAB      Ectopic      Multiple      Live Births               Home Medications    Prior to Admission medications   Medication Sig Start Date End Date Taking? Authorizing Provider  atorvastatin (LIPITOR) 40 MG tablet Take 1 tablet (40 mg total) by mouth daily for 30 days. IM Program. 04/24/18 05/24/18 Yes Bloomfield, Carley D, DO  carvedilol (COREG) 6.25 MG tablet Take 1 tablet (6.25 mg total) by mouth 2 (two) times daily. Patient taking differently: Take 6.25 mg by mouth 2 (two) times daily with a meal.  07/09/17 06/19/18 Yes Molt, Bethany, DO  DM-APAP-CPM (CORICIDIN HBP PO) Take 1 tablet by mouth daily as needed (allergies (watery  eyes - runny nose)).   Yes [provider]  gabapentin (NEURONTIN) 300 MG capsule Take 1 capsule (300 mg total) by mouth at bedtime. Patient taking differently: Take 300 mg by mouth 2 (two) times daily as needed (nerve pain).  12/25/17  Yes Rehman, Areeg N, DO  ibuprofen (ADVIL) 200 MG tablet Take 400-600 mg by mouth every 6 (six) hours as needed for headache.   Yes [provider]  lisinopril (PRINIVIL,ZESTRIL) 40 MG tablet Take 1 tablet (40 mg total) by mouth daily. 12/29/17  Yes Rehman, Areeg N, DO  Menthol-Methyl Salicylate (MUSCLE RUB EX) Apply 1 application topically 2 (two) times daily as needed (pain).   Yes [provider]  metFORMIN (GLUCOPHAGE) 1000 MG tablet Take 1 tablet (1,000 mg total) by mouth 2 (two) times daily with a meal. 04/28/17  Yes Molt, Bethany, DO  sodium chloride (OCEAN) 0.65 % SOLN nasal spray Place 1 spray into both nostrils as needed for congestion. Patient taking differently: Place 1 spray into both nostrils daily as needed for congestion.  04/05/18  Yes Law, Waylan BogaAlexandra M, PA-C  acetaminophen (TYLENOL) 325 MG tablet Take 2 tablets (650 mg total) by mouth every 6 (six) hours as needed for moderate pain. Patient not taking: Reported on 05/22/2018 04/05/18   Emi HolesLaw, Alexandra M, PA-C  cetirizine (ZYRTEC ALLERGY) 10 MG tablet Take 1 tablet (10 mg total) by mouth daily. Patient not taking: Reported on 09/17/2017 05/31/17   Emi HolesLaw, Alexandra M, PA-C  hydrOXYzine (ATARAX/VISTARIL) 25 MG tablet TAKE 1 TABLET BY MOUTH ONCE DAILY AS NEEDED FOR ITCHING (BLADDER  PAIN) Patient not taking: No sig reported 09/03/17   Molt, Toma CopierBethany, DO    Family History Family History  Problem Relation Age of Onset   Breast cancer Mother    Breast cancer Sister    Breast cancer Maternal Aunt     Social History Social History   Tobacco Use   Smoking status: Current Every Day Smoker    Packs/day: 1.00    Types: Cigarettes    Last attempt to quit: 12/11/2012    Years since  quitting: 5.4   Smokeless tobacco: Never Used   Tobacco comment: 1 pack ever 3 days  Substance Use Topics   Alcohol use: Yes    Comment: Beer on weekends   Drug use: No     Allergies   Patient has no known allergies.   Review of Systems Review of Systems  Constitutional: Negative for chills and fever.  HENT: Negative for congestion and rhinorrhea.   Eyes: Negative for redness and visual disturbance.  Respiratory: Negative for shortness of breath and wheezing.  Cardiovascular: Positive for syncope. Negative for chest pain and palpitations.  Gastrointestinal: Negative for nausea and vomiting.  Genitourinary: Negative for dysuria and urgency.  Musculoskeletal: Negative for arthralgias and myalgias.  Skin: Negative for pallor and wound.  Neurological: Positive for syncope and headaches. Negative for dizziness.     Physical Exam Updated Vital Signs BP 111/71    Pulse 75    Temp (!) 96.5 F (35.8 C) (Oral)    Resp 16    Ht 5\' 3"  (1.6 m)    Wt 74.4 kg    LMP 01/22/2013    SpO2 96%    BMI 29.05 kg/m   Physical Exam Vitals signs and nursing note reviewed.  Constitutional:      General: She is not in acute distress.    Appearance: She is well-developed. She is not diaphoretic.  HENT:     Head: Normocephalic and atraumatic.  Eyes:     Pupils: Pupils are equal, round, and reactive to light.  Neck:     Musculoskeletal: Normal range of motion and neck supple.  Cardiovascular:     Rate and Rhythm: Normal rate and regular rhythm.     Heart sounds: No murmur. No friction rub. No gallop.   Pulmonary:     Effort: Pulmonary effort is normal.     Breath sounds: No wheezing or rales.  Abdominal:     General: There is no distension.     Palpations: Abdomen is soft.     Tenderness: There is no abdominal tenderness.  Musculoskeletal:        General: No tenderness.  Skin:    General: Skin is warm and dry.  Neurological:     Mental Status: She is alert and oriented to person,  place, and time.     Cranial Nerves: Cranial nerves are intact.     Sensory: Sensation is intact.     Motor: Motor function is intact.     Coordination: Coordination is intact.  Psychiatric:        Behavior: Behavior normal.      ED Treatments / Results  Labs (all labs ordered are listed, but only abnormal results are displayed) Labs Reviewed  CBC WITH DIFFERENTIAL/PLATELET - Abnormal; Notable for the following components:      Result Value   RBC 3.08 (*)    Hemoglobin 9.1 (*)    HCT 27.0 (*)    Monocytes Absolute 0.0 (*)    All other components within normal limits  COMPREHENSIVE METABOLIC PANEL - Abnormal; Notable for the following components:   Potassium 3.0 (*)    Chloride 116 (*)    CO2 14 (*)    BUN <5 (*)    Calcium 5.6 (*)    Total Protein 4.2 (*)    Albumin 2.2 (*)    AST 12 (*)    All other components within normal limits  D-DIMER, QUANTITATIVE (NOT AT [facility]) - Abnormal; Notable for the following components:   D-Dimer, Quant 1.34 (*)    All other components within normal limits  TROPONIN I  PARATHYROID HORMONE, INTACT (NO CA)  CALCIUM, IONIZED    EKG EKG Interpretation  Date/Time:  Friday May 22 2018 15:33:18 EDT Ventricular Rate:  74 PR Interval:    QRS Duration: 101 QT Interval:  413 QTC Calculation: 459 R Axis:   54 Text Interpretation:  Sinus rhythm Prolonged PR interval Nonspecific T abnormalities, anterior leads No significant change since last tracing Confirmed by Melene Plan [phone]) on 05/22/2018 3:40:12 PM  Radiology Ct Head Wo Contrast  Result Date: 05/22/2018 CLINICAL DATA:  Acute severe headache worst of life EXAM: CT HEAD WITHOUT CONTRAST TECHNIQUE: Contiguous axial images were obtained from the base of the skull through the vertex without intravenous contrast. Sagittal and coronal MPR images reconstructed from axial data set. COMPARISON:  12/30/2011 FINDINGS: Brain: Normal ventricular morphology. No midline shift or mass effect. Mild small  vessel chronic ischemic changes of deep cerebral white matter. Otherwise normal appearance of brain parenchyma. No intracranial hemorrhage, mass lesion, evidence of acute infarction, or extra-axial fluid collection. Vascular: No hyperdense vessels Skull: Intact Sinuses/Orbits: Clear Other: N/A IMPRESSION: Mild small vessel chronic ischemic changes of deep cerebral white matter. No acute intracranial abnormalities. Electronically Signed   By: Ulyses Southward M.D.   On: 05/22/2018 17:32   Ct Angio Chest Pe W And/or Wo Contrast  Result Date: 05/22/2018 CLINICAL DATA:  Elevated D-dimer.  Syncope.  Hypotension. EXAM: CT ANGIOGRAPHY CHEST WITH CONTRAST TECHNIQUE: Multidetector CT imaging of the chest was performed using the standard protocol during bolus administration of intravenous contrast. Multiplanar CT image reconstructions and MIPs were obtained to evaluate the vascular anatomy. CONTRAST:  OMNIPAQUE IOHEXOL 350 MG/ML SOLN COMPARISON:  Chest radiograph from earlier today. 12/22/2017 chest CT angiogram. FINDINGS: Cardiovascular: The study is high quality for the evaluation of pulmonary embolism. There are no convincing filling defects in the central, lobar, segmental or subsegmental pulmonary artery branches to suggest acute pulmonary embolism. Mildly atherosclerotic nonaneurysmal thoracic aorta. Normal caliber pulmonary arteries. Normal heart size. No significant pericardial fluid/thickening. Right coronary atherosclerosis. Mediastinum/Nodes: No discrete thyroid nodules. Unremarkable esophagus. No pathologically enlarged axillary, mediastinal or hilar lymph nodes. Lungs/Pleura: No pneumothorax. No pleural effusion. Peripheral right lower lobe 5 mm pulmonary nodule (series 6/image 93), stable since 12/22/2017 chest CT. Diffuse bronchial wall thickening, unchanged. Mild cylindrical bronchiectasis in the left lower lobe, unchanged. No acute consolidative airspace disease, lung masses or additional significant  pulmonary nodules. Upper abdomen: Probable small hiatal hernia. Musculoskeletal:  No aggressive appearing focal osseous lesions. Review of the MIP images confirms the above findings. IMPRESSION: 1. No pulmonary embolism.  No acute pulmonary disease. 2. Chronic diffuse bronchial wall thickening and mild cylindrical left lower lobe bronchiectasis. 3. Solitary 5 mm right lower lobe pulmonary nodule, stable since 12/22/2017 chest CT, probably benign. No follow-up needed if patient is low-risk. Non-contrast chest CT can be considered in 12 months if patient is high-risk. This recommendation follows the consensus statement: Guidelines for Management of Incidental Pulmonary Nodules Detected on CT Images:From the Fleischner Society 2017; published online before print (10.1148/radiol.[id number]). 4. One vessel coronary atherosclerosis. Aortic Atherosclerosis (ICD10-I70.0). Electronically Signed   By: Delbert Phenix M.D.   On: 05/22/2018 17:36   Dg Chest Port 1 View  Result Date: 05/22/2018 CLINICAL DATA:  53 year old female with syncope and low blood pressure EXAM: PORTABLE CHEST 1 VIEW COMPARISON:  CT 12/22/2017, plain film 12/22/2017 FINDINGS: Cardiomediastinal silhouette unchanged in size and contour. No evidence of central vascular congestion. No pleural effusion or pneumothorax. No confluent airspace disease. No displaced fracture IMPRESSION: Negative for acute cardiopulmonary disease Electronically Signed   By: Gilmer Mor D.O.   On: 05/22/2018 16:06    Procedures Procedures (including critical care time)  Medications Ordered in ED Medications  calcium gluconate 1 g/ 50 mL sodium chloride IVPB (has no administration in time range)  sodium chloride 0.9 % bolus 2,000 mL (0 mLs Intravenous Stopped 05/22/18 1819)  potassium chloride SA (K-DUR) CR tablet 40 mEq (40 mEq  Oral Given 05/22/18 1719)  magnesium oxide (MAG-OX) tablet 800 mg (800 mg Oral Given 05/22/18 1717)  iohexol (OMNIPAQUE) 350 MG/ML injection 100 mL  (100 mLs Intravenous Contrast Given 05/22/18 1658)     Initial Impression / Assessment and Plan / ED Course  I have reviewed the triage vital signs and the nursing notes.  Pertinent labs & imaging results that were available during my care of the patient were reviewed by me and considered in my medical decision making (see chart for details).        53 yo F with a syncopal event while at a convenience store while she was getting money to pay her bills.  Patient felt terrible just prior was having headache and generalized weakness.  Woke up on the ground.  Feeling a bit better and headache is resolved but still feels unwell.  She was started on an epinephrine drip in route by EMS due to profound hypotension.  Blood pressure initially was in the 50s systolic.  Old records were reviewed and the patient actually had a similar presentation about 6 months ago.  At that time her d-dimer was positive but it was negative for PE.  She was not complaining of a headache at that time.  Presumed to be profound hypotension secondary to dehydration.  Patient continues to feel well.  She did have a hemoglobin that was slightly lower than her baseline as well as a calcium level that was critically low.  We will send off a PTH and ionized calcium.  Give a dose of calcium magnesium and potassium here.  Discussed with internal medicine residency service for admission.  CRITICAL CARE Performed by: Rae Roam   Total critical care time: 35 minutes  Critical care time was exclusive of separately billable procedures and treating other patients.  Critical care was necessary to treat or prevent imminent or life-threatening deterioration.  Critical care was time spent personally by me on the following activities: development of treatment plan with patient and/or surrogate as well as nursing, discussions with consultants, evaluation of patient's response to treatment, examination of patient, obtaining  history from patient or surrogate, ordering and performing treatments and interventions, ordering and review of laboratory studies, ordering and review of radiographic studies, pulse oximetry and re-evaluation of patient's condition.  The patients results and plan were reviewed and discussed.   Any x-rays performed were independently reviewed by myself.   Differential diagnosis were considered with the presenting HPI.  Medications  calcium gluconate 1 g/ 50 mL sodium chloride IVPB (has no administration in time range)  sodium chloride 0.9 % bolus 2,000 mL (0 mLs Intravenous Stopped 05/22/18 1819)  potassium chloride SA (K-DUR) CR tablet 40 mEq (40 mEq Oral Given 05/22/18 1719)  magnesium oxide (MAG-OX) tablet 800 mg (800 mg Oral Given 05/22/18 1717)  iohexol (OMNIPAQUE) 350 MG/ML injection 100 mL (100 mLs Intravenous Contrast Given 05/22/18 1658)    Vitals:   05/22/18 1706 05/22/18 1730 05/22/18 1800 05/22/18 1815  BP: 125/75 118/78 102/76 111/71  Pulse: 88 77 78 75  Resp: Temp:      TempSrc:      SpO2: 98% 97% 97% 96%  Weight:      Height:        Final diagnoses:  Syncope and collapse  Hypotension due to hypovolemia    Admission/ observation were discussed with the admitting physician, patient and/or family and they are comfortable with the plan.   Final Clinical Impressions(s) /  ED Diagnoses   Final diagnoses:  Syncope and collapse  Hypotension due to hypovolemia    ED Discharge Orders    None       Melene Plan, DO 05/22/18 1827

## 2018-05-22 NOTE — ED Triage Notes (Signed)
Per [facility] pt coming from convenient store where she had a witnessed syncopal episode, denies hitting head. EMS reports patient was still unresoponsive when got to patient and could not palpate BP. Finally able to get BP 50/32 and given 600CC NS and EPI drip started 2.  Patient alert and orientated during triage, neuro in tact. EDP at bedside assessing patient. EPI drip discontinued per EDP.

## 2018-05-22 NOTE — ED Notes (Addendum)
ED TO INPATIENT HANDOFF REPORT  ED Nurse Name and Phone #:  Inetta Fermoina @ [phone]  S Name/Age/Gender [name] 53 y.o. female Room/Bed: 030C/030C  Code Status   Code Status: Full Code  Home/SNF/Other Home Patient oriented to: self, place, time and situation Is this baseline? Yes   Triage Complete: Triage complete  Chief Complaint HTN; Syncope  Triage Note Per [facility] pt coming from convenient store where she had a witnessed syncopal episode, denies hitting head. EMS reports patient was still unresoponsive when got to patient and could not palpate BP. Finally able to get BP 50/32 and given 600CC NS and EPI drip started 2.  Patient alert and orientated during triage, neuro in tact. EDP at bedside assessing patient. EPI drip discontinued per EDP.    Allergies No Known Allergies  Level of Care/Admitting Diagnosis ED Disposition    ED Disposition Condition Comment   Admit  [facility] [[id number]]  Level of Care: Med-Surg [16]  Covid Evaluation: N/A  Diagnosis: Vasovagal syncope [[id number]][[id number]]  Admitting Physician: Inez CatalinaMULLEN, EMILY B [phone][[id number]]  Attending Physician: Inez CatalinaMULLEN, EMILY B [[id number]]  PT Class (Do Not Modify): Observation [104]  PT Acc Code (Do Not Modify): Observation [10022]       B Medical/Surgery History Past Medical History:  Diagnosis Date  . Anemia    when in late teens  . Anxiety   . Breast abscess   . Chlamydia   . Depression    PTSD also  . Diabetes mellitus without complication (HCC)   . Foot pain, bilateral    "burning"  . Gonorrhea   . High cholesterol   . Hypercholesteremia   . Hypertension   . Recurrent upper respiratory infection (URI)    URI/SOB-treated with antibiotics-cleared up now  . Seizures (HCC)   . Substance abuse (HCC)    alcohol  . Tuberculosis    was treated   Past Surgical History:  Procedure Laterality Date  . BREAST BIOPSY  11/13/2010  . BREAST LUMPECTOMY    . INCISE AND DRAIN ABCESS  01/03/11    breast abscess  . TONGUE SURGERY  2008   lump removed     A IV Location/Drains/Wounds Patient Lines/Drains/Airways Status   Active Line/Drains/Airways    Name:   Placement date:   Placement time:   Site:   Days:   Peripheral IV 05/22/18 Left Antecubital   05/22/18    1542    Antecubital   less than 1   Peripheral IV 05/22/18 Left Forearm   05/22/18    1542    Forearm   less than 1          Intake/Output Last 24 hours  Intake/Output Summary (Last 24 hours) at 05/22/2018 2125 Last data filed at 05/22/2018 1819 Gross per 24 hour  Intake 1000 ml  Output -  Net 1000 ml    Labs/Imaging Results for orders placed or performed during the hospital encounter of 05/22/18 (from the past 48 hour(s))  CBC with Differential     Status: Abnormal   Collection Time: 05/22/18  3:39 PM  Result Value Ref Range   WBC 6.7 4.0 - 10.5 K/uL   RBC 3.08 (L) 3.87 - 5.11 MIL/uL   Hemoglobin 9.1 (L) 12.0 - 15.0 g/dL   HCT 47.827.0 (L) 29.536.0 - 62.146.0 %   MCV 87.7 80.0 - 100.0 fL   MCH 29.5 26.0 - 34.0 pg   MCHC 33.7 30.0 - 36.0 g/dL   RDW 30.812.5 65.711.5 -  15.5 %   Platelets 322 150 - 400 K/uL   nRBC 0.0 0.0 - 0.2 %   Neutrophils Relative % 57 %   Neutro Abs 3.8 1.7 - 7.7 K/uL   Lymphocytes Relative 37 %   Lymphs Abs 2.5 0.7 - 4.0 K/uL   Monocytes Relative 0 %   Monocytes Absolute 0.0 (L) 0.1 - 1.0 K/uL   Eosinophils Relative 5 %   Eosinophils Absolute 0.3 0.0 - 0.5 K/uL   Basophils Relative 1 %   Basophils Absolute 0.1 0.0 - 0.1 K/uL   WBC Morphology See Note     Comment: >10% reactive, Benign Lymphocytes.   nRBC 0 0 /100 WBC   Abs Immature Granulocytes 0.00 0.00 - 0.07 K/uL   Acanthocytes PRESENT     Comment: Performed at [facility], [address], [place], [place] [address]  Comprehensive metabolic panel     Status: Abnormal   Collection Time: 05/22/18  3:39 PM  Result Value Ref Range   Sodium 139 135 - 145 mmol/L   Potassium 3.0 (L) 3.5 - 5.1 mmol/L   Chloride 116 (H) 98 - 111 mmol/L    CO2 14 (L) 22 - 32 mmol/L   Glucose, Bld 76 70 - 99 mg/dL   BUN <5 (L) 6 - 20 mg/dL   Creatinine, Ser 6.04 0.44 - 1.00 mg/dL   Calcium 5.6 (LL) 8.9 - 10.3 mg/dL    Comment: CRITICAL RESULT CALLED TO, READ BACK BY AND VERIFIED WITH: C MARSHALL,RN 1645 05/22/2018 D BRADLEY    Total Protein 4.2 (L) 6.5 - 8.1 g/dL   Albumin 2.2 (L) 3.5 - 5.0 g/dL   AST 12 (L) 15 - 41 U/L   ALT 14 0 - 44 U/L   Alkaline Phosphatase 58 38 - 126 U/L   Total Bilirubin 0.3 0.3 - 1.2 mg/dL   GFR calc non Af Amer >60 >60 mL/min   GFR calc Af Amer >60 >60 mL/min   Anion gap 9 5 - 15    Comment: Performed at [facility], [address] [place], [place] [address]  Troponin I - ONCE - STAT     Status: None   Collection Time: 05/22/18  3:39 PM  Result Value Ref Range   Troponin I <0.03 <0.03 ng/mL    Comment: Performed at [facility], [address] [place], [place] [address]  D-dimer, quantitative (not at [facility])     Status: Abnormal   Collection Time: 05/22/18  3:39 PM  Result Value Ref Range   D-Dimer, Quant 1.34 (H) 0.00 - 0.50 ug/mL-FEU    Comment: (NOTE) At the manufacturer cut-off of 0.50 ug/mL FEU, this assay has been documented to exclude PE with a sensitivity and negative predictive value of 97 to 99%.  At this time, this assay has not been approved by the FDA to exclude DVT/VTE. Results should be correlated with clinical presentation. Performed at [facility], [address], [place], [place] [address]   CBG monitoring, ED     Status: Abnormal   Collection Time: 05/22/18  7:30 PM  Result Value Ref Range   Glucose-Capillary 101 (H) 70 - 99 mg/dL   Ct Head Wo Contrast  Result Date: 05/22/2018 CLINICAL DATA:  Acute severe headache worst of life EXAM: CT HEAD WITHOUT CONTRAST TECHNIQUE: Contiguous axial images were obtained from the base of the skull through the vertex without intravenous contrast. Sagittal and coronal MPR images reconstructed from axial data  set. COMPARISON:   12/30/2011 FINDINGS: Brain: Normal ventricular morphology. No midline shift or mass effect. Mild small vessel chronic ischemic changes of deep cerebral white matter. Otherwise normal appearance of brain parenchyma. No intracranial hemorrhage, mass lesion, evidence of acute infarction, or extra-axial fluid collection. Vascular: No hyperdense vessels Skull: Intact Sinuses/Orbits: Clear Other: N/A IMPRESSION: Mild small vessel chronic ischemic changes of deep cerebral white matter. No acute intracranial abnormalities. Electronically Signed   By: Ulyses Southward M.D.   On: 05/22/2018 17:32   Ct Angio Chest Pe W And/or Wo Contrast  Result Date: 05/22/2018 CLINICAL DATA:  Elevated D-dimer.  Syncope.  Hypotension. EXAM: CT ANGIOGRAPHY CHEST WITH CONTRAST TECHNIQUE: Multidetector CT imaging of the chest was performed using the standard protocol during bolus administration of intravenous contrast. Multiplanar CT image reconstructions and MIPs were obtained to evaluate the vascular anatomy. CONTRAST:  OMNIPAQUE IOHEXOL 350 MG/ML SOLN COMPARISON:  Chest radiograph from earlier today. 12/22/2017 chest CT angiogram. FINDINGS: Cardiovascular: The study is high quality for the evaluation of pulmonary embolism. There are no convincing filling defects in the central, lobar, segmental or subsegmental pulmonary artery branches to suggest acute pulmonary embolism. Mildly atherosclerotic nonaneurysmal thoracic aorta. Normal caliber pulmonary arteries. Normal heart size. No significant pericardial fluid/thickening. Right coronary atherosclerosis. Mediastinum/Nodes: No discrete thyroid nodules. Unremarkable esophagus. No pathologically enlarged axillary, mediastinal or hilar lymph nodes. Lungs/Pleura: No pneumothorax. No pleural effusion. Peripheral right lower lobe 5 mm pulmonary nodule (series 6/image 93), stable since 12/22/2017 chest CT. Diffuse bronchial wall thickening, unchanged. Mild cylindrical bronchiectasis in the left  lower lobe, unchanged. No acute consolidative airspace disease, lung masses or additional significant pulmonary nodules. Upper abdomen: Probable small hiatal hernia. Musculoskeletal:  No aggressive appearing focal osseous lesions. Review of the MIP images confirms the above findings. IMPRESSION: 1. No pulmonary embolism.  No acute pulmonary disease. 2. Chronic diffuse bronchial wall thickening and mild cylindrical left lower lobe bronchiectasis. 3. Solitary 5 mm right lower lobe pulmonary nodule, stable since 12/22/2017 chest CT, probably benign. No follow-up needed if patient is low-risk. Non-contrast chest CT can be considered in 12 months if patient is high-risk. This recommendation follows the consensus statement: Guidelines for Management of Incidental Pulmonary Nodules Detected on CT Images:From the Fleischner Society 2017; published online before print (10.1148/radiol.[id number]). 4. One vessel coronary atherosclerosis. Aortic Atherosclerosis (ICD10-I70.0). Electronically Signed   By: Delbert Phenix M.D.   On: 05/22/2018 17:36   Dg Chest Port 1 View  Result Date: 05/22/2018 CLINICAL DATA:  53 year old female with syncope and low blood pressure EXAM: PORTABLE CHEST 1 VIEW COMPARISON:  CT 12/22/2017, plain film 12/22/2017 FINDINGS: Cardiomediastinal silhouette unchanged in size and contour. No evidence of central vascular congestion. No pleural effusion or pneumothorax. No confluent airspace disease. No displaced fracture IMPRESSION: Negative for acute cardiopulmonary disease Electronically Signed   By: Gilmer Mor D.O.   On: 05/22/2018 16:06    Pending Labs Unresulted Labs (From admission, onward)    Start     Ordered   05/23/18 0500  Basic metabolic panel  Tomorrow morning,   R     05/22/18 2100   05/23/18 0500  CBC  Tomorrow morning,   R     05/22/18 2100   05/23/18 0500  Hemoglobin A1c  Tomorrow morning,   R    Comments:  To assess prior glycemic control    05/22/18 2117   05/22/18 1720   Parathyroid hormone, intact (no Ca)  Once,   R     05/22/18  1719   05/22/18 1720  Calcium, ionized  Once,   R     05/22/18 1719          Vitals/Pain Today's Vitals   05/22/18 1915 05/22/18 1930 05/22/18 2015 05/22/18 2100  BP: 115/76 128/85 131/84 (!) 177/94  Pulse: 72 84 80 78  Resp: 19 16 20 20   Temp:      TempSrc:      SpO2: 96% 99% 97% 94%  Weight:      Height:      PainSc:        Isolation Precautions No active isolations  Medications Medications  enoxaparin (LOVENOX) injection 40 mg (has no administration in time range)  lactated ringers infusion (has no administration in time range)  acetaminophen (TYLENOL) tablet 650 mg (has no administration in time range)    Or  acetaminophen (TYLENOL) suppository 650 mg (has no administration in time range)  senna-docusate (Senokot-S) tablet 1 tablet (has no administration in time range)  promethazine (PHENERGAN) tablet 12.5 mg (has no administration in time range)  atorvastatin (LIPITOR) tablet 40 mg (has no administration in time range)  DM-APAP-CPM 10-325-2 MG TABS (has no administration in time range)  gabapentin (NEURONTIN) capsule 300 mg (has no administration in time range)  insulin aspart (novoLOG) injection 0-15 Units (has no administration in time range)  sodium chloride 0.9 % bolus 2,000 mL (0 mLs Intravenous Stopped 05/22/18 1819)  potassium chloride SA (K-DUR) CR tablet 40 mEq (40 mEq Oral Given 05/22/18 1719)  magnesium oxide (MAG-OX) tablet 800 mg (800 mg Oral Given 05/22/18 1717)  iohexol (OMNIPAQUE) 350 MG/ML injection 100 mL (100 mLs Intravenous Contrast Given 05/22/18 1658)  calcium gluconate 1 g/ 50 mL sodium chloride IVPB (0 g Intravenous Stopped 05/22/18 2054)    Mobility walks Moderate fall risk   Focused Assessments Cardiac Assessment Handoff:  Cardiac Rhythm: Normal sinus rhythm Lab Results  Component Value Date   TROPONINI <0.03 05/22/2018   Lab Results  Component Value Date   DDIMER 1.34 (H)  05/22/2018   Does the Patient currently have chest pain? No     R Recommendations: See Admitting Provider Note  Report given to: Micah, RN (charge)  Additional Notes:  Patient only had a t-shirt on when this nurse took report; no other belongings in room.

## 2018-05-22 NOTE — ED Notes (Signed)
Nurse Navigator note. Pt requested to call patient landlord who is pt primary contact. She has no family around. I spoke with Charleston Ropes, [phone]. He confirmed he secured pt belongings and to please call with disposition. Updated pt on status at this time.

## 2018-05-22 NOTE — H&P (Signed)
Date: 05/22/2018               Patient Name:  [name] MRN: [id number]  DOB: December 20, 1965 Age / Sex: 53 y.o., female   PCP: Bridget Hartshorn, DO         Medical Service: Internal Medicine Teaching Service         Attending Physician: Dr. Inez Catalina, MD    First Contact: Dr. Thornell Mule Pager: [phone]            After Hours (After 5p/  First Contact Pager: [phone]  weekends / holidays): Second Contact Pager: [phone]   Chief Complaint: Syncope   History of Present Illness: Ms. Myer is a 53 year old female with past medical history of DMII, HTN, HLD, history of seizures, who was brought to the emergency department by EMS after a syncopal episode.  She states that she woke up this morning and ate breakfast as usual.  She went to go pay her bills around 2 PM and was waiting in line to pay.  She states that she had been standing there for about 15 minutes when she began to feel very hot and lightheaded.  She could "feel her face draining".  She then abruptly passed out and fell to the floor.  She denies any other prodromal symptoms including chest pain, palpitations, shortness of breath, and dizziness.  She denies bowel or bladder incontinence, biting her tongue, or hitting her head.  EMS was called and her blood pressure was found to be 50/32. She was give 600 cc NS and started on Epinephrine drip.  She reports a history of seizures last occurring 3 years ago.  She believes that it was due to stress and is not currently on any antiepileptic.  She reports taking more have her gabapentin over the last several days (300 mg 2-3 times per day) because of worsening of her diabetic neuropathy.  She denies any other changes in medications.  Her husband died 8 months ago and she states that this has made her have a more depressed mood and decreased appetite.  She reports losing about 10 to 15 pounds since he died.  She has also been very stressed out about her children being  placed in foster care.  She denies SI/HI.  She does report increased alcohol use and states that she has been drinking a sixpack of beer each day.  She drank 140 ounce beer this morning prior to leaving her house.  In the ED, her blood pressure had improved to SBPs of 114-120s. CBG 101. Normocytic anemia with Hb 9.1. Hypokalemia 3.0. Low bicarb 14. Hypocalemia with corrected calcium of 6.6. D-dimer 1.34.  Chest CT unremarkable without signs of PE.   Meds:  Current Meds  Medication Sig  . atorvastatin (LIPITOR) 40 MG tablet Take 1 tablet (40 mg total) by mouth daily for 30 days. IM Program.  . carvedilol (COREG) 6.25 MG tablet Take 1 tablet (6.25 mg total) by mouth 2 (two) times daily. (Patient taking differently: Take 6.25 mg by mouth 2 (two) times daily with a meal. )  . DM-APAP-CPM (CORICIDIN HBP PO) Take 1 tablet by mouth daily as needed (allergies (watery eyes - runny nose)).  Marland Kitchen gabapentin (NEURONTIN) 300 MG capsule Take 1 capsule (300 mg total) by mouth at bedtime. (Patient taking differently: Take 300 mg by mouth 2 (two) times daily as needed (nerve pain). )  . ibuprofen (ADVIL) 200 MG tablet Take 400-600 mg by mouth  every 6 (six) hours as needed for headache.  . lisinopril (PRINIVIL,ZESTRIL) 40 MG tablet Take 1 tablet (40 mg total) by mouth daily.  . Menthol-Methyl Salicylate (MUSCLE RUB EX) Apply 1 application topically 2 (two) times daily as needed (pain).  . metFORMIN (GLUCOPHAGE) 1000 MG tablet Take 1 tablet (1,000 mg total) by mouth 2 (two) times daily with a meal.  . sodium chloride (OCEAN) 0.65 % SOLN nasal spray Place 1 spray into both nostrils as needed for congestion. (Patient taking differently: Place 1 spray into both nostrils daily as needed for congestion. )     Allergies: Allergies as of 05/22/2018  . (No Known Allergies)   Past Medical History:  Diagnosis Date  . Anemia    when in late teens  . Anxiety   . Breast abscess   . Chlamydia   . Depression    PTSD also   . Diabetes mellitus without complication (HCC)   . Foot pain, bilateral    "burning"  . Gonorrhea   . High cholesterol   . Hypercholesteremia   . Hypertension   . Recurrent upper respiratory infection (URI)    URI/SOB-treated with antibiotics-cleared up now  . Seizures (HCC)   . Substance abuse (HCC)    alcohol  . Tuberculosis    was treated    Family History:  Family History  Problem Relation Age of Onset  . Breast cancer Mother   . Breast cancer Sister   . Breast cancer Maternal Aunt      Social History: She lives at home alone in [place]. She is unable to work because of her chronic hip pain. She drinks etoh-drinks beer 6 pack per day. She smokes tobacco-1pack every 3-4 days for the past 30 yrs. No other drugs currently- smoked crack 16 yrs ago.  Review of Systems: A complete ROS was negative except as per HPI.   Physical Exam: Blood pressure 111/77, pulse 72, temperature (!) 96.5 F (35.8 C), temperature source Oral, resp. rate 15, height 5\' 3"  (1.6 m), weight 74.4 kg, last menstrual period 01/22/2013, SpO2 98 %. General: Lying in bed in no acute distress HEENT: PERRL, EOMI, cephalic, atraumatic Cardiovascular: Normal rate, regular rhythm Respiratory: Clear to auscultation bilaterally, normal work of breathing Abdominal: Nontender palpation, soft, no masses appreciated MSK: No lower extremity edema, erythema, or warmth noted bilaterally Skin: Warm and dry Psych: Depressed mood, teary throughout our conversation, normal behavior  EKG: personally reviewed my interpretation is prolonged PR interval, inverted t-waves in V1-V3.   CXR: personally reviewed my interpretation is normal heart size, clear lung fields, no signs of infiltrate, pleural effusion, or pneumothorax. Normal bone structures.  Head CT:  IMPRESSION: Mild small vessel chronic ischemic changes of deep cerebral white matter. No acute intracranial abnormalities.  CT Angio Chest: IMPRESSION: 1. No  pulmonary embolism. No acute pulmonary disease. 2. Chronic diffuse bronchial wall thickening and mild cylindrical left lower lobe bronchiectasis. 3. Solitary 5 mm right lower lobe pulmonary nodule, stable since 12/22/2017 chest CT, probably benign. No follow-up needed if patient is low-risk. Non-contrast chest CT can be considered in 12 months if patient is high-risk. This recommendation follows the consensus statement: Guidelines for Management of Incidental Pulmonary Nodules Detected on CT Images:From the Fleischner Society 2017; published online before print (10.1148/radiol.0355974163).  Assessment & Plan by Problem: Active Problems:   Vasovagal syncope  Ms. Jennison is a 53 year old female who presents after a syncopal episode. Differential includes vasovagal syncope, orthostatic syncope, and an underlying arrhythmia. Less likely  to be due a seizure given characteristic of syncope (no tongue biting, bladder/bowel incontinence, abnormal movements).   Syncope: - Orthostatics - Continue LR at 100 mL/hr x 10 hrs - Continuous cardiac monitoring  Alcohol use disorder: - CIWA with Ativan - Thiamine  Hypocalcemia: Differential includes primary hyperparathyroidism, vitamin D deficiency, and hypomagnesemia. S/p 1g calcium gluconate. - Parathyroid hormone - ionized calcium - Check phos and magnesium - Consider ordering a Vitamin D level pending other lab results  Alcohol dependence: - CIWA with atival  Major depressive disorder: - Will continue speaking to her about adding an anti-depressant and referring to behavioral health.   Hypertension: Holding home lisinopril and carvedilol in the setting of hypotension.   Normocytic anemia: Hb 9.1. Last Hb from 12/2017 12.1. Unclear etiology. Maybe an early macrocytic anemia in the setting of malnutrition and B12/folate deficiency.  -  Folic acid 1 mg QD  Hypokalemia: Replete K PRN.  FEN/GI: Carb modified, LR IVF VTE ppx: Lovenox CODE  STATUS: Full  Dispo: Admit patient to Observation with expected length of stay less than 2 midnights.  Signed: Synetta ShadowPrince, Jamie M, MD 05/22/2018, 6:50 PM  Pager: [phone]

## 2018-05-22 NOTE — ED Notes (Signed)
Patients boyfriend Charleston Ropes [phone]. Per EMS was at scene.

## 2018-05-23 DIAGNOSIS — E876 Hypokalemia: Secondary | ICD-10-CM | POA: Diagnosis not present

## 2018-05-23 DIAGNOSIS — Z7289 Other problems related to lifestyle: Secondary | ICD-10-CM

## 2018-05-23 DIAGNOSIS — G4700 Insomnia, unspecified: Secondary | ICD-10-CM

## 2018-05-23 DIAGNOSIS — R55 Syncope and collapse: Secondary | ICD-10-CM | POA: Diagnosis not present

## 2018-05-23 DIAGNOSIS — D72829 Elevated white blood cell count, unspecified: Secondary | ICD-10-CM

## 2018-05-23 LAB — CBC
HCT: 36.1 % (ref 36.0–46.0)
Hemoglobin: 12.5 g/dL (ref 12.0–15.0)
MCH: 29.7 pg (ref 26.0–34.0)
MCHC: 34.6 g/dL (ref 30.0–36.0)
MCV: 85.7 fL (ref 80.0–100.0)
Platelets: 462 10*3/uL — ABNORMAL HIGH (ref 150–400)
RBC: 4.21 MIL/uL (ref 3.87–5.11)
RDW: 12.8 % (ref 11.5–15.5)
WBC: 13.8 10*3/uL — ABNORMAL HIGH (ref 4.0–10.5)
nRBC: 0 % (ref 0.0–0.2)

## 2018-05-23 LAB — GLUCOSE, CAPILLARY: Glucose-Capillary: 91 mg/dL (ref 70–99)

## 2018-05-23 LAB — PARATHYROID HORMONE, INTACT (NO CA): PTH: 32 pg/mL (ref 15–65)

## 2018-05-23 LAB — CALCIUM, IONIZED: Calcium, Ionized, Serum: 4.2 mg/dL — ABNORMAL LOW (ref 4.5–5.6)

## 2018-05-23 LAB — MAGNESIUM: Magnesium: 1.6 mg/dL — ABNORMAL LOW (ref 1.7–2.4)

## 2018-05-23 LAB — HEMOGLOBIN A1C
Hgb A1c MFr Bld: 6.1 % — ABNORMAL HIGH (ref 4.8–5.6)
Mean Plasma Glucose: 128.37 mg/dL

## 2018-05-23 LAB — PHOSPHORUS: Phosphorus: 2.9 mg/dL (ref 2.5–4.6)

## 2018-05-23 MED ORDER — SERTRALINE HCL 50 MG PO TABS: 50.0000 mg | ORAL_TABLET | Freq: Every day | ORAL | 1 refills | Status: DC

## 2018-05-23 MED ORDER — RAMELTEON 8 MG PO TABS: 8.0000 mg | ORAL_TABLET | Freq: Every day | ORAL | 1 refills | Status: DC

## 2018-05-23 MED ORDER — THIAMINE HCL 100 MG PO TABS: 100.0000 mg | ORAL_TABLET | Freq: Every day | ORAL | 0 refills | Status: DC

## 2018-05-23 MED ORDER — SERTRALINE HCL 50 MG PO TABS
50.0000 mg | ORAL_TABLET | Freq: Every day | ORAL | Status: DC
  Administered 2018-05-23: 50 mg via ORAL
  Filled 2018-05-23: qty 1

## 2018-05-23 MED ORDER — RAMELTEON 8 MG PO TABS
8.0000 mg | ORAL_TABLET | Freq: Every day | ORAL | Status: DC
  Filled 2018-05-23: qty 1

## 2018-05-23 NOTE — Discharge Summary (Signed)
Name: [name] MRN: [id number] DOB: 1965-12-18 53 y.o. PCP: Bridget Hartshorn, DO  Date of Admission: 05/22/2018  3:30 PM Date of Discharge: 05/23/2018 Attending Physician: Inez Catalina, MD  Discharge Diagnosis: 1. Active Problems:   Vasovagal syncope   Discharge Medications: Allergies as of 05/23/2018   No Known Allergies     Medication List    STOP taking these medications   acetaminophen 325 MG tablet Commonly known as:  TYLENOL   cetirizine 10 MG tablet Commonly known as:  ZyrTEC Allergy   hydrOXYzine 25 MG tablet Commonly known as:  ATARAX/VISTARIL   sodium chloride 0.65 % Soln nasal spray Commonly known as:  OCEAN     TAKE these medications   atorvastatin 40 MG tablet Commonly known as:  LIPITOR Take 1 tablet (40 mg total) by mouth daily for 30 days. IM Program.   carvedilol 6.25 MG tablet Commonly known as:  COREG Take 1 tablet (6.25 mg total) by mouth 2 (two) times daily. What changed:  when to take this   CORICIDIN HBP PO Take 1 tablet by mouth daily as needed (allergies (watery eyes - runny nose)).   gabapentin 300 MG capsule Commonly known as:  NEURONTIN Take 1 capsule (300 mg total) by mouth at bedtime. What changed:    when to take this  reasons to take this   ibuprofen 200 MG tablet Commonly known as:  ADVIL Take 400-600 mg by mouth every 6 (six) hours as needed for headache.   lisinopril 40 MG tablet Commonly known as:  ZESTRIL Take 1 tablet (40 mg total) by mouth daily.   metFORMIN 1000 MG tablet Commonly known as:  GLUCOPHAGE Take 1 tablet (1,000 mg total) by mouth 2 (two) times daily with a meal.   MUSCLE RUB EX Apply 1 application topically 2 (two) times daily as needed (pain).   ramelteon 8 MG tablet Commonly known as:  ROZEREM Take 1 tablet (8 mg total) by mouth at bedtime.   sertraline 50 MG tablet Commonly known as:  ZOLOFT Take 1 tablet (50 mg total) by mouth daily. Start taking on:  May 24, 2018    thiamine 100 MG tablet Take 1 tablet (100 mg total) by mouth daily. Start taking on:  May 24, 2018       Disposition and follow-up:   [name] was discharged from [facility] in Stable condition.  At the hospital follow up visit please address:  1.  Patient presented with vasovagal syncope in setting of poor PO intake. Please recheck BP and reevaluate for any symptoms.  2. Patient started on Zoloft 50 mg QD for depression and prolonged grief, and Ramelteon for insomnia. Please assess the response.  3. She has alcohol use disorder, please support her for stopping alcohol  2.  Labs / imaging needed at time of follow-up: CBC, CMP (Ca)  3.  Pending labs/ test needing follow-up: Ionized Ca  Follow-up Appointments: Follow-up Information    [place], Carley D, DO. Schedule an appointment as soon as possible for a visit in 1 week(s).   Specialty:  Internal Medicine Contact information: [address] [place] [address] [phone]           Hospital Course by problem list: 79.  53 year old female with past medical history of HTN, DM 2, HLD, seizures, alcohol use disorder who was brought the emergency department by EMS after a syncopal episode when she was waiting in line to pay her bills.  On EMS arrival her blood pressure was low at 50/32.  She received bolus normal saline and started on epinephrine drip.  She does not have any evidence of seizure-like activity. She had positive orthostatic, hypokalemia 3 on arrival.no arrhythmia on telemetry overnight.  She reports increased alcohol use recently after her husband passed away.  She had poor p.o. intake. her syncopal episodes seemed to be vasovagal due to poor p.o. intake and also metabolic derangement.  She treated with fluid resuscitation.  Electrolyte repleted as needed.  She remained stable and asymptomatic and discharged home to follow-up with primary care.  Discharge Vitals:   BP (!) 150/83    Pulse 68   Temp 98.1 F (36.7 C) (Oral)   Resp 18   Ht 5\' 3"  (1.6 m)   Wt 73.5 kg   LMP 01/22/2013   SpO2 98%   BMI 28.70 kg/m    Alcohol use disorder, hypomagnesemia:  Gave Folic acid and Thiamin in this admission. And put on CIWA with Ativan PRN protocol. She did not have any withdrawal symptoms on this admission. She had hypokalemia and hypo Mg at 5.6. Electrolytes repleted. (She also had Ca 1t 5.6 that seemed to be a lab error as repeated lab next day showed ca 9.2.). She advised to cut back alcohol and f/u with PCP.  Hypertension: We held home lisinopril and carvedilol during this admission in setting of hypotension.  Her blood pressure medications will be resumed after discharge.  Depression, prolonged grief: Patient has depressed mood and affect. She reports being sad and depressed since her husband passed a way. She is interested in starting anti depression medications. Started Zoloft 50 mg D on this admission and ramelteon 8 mg PRN at bedtime for insomnia.   Leukocytosis: She had leukocytosis at 13.8 on second day of admission. No evidence of infection. Seems to be stress related.  Pertinent Labs, Studies, and Procedures:  CMP Latest Ref Rng & Units 05/23/2018 05/22/2018 12/25/2017  Glucose 70 - 99 mg/dL 93 76 76  BUN 6 - 20 mg/dL 8 <8(V) 10  Creatinine 0.44 - 1.00 mg/dL 2.91 9.16 6.06  Sodium 135 - 145 mmol/L 134(L) 139 132(L)  Potassium 3.5 - 5.1 mmol/L 4.6 3.0(L) 5.1  Chloride 98 - 111 mmol/L 103 116(H) 94(L)  CO2 22 - 32 mmol/L 21(L) 14(L) 20  Calcium 8.9 - 10.3 mg/dL 9.2 0.0(KH) 99.7  Total Protein 6.5 - 8.1 g/dL - 4.2(L) -  Total Bilirubin 0.3 - 1.2 mg/dL - 0.3 -  Alkaline Phos 38 - 126 U/L - 58 -  AST 15 - 41 U/L - 12(L) -  ALT 0 - 44 U/L - 14 -   CBC CBC Latest Ref Rng & Units 05/23/2018 05/22/2018 12/23/2017  WBC 4.0 - 10.5 K/uL 13.8(H) 6.7 6.1  Hemoglobin 12.0 - 15.0 g/dL 74.1 4.2(L) 95.3  Hematocrit 36.0 - 46.0 % 36.1 27.0(L) 38.0  Platelets 150 - 400 K/uL  462(H) 322 496(H)     Discharge Instructions: Discharge Instructions    Call MD for:  persistant dizziness or light-headedness   Complete by:  As directed    Call MD for:  persistant nausea and vomiting   Complete by:  As directed    Diet - low sodium heart healthy   Complete by:  As directed    Discharge instructions   Complete by:  As directed    Thank you for allowing [place] taking care of you at [facility].  We are glad that you  feel better today.  Please follow-up with your primary care in a week to take your blood pressure, and do some blood test and support you for cutting down alcohol. Try to cut back alcohol and drink enough water.  I also prescribe vitamin for you and well as Zoloft for your depression to be taken every day in the morning and Ramelteon as needed at night you had difficulty going to sleep.  As we discussed, will take few weeks for your depression medicine to work.please call us or your primary care if you did not tolerate the medication. Please call us at [phone] if you have any questions or concern.  Take care   Increase activity slowly   Complete by:  As directed       Signed: Chevis PrettyMasoudi, Lorris Carducci, MD 05/23/2018, 11:17 AM   Pager: [phone]

## 2018-05-23 NOTE — Progress Notes (Signed)
[name] was admitted to 5w33 from the ED via wheelchair.  The patient ambulated from wheelchair to bed without incident.  The patient is alert and oriented x4.  Bed is in the lowest position.  Call bell and telephone are within reach.  Explained to the patient how to use phone and call bell, patient indicated understanding.  Admission booklet given.    The patient noted that she was missing a bra and diabetic shoes.  This nurse called the ED nurse, who stated she had not seen any of the patient's belongings.  Patient made aware.    The patient denies any further needs at this time.  Will continue to monitor.

## 2018-05-23 NOTE — Discharge Instructions (Signed)
Thank you for allowing [place] taking care of you at [facility].  We are glad that you feel better today.  Please follow-up with your primary care in a week to take your blood pressure, and do some blood test and support you for cutting down alcohol. Try to cut back alcohol and drink enough water.  I also prescribe:  Thiamin tablet (vitamin B1)  Zoloft for your depression to be taken every day in the morning. (Try no to drink alcohol as it may interfere with that) and Ramelteon as needed at night you had difficulty going to sleep.  As we discussed, will take few weeks for your depression medicine to work.please call us or your primary care if you did not tolerate the medication. Please call us at [phone] if you have any questions or concern.     Sertraline tablets What is this medicine? SERTRALINE (SER tra leen) is used to treat depression. It may also be used to treat obsessive compulsive disorder, panic disorder, post-trauma stress, premenstrual dysphoric disorder (PMDD) or social anxiety. This medicine may be used for other purposes; ask your health care provider or pharmacist if you have questions. COMMON BRAND NAME(S): Zoloft What should I tell my health care provider before I take this medicine? They need to know if you have any of these conditions: -bleeding disorders -bipolar disorder or a family history of bipolar disorder -glaucoma -heart disease -high blood pressure -history of irregular heartbeat -history of low levels of calcium, magnesium, or potassium in the blood -if you often drink alcohol -liver disease -receiving electroconvulsive therapy -seizures -suicidal thoughts, plans, or attempt; a previous suicide attempt by you or a family member -take medicines that treat or prevent blood clots -thyroid disease -an unusual or allergic reaction to sertraline, other medicines, foods, dyes, or preservatives -pregnant or trying to get pregnant -breast-feeding How  should I use this medicine? Take this medicine by mouth with a glass of water. Follow the directions on the prescription label. You can take it with or without food. Take your medicine at regular intervals. Do not take your medicine more often than directed. Do not stop taking this medicine suddenly except upon the advice of your doctor. Stopping this medicine too quickly may cause serious side effects or your condition may worsen. A special MedGuide will be given to you by the pharmacist with each prescription and refill. Be sure to read this information carefully each time. Talk to your pediatrician regarding the use of this medicine in children. While this drug may be prescribed for children as young as 7 years for selected conditions, precautions do apply. Overdosage: If you think you have taken too much of this medicine contact a poison control center or emergency room at once. NOTE: This medicine is only for you. Do not share this medicine with others. What if I miss a dose? If you miss a dose, take it as soon as you can. If it is almost time for your next dose, take only that dose. Do not take double or extra doses. What may interact with this medicine? Do not take this medicine with any of the following medications: -cisapride -dofetilide -dronedarone -linezolid -MAOIs like Carbex, Eldepryl, Marplan, Nardil, and Parnate -methylene blue (injected into a vein) -pimozide -thioridazine This medicine may also interact with the following medications: -alcohol -amphetamines -aspirin and aspirin-like medicines -certain medicines for depression, anxiety, or psychotic disturbances -certain medicines for fungal infections like ketoconazole, fluconazole, posaconazole, and itraconazole -certain medicines for irregular heart  beat like flecainide, quinidine, propafenone -certain medicines for migraine headaches like almotriptan, eletriptan, frovatriptan, naratriptan, rizatriptan, sumatriptan,  zolmitriptan -certain medicines for sleep -certain medicines for seizures like carbamazepine, valproic acid, phenytoin -certain medicines that treat or prevent blood clots like warfarin, enoxaparin, dalteparin -cimetidine -digoxin -diuretics -fentanyl -isoniazid -lithium -NSAIDs, medicines for pain and inflammation, like ibuprofen or naproxen -other medicines that prolong the QT interval (cause an abnormal heart rhythm) -rasagiline -safinamide -supplements like St. John's wort, kava kava, valerian -tolbutamide -tramadol -tryptophan This list may not describe all possible interactions. Give your health care provider a list of all the medicines, herbs, non-prescription drugs, or dietary supplements you use. Also tell them if you smoke, drink alcohol, or use illegal drugs. Some items may interact with your medicine. What should I watch for while using this medicine? Tell your doctor if your symptoms do not get better or if they get worse. Visit your doctor or health care professional for regular checks on your progress. Because it may take several weeks to see the full effects of this medicine, it is important to continue your treatment as prescribed by your doctor. Patients and their families should watch out for new or worsening thoughts of suicide or depression. Also watch out for sudden changes in feelings such as feeling anxious, agitated, panicky, irritable, hostile, aggressive, impulsive, severely restless, overly excited and hyperactive, or not being able to sleep. If this happens, especially at the beginning of treatment or after a change in dose, call your health care professional. Bonita Quin may get drowsy or dizzy. Do not drive, use machinery, or do anything that needs mental alertness until you know how this medicine affects you. Do not stand or sit up quickly, especially if you are an older patient. This reduces the risk of dizzy or fainting spells. Alcohol may interfere with the effect of  this medicine. Avoid alcoholic drinks. Your mouth may get dry. Chewing sugarless gum or sucking hard candy, and drinking plenty of water may help. Contact your doctor if the problem does not go away or is severe. What side effects may I notice from receiving this medicine? Side effects that you should report to your doctor or health care professional as soon as possible: -allergic reactions like skin rash, itching or hives, swelling of the face, lips, or tongue -anxious -black, tarry stools -changes in vision -confusion -elevated mood, decreased need for sleep, racing thoughts, impulsive behavior -eye pain -fast, irregular heartbeat -feeling faint or lightheaded, falls -feeling agitated, angry, or irritable -hallucination, loss of contact with reality -loss of balance or coordination -loss of memory -painful or prolonged erections -restlessness, pacing, inability to keep still -seizures -stiff muscles -suicidal thoughts or other mood changes -trouble sleeping -unusual bleeding or bruising -unusually weak or tired -vomiting Side effects that usually do not require medical attention (report to your doctor or health care professional if they continue or are bothersome): -change in appetite or weight -change in sex drive or performance -diarrhea -increased sweating -indigestion, nausea -tremors This list may not describe all possible side effects. Call your doctor for medical advice about side effects. You may report side effects to FDA at 1-800-FDA-1088. Where should I keep my medicine? Keep out of the reach of children. Store at room temperature between 15 and 30 degrees C (59 and 86 degrees F). Throw away any unused medicine after the expiration date. NOTE: This sheet is a summary. It may not cover all possible information. If you have questions about this medicine, talk to your  doctor, pharmacist, or health care provider.  2019 Elsevier/Gold Standard (2016-01-12  14:17:49)  Take care

## 2018-05-23 NOTE — Progress Notes (Signed)
   Subjective: Patient was seen and evaluated at bedside on morning rounds. No acute events overnight. She mentions that she is doing well and did not have any more dizziness, headache or presyncopal episode. Denies chest pain, and shortness of breath. No nausea or vomiting.   Objective:  Vital signs in last 24 hours: Vitals:   05/22/18 2308 05/22/18 2310 05/22/18 2314 05/23/18 0537  BP: (!) 170/90 (!) 149/131 (!) 148/73 (!) 150/83  Pulse: 78 83 82 68  Resp: 17 16 16 18   Temp: 98.1 F (36.7 C) 97.8 F (36.6 C) 98 F (36.7 C) 98.1 F (36.7 C)  TempSrc: Oral Oral Oral Oral  SpO2: 99% 98% 100% 98%  Weight:      Height:       Physical Exam:  VS reviewed, nursing notes reviewed. General: Pleasant lady sitting in the bed in no acute distress CV: RRR, nl S1S2, no murmur Pulm: CTA bilaterally, no wheeze and no crackle Abdomen: Soft and nontender. BS are present Musculoskeletal: No LEE, pulses are nl bilateraly Neurologic exam: Alert and oriented x 3 Psychiatric exam: Reports depressed mood, easily became tearful, affect is normal, judgment is normal  Assessment/Plan:  Active Problems:   Vasovagal syncope  [name] is a 53 year old female with past medical history of DMII, HTN, HLD, history of seizures, who was brought to the emergency department by EMS after a syncopal episode.  Vasovagal syncope: Seems to be orthostatic syncope after standing for a long time,  Receievd IV fluid last night and BP improved. She has been asymptomatic since coming to the hospital. Vitals are stable and tele review is unremarkable excepts for some PVCs last nights that improved. (could be in setting of electrolyte abnormality on arrival hypokalemia and improved after repletion.)  Initial Trop <0.03 initial Ca 5.2, does not seem to be accurate and less likely to triggered a seizure and syncope due to that. (No evidenceo of seizure like activity or postictal based on the Hx)  -DC home today to  follow up with PCP within a week  Hypocalcemia: Hypo Mg likely due to alcohol use: I suspect that hypocalcemia was a lab error, as Ca came up from 5.6 yesterday to 9.2 today. Or could be due to hypoMg due to alcohol use. Mg repleted and Ca is nl today.  Gave Folic acid and Thiamin in this admission. And put on CIWA with Ativan PRN protocol.  -Repeat CMP at follow up visit -F/u with PCP for assisting cutting down alcohol -Continue thiamin  Depression: Patient has depressed mood. Affect is normal. She has lost her husband recently and is in grief. Also complains of insomnia. She becomes tearful during the encounter. We discussed starting her on medicine and she is willing to do so. We start her on SSRI and Ramelteon.  -Starting Zoloft 50 mg QD -Ramelteon 8 mg PRN at bedtime  Dispo: DC home today  Chevis Pretty, MD 05/23/2018, 9:19 AM Pager: [phone]

## 2018-05-24 LAB — BASIC METABOLIC PANEL
Anion gap: 10 (ref 5–15)
BUN: 8 mg/dL (ref 6–20)
CO2: 21 mmol/L — ABNORMAL LOW (ref 22–32)
Calcium: 9.2 mg/dL (ref 8.9–10.3)
Chloride: 103 mmol/L (ref 98–111)
Creatinine, Ser: 0.53 mg/dL (ref 0.44–1.00)
GFR calc Af Amer: >60 mL/min (ref >60)
GFR calc non Af Amer: >60 mL/min (ref >60)
Glucose, Bld: 93 mg/dL (ref 70–99)
Potassium: 4.6 mmol/L (ref 3.5–5.1)
Sodium: 134 mmol/L — ABNORMAL LOW (ref 135–145)

## 2018-06-01 ENCOUNTER — Other Ambulatory Visit: Payer: Self-pay

## 2018-06-01 ENCOUNTER — Other Ambulatory Visit: Payer: Self-pay | Admitting: Internal Medicine

## 2018-06-01 ENCOUNTER — Ambulatory Visit (INDEPENDENT_AMBULATORY_CARE_PROVIDER_SITE_OTHER): Admitting: Internal Medicine

## 2018-06-01 DIAGNOSIS — I10 Essential (primary) hypertension: Secondary | ICD-10-CM

## 2018-06-01 DIAGNOSIS — R748 Abnormal levels of other serum enzymes: Secondary | ICD-10-CM

## 2018-06-01 DIAGNOSIS — F320 Major depressive disorder, single episode, mild: Secondary | ICD-10-CM

## 2018-06-01 NOTE — Progress Notes (Signed)
   This is a telephone encounter between [name] and [name] on 06/01/2018 for hospital follow-up. The visit was conducted with the patient located at home and [name] at [facility]. The patient's identity was confirmed using their DOB and current address. The patient has consented to being evaluated through a telephone encounter and understands the associated risks (an examination cannot be done and the patient may need to come in for an appointment) / benefits (allows the patient to remain at home, decreasing exposure to coronavirus). I personally spent 15 minutes on medical discussion.   CC: Hospital follow-up.  HPI:  [name] is a 53 y.o. with past medical history as listed below was given a call due to her recent admission at [facility].  Patient was admitted at [facility] from 5/1-5/2 after having a syncopal episode, she was found to be hypotensive with multiple electrolyte derangements.  Apparently patient was drinking alcohol more than usual after her husband's death.  She was resuscitated with fluids and electrolytes were repleted.  Since her discharge she is doing well.  She denies any more dizziness.  She occasionally checks her blood pressure at home which remains within normal limit.  Her last check was yesterday and it was 112/87.  She is compliant with her carvedilol and lisinopril.  Per patient she is avoiding alcohol and keeping herself well-hydrated now.  She was also started on Zoloft and compliant with it.  Per patient she feels little more energetic.  Please see assessment and plan.   Past Medical History:  Diagnosis Date  . Anemia    when in late teens  . Anxiety   . Breast abscess   . Chlamydia   . Depression    PTSD also  . Diabetes mellitus without complication (HCC)   . Foot pain, bilateral    "burning"  . Gonorrhea   . High cholesterol   . Hypercholesteremia   . Hypertension   . Recurrent upper respiratory  infection (URI)    URI/SOB-treated with antibiotics-cleared up now  . Seizures (HCC)   . Substance abuse (HCC)    alcohol  . Tuberculosis    was treated   Review of Systems: Negative except mentioned in HPI   Assessment & Plan:   See Encounters Tab for problem based charting.  Patient discussed with Dr. Cyndie Chime

## 2018-06-01 NOTE — Assessment & Plan Note (Signed)
Per patient she has cut back on her alcohol intake.  Advised her to keep herself well-hydrated and avoid alcohol as much as possible. We will repeat CMP and magnesium levels-orders were sent for [facility].

## 2018-06-01 NOTE — Assessment & Plan Note (Signed)
Patient was recently started on Zoloft 50 mg daily, she is compliant with her medication and states that it is helping with her depression.  She feels little more energetic than before.  She will continue with Zoloft 50 mg daily and we will reassess her during next follow-up visit in clinic.

## 2018-06-01 NOTE — Assessment & Plan Note (Signed)
Her recent admission due to vasovagal syncope secondary to hypotension has been resolved. Patient is compliant with lisinopril and carvedilol and denies any dizziness.  She is keeping herself well-hydrated.  -She was advised to check her blood pressure regularly and keep a log which she can bring next time during her appointment with PCP. -She was due for her repeat CMP, magnesium and CBC-orders were sent to [facility].

## 2018-06-02 NOTE — Progress Notes (Signed)
Medicine attending: Medical history, presenting problems, physical complaints, and medications, reviewed with resident physician Dr [name] on the day of the patient telephone consultation and I concur with her evaluation and management plan. Hospital follow up, dehydration, electrolyte abnormalities, alcohol over-use resulting in a syncopal episode. Hx of depression exacerbated by recent death of spouse. Back to baseline. Stable at present.

## 2018-06-09 ENCOUNTER — Ambulatory Visit
Admission: RE | Admit: 2018-06-09 | Discharge: 2018-06-09 | Disposition: A | Discharge status: Home / Self Care | Source: Ambulatory Visit | Attending: Family Medicine | Admitting: Family Medicine

## 2018-06-09 ENCOUNTER — Other Ambulatory Visit: Payer: Self-pay | Admitting: Internal Medicine

## 2018-06-09 ENCOUNTER — Other Ambulatory Visit: Payer: Self-pay

## 2018-06-09 DIAGNOSIS — E1129 Type 2 diabetes mellitus with other diabetic kidney complication: Secondary | ICD-10-CM

## 2018-06-09 DIAGNOSIS — Z1231 Encounter for screening mammogram for malignant neoplasm of breast: Secondary | ICD-10-CM

## 2018-06-11 ENCOUNTER — Other Ambulatory Visit: Payer: Self-pay | Admitting: Internal Medicine

## 2018-06-11 DIAGNOSIS — E1129 Type 2 diabetes mellitus with other diabetic kidney complication: Secondary | ICD-10-CM

## 2018-06-11 DIAGNOSIS — R809 Proteinuria, unspecified: Secondary | ICD-10-CM

## 2018-06-22 ENCOUNTER — Encounter: Admitting: Internal Medicine

## 2018-06-29 ENCOUNTER — Other Ambulatory Visit: Payer: Self-pay | Admitting: *Deleted

## 2018-06-29 DIAGNOSIS — E1129 Type 2 diabetes mellitus with other diabetic kidney complication: Secondary | ICD-10-CM

## 2018-06-29 MED ORDER — METFORMIN HCL 1000 MG PO TABS: 1000.0000 mg | ORAL_TABLET | Freq: Two times a day (BID) | ORAL | 3 refills | Status: DC

## 2018-06-29 NOTE — Telephone Encounter (Signed)
July [facility] appt Dr Koleen Distance DMF/U

## 2018-07-01 ENCOUNTER — Other Ambulatory Visit: Payer: Self-pay | Admitting: *Deleted

## 2018-07-01 DIAGNOSIS — I10 Essential (primary) hypertension: Secondary | ICD-10-CM

## 2018-07-01 MED ORDER — CARVEDILOL 6.25 MG PO TABS: 6.2500 mg | ORAL_TABLET | Freq: Two times a day (BID) | ORAL | 1 refills | Status: DC

## 2018-07-02 ENCOUNTER — Other Ambulatory Visit: Payer: Self-pay | Admitting: Internal Medicine

## 2018-07-02 DIAGNOSIS — E1129 Type 2 diabetes mellitus with other diabetic kidney complication: Secondary | ICD-10-CM

## 2018-07-02 DIAGNOSIS — R809 Proteinuria, unspecified: Secondary | ICD-10-CM

## 2018-08-03 ENCOUNTER — Other Ambulatory Visit: Payer: Self-pay

## 2018-08-03 ENCOUNTER — Ambulatory Visit (INDEPENDENT_AMBULATORY_CARE_PROVIDER_SITE_OTHER): Admitting: Internal Medicine

## 2018-08-03 ENCOUNTER — Encounter: Payer: Self-pay | Admitting: Internal Medicine

## 2018-08-03 VITALS — BP 127/78 | HR 75 | Temp 98.1°F | Resp 75 | Ht 63.0 in | Wt 164.3 lb

## 2018-08-03 DIAGNOSIS — Z7984 Long term (current) use of oral hypoglycemic drugs: Secondary | ICD-10-CM

## 2018-08-03 DIAGNOSIS — Z79899 Other long term (current) drug therapy: Secondary | ICD-10-CM

## 2018-08-03 DIAGNOSIS — R809 Proteinuria, unspecified: Secondary | ICD-10-CM

## 2018-08-03 DIAGNOSIS — I10 Essential (primary) hypertension: Secondary | ICD-10-CM | POA: Diagnosis not present

## 2018-08-03 DIAGNOSIS — E1129 Type 2 diabetes mellitus with other diabetic kidney complication: Secondary | ICD-10-CM | POA: Diagnosis not present

## 2018-08-03 DIAGNOSIS — Z0000 Encounter for general adult medical examination without abnormal findings: Secondary | ICD-10-CM

## 2018-08-03 LAB — POCT GLYCOSYLATED HEMOGLOBIN (HGB A1C): Hemoglobin A1C: 5.4 % (ref 4.0–5.6)

## 2018-08-03 LAB — GLUCOSE, CAPILLARY: Glucose-Capillary: 97 mg/dL (ref 70–99)

## 2018-08-03 NOTE — Progress Notes (Signed)
   CC: HTN, DM  HPI:  [name] is a 53 y.o. female with PMHx listed below who presents for follow-up on HTN, DM. Please see problem based charting for updates on her chronic medical conditions.   Past Medical History:  Diagnosis Date  . Anemia    when in late teens  . Anxiety   . Breast abscess   . Chlamydia   . Depression    PTSD also  . Diabetes mellitus without complication ([place])   . Foot pain, bilateral    "burning"  . Gonorrhea   . High cholesterol   . Hypercholesteremia   . Hypertension   . Recurrent upper respiratory infection (URI)    URI/SOB-treated with antibiotics-cleared up now  . Seizures ([place])   . Substance abuse ([place])    alcohol  . Tuberculosis    was treated   Review of Systems:  Review of Systems  Constitutional: Negative for chills, fever and weight loss.  Eyes: Negative for double vision.  Respiratory: Negative for shortness of breath.   Cardiovascular: Negative for chest pain, palpitations and leg swelling.  Gastrointestinal: Negative for abdominal pain, nausea and vomiting.  Genitourinary: Negative for dysuria.  Musculoskeletal: Negative for falls.  Neurological: Negative for dizziness and focal weakness.     Physical Exam:  Vitals:   08/03/18 1413  BP: 127/78  Pulse: 75  Resp: (!) 75  Temp: 98.1 F (36.7 C)  TempSrc: Oral  SpO2: 100%  Weight: 164 lb 4.8 oz (74.5 kg)  Height: 5\' 3"  (1.6 m)   General: well-appearing, pleasant female, in NAD HEENT: conjunctiva normal Neck: supple; no thyromegaly, no cervical LAD CV: RRR; no m/r/g Pulm: normal work of breathing; lungs CTAB Abd: BS+; abdomen is soft, non-tender, non-distended Ext: no edema Psych: in good spirits. Normal mood and affect   Assessment & Plan:   See Encounters Tab for problem based charting.  Patient discussed with Dr. Dareen Piano

## 2018-08-03 NOTE — Patient Instructions (Signed)
[name], It was good seeing you today.   I'm glad you are doing well. We will check an A1C today. It looks like your blood sugars have been very well controlled. Please let us know if you notice an increase in low blood sugar readings.   We will not make any changes to your medications today.   I'll see you in 3 months!  Take care, Dr. Koleen Distance

## 2018-08-04 ENCOUNTER — Encounter: Payer: Self-pay | Admitting: Internal Medicine

## 2018-08-04 NOTE — Assessment & Plan Note (Addendum)
Patient is doing well with eating a healthier diet, mostly fresh fruits, vegetables and baked meats. Compliant with Metformin 1000 mg BID. Glucometer reviewed with average between 100-120 which is consistent with A1C of 5.4 today. Will continue current therapy.

## 2018-08-04 NOTE — Assessment & Plan Note (Addendum)
BP Readings from Last 3 Encounters:  08/03/18 127/78  05/23/18 (!) 150/83  04/11/18 (!) 166/94   Patient is normotensive today. No further vasovagal events. Doing well on Coreg and Lisinopril. Continue current regimen.

## 2018-08-04 NOTE — Assessment & Plan Note (Signed)
Patient currently in process of getting insurance. Will pursue referral for colonoscopy once she has completed process.

## 2018-08-05 NOTE — Progress Notes (Signed)
Internal Medicine Clinic Attending  Case discussed with Dr. Bloomfield at the time of the visit.  We reviewed the resident's history and exam and pertinent patient test results.  I agree with the assessment, diagnosis, and plan of care documented in the resident's note.  

## 2018-09-08 ENCOUNTER — Other Ambulatory Visit: Payer: Self-pay | Admitting: Internal Medicine

## 2018-09-08 DIAGNOSIS — E1129 Type 2 diabetes mellitus with other diabetic kidney complication: Secondary | ICD-10-CM

## 2018-09-09 ENCOUNTER — Telehealth: Payer: Self-pay

## 2018-09-09 DIAGNOSIS — E1129 Type 2 diabetes mellitus with other diabetic kidney complication: Secondary | ICD-10-CM

## 2018-09-09 MED ORDER — GABAPENTIN 300 MG PO CAPS: 300.0000 mg | ORAL_CAPSULE | Freq: Two times a day (BID) | ORAL | 2 refills | Status: DC

## 2018-09-09 NOTE — Telephone Encounter (Signed)
gabapentin (NEURONTIN) 300 MG capsule   REFILL REQUEST @  [facility] ([place] [place] - [address] [phone] (Phone) [phone] (Fax)   Pt states she went to the pharmacy to get the med refill, the pharmacy told her she cannot get a refill not until Sept. Please call pt back.

## 2018-09-09 NOTE — Telephone Encounter (Signed)
New Rx sent for twice daily. Thanks!

## 2018-09-09 NOTE — Telephone Encounter (Signed)
Dr bloomfield, pt has been taking more gabapentin that prescribed, she states you told her at last appt she could take 2 a day when needed, please advise, if this is accurate please send a new script to pharm stating 2 daily. She insists that it was discussed at 7/13 appt

## 2018-10-26 ENCOUNTER — Ambulatory Visit

## 2018-10-26 ENCOUNTER — Encounter: Payer: Self-pay | Admitting: Internal Medicine

## 2018-11-13 ENCOUNTER — Encounter (HOSPITAL_COMMUNITY): Payer: Self-pay

## 2018-11-13 ENCOUNTER — Other Ambulatory Visit: Payer: Self-pay

## 2018-11-13 ENCOUNTER — Observation Stay (HOSPITAL_COMMUNITY)
Admission: EM | Admit: 2018-11-13 | Discharge: 2018-11-14 | Disposition: A | Discharge status: Home / Self Care | Attending: Internal Medicine | Admitting: Internal Medicine

## 2018-11-13 DIAGNOSIS — D696 Thrombocytopenia, unspecified: Secondary | ICD-10-CM | POA: Insufficient documentation

## 2018-11-13 DIAGNOSIS — F1010 Alcohol abuse, uncomplicated: Secondary | ICD-10-CM | POA: Diagnosis present

## 2018-11-13 DIAGNOSIS — F1994 Other psychoactive substance use, unspecified with psychoactive substance-induced mood disorder: Secondary | ICD-10-CM | POA: Diagnosis not present

## 2018-11-13 DIAGNOSIS — I959 Hypotension, unspecified: Principal | ICD-10-CM

## 2018-11-13 DIAGNOSIS — F1020 Alcohol dependence, uncomplicated: Secondary | ICD-10-CM | POA: Diagnosis not present

## 2018-11-13 DIAGNOSIS — Z8611 Personal history of tuberculosis: Secondary | ICD-10-CM | POA: Diagnosis not present

## 2018-11-13 DIAGNOSIS — I10 Essential (primary) hypertension: Secondary | ICD-10-CM | POA: Insufficient documentation

## 2018-11-13 DIAGNOSIS — Z20828 Contact with and (suspected) exposure to other viral communicable diseases: Secondary | ICD-10-CM | POA: Insufficient documentation

## 2018-11-13 DIAGNOSIS — Z23 Encounter for immunization: Secondary | ICD-10-CM | POA: Insufficient documentation

## 2018-11-13 DIAGNOSIS — E860 Dehydration: Secondary | ICD-10-CM | POA: Insufficient documentation

## 2018-11-13 DIAGNOSIS — E785 Hyperlipidemia, unspecified: Secondary | ICD-10-CM | POA: Diagnosis not present

## 2018-11-13 DIAGNOSIS — F329 Major depressive disorder, single episode, unspecified: Secondary | ICD-10-CM | POA: Insufficient documentation

## 2018-11-13 DIAGNOSIS — Z7984 Long term (current) use of oral hypoglycemic drugs: Secondary | ICD-10-CM | POA: Diagnosis not present

## 2018-11-13 DIAGNOSIS — F4310 Post-traumatic stress disorder, unspecified: Secondary | ICD-10-CM | POA: Diagnosis not present

## 2018-11-13 DIAGNOSIS — D649 Anemia, unspecified: Secondary | ICD-10-CM

## 2018-11-13 DIAGNOSIS — Z79899 Other long term (current) drug therapy: Secondary | ICD-10-CM | POA: Insufficient documentation

## 2018-11-13 DIAGNOSIS — E871 Hypo-osmolality and hyponatremia: Secondary | ICD-10-CM | POA: Diagnosis present

## 2018-11-13 DIAGNOSIS — I951 Orthostatic hypotension: Secondary | ICD-10-CM

## 2018-11-13 DIAGNOSIS — E1169 Type 2 diabetes mellitus with other specified complication: Secondary | ICD-10-CM

## 2018-11-13 DIAGNOSIS — E7800 Pure hypercholesterolemia, unspecified: Secondary | ICD-10-CM | POA: Insufficient documentation

## 2018-11-13 DIAGNOSIS — E1140 Type 2 diabetes mellitus with diabetic neuropathy, unspecified: Secondary | ICD-10-CM | POA: Diagnosis not present

## 2018-11-13 DIAGNOSIS — F17210 Nicotine dependence, cigarettes, uncomplicated: Secondary | ICD-10-CM | POA: Diagnosis not present

## 2018-11-13 DIAGNOSIS — E1129 Type 2 diabetes mellitus with other diabetic kidney complication: Secondary | ICD-10-CM

## 2018-11-13 LAB — OSMOLALITY: Osmolality: 279 mOsm/kg (ref 275–295)

## 2018-11-13 LAB — VITAMIN B12: Vitamin B-12: 783 pg/mL (ref 180–914)

## 2018-11-13 LAB — BASIC METABOLIC PANEL
Anion gap: 11 (ref 5–15)
Anion gap: 7 (ref 5–15)
Anion gap: 7 (ref 5–15)
BUN: 6 mg/dL (ref 6–20)
BUN: 5 mg/dL — ABNORMAL LOW (ref 6–20)
BUN: 6 mg/dL (ref 6–20)
CO2: 19 mmol/L — ABNORMAL LOW (ref 22–32)
CO2: 25 mmol/L (ref 22–32)
CO2: 23 mmol/L (ref 22–32)
Calcium: 7.9 mg/dL — ABNORMAL LOW (ref 8.9–10.3)
Calcium: 8.6 mg/dL — ABNORMAL LOW (ref 8.9–10.3)
Calcium: 8.5 mg/dL — ABNORMAL LOW (ref 8.9–10.3)
Chloride: 90 mmol/L — ABNORMAL LOW (ref 98–111)
Chloride: 92 mmol/L — ABNORMAL LOW (ref 98–111)
Chloride: 94 mmol/L — ABNORMAL LOW (ref 98–111)
Creatinine, Ser: 0.56 mg/dL (ref 0.44–1.00)
Creatinine, Ser: 0.63 mg/dL (ref 0.44–1.00)
Creatinine, Ser: 0.62 mg/dL (ref 0.44–1.00)
GFR calc Af Amer: >60 mL/min (ref >60)
GFR calc Af Amer: >60 mL/min (ref >60)
GFR calc Af Amer: >60 mL/min (ref >60)
GFR calc non Af Amer: >60 mL/min (ref >60)
GFR calc non Af Amer: >60 mL/min (ref >60)
GFR calc non Af Amer: >60 mL/min (ref >60)
Glucose, Bld: 135 mg/dL — ABNORMAL HIGH (ref 70–99)
Glucose, Bld: 130 mg/dL — ABNORMAL HIGH (ref 70–99)
Glucose, Bld: 129 mg/dL — ABNORMAL HIGH (ref 70–99)
Potassium: 4.1 mmol/L (ref 3.5–5.1)
Potassium: 4.1 mmol/L (ref 3.5–5.1)
Potassium: 4 mmol/L (ref 3.5–5.1)
Sodium: 120 mmol/L — ABNORMAL LOW (ref 135–145)
Sodium: 124 mmol/L — ABNORMAL LOW (ref 135–145)
Sodium: 124 mmol/L — ABNORMAL LOW (ref 135–145)

## 2018-11-13 LAB — IRON AND TIBC
Iron: 183 ug/dL — ABNORMAL HIGH (ref 28–170)
Saturation Ratios: 86 % — ABNORMAL HIGH (ref 10.4–31.8)
TIBC: 213 ug/dL — ABNORMAL LOW (ref 250–450)
UIBC: 30 ug/dL

## 2018-11-13 LAB — RAPID URINE DRUG SCREEN, HOSP PERFORMED
Amphetamines: NOT DETECTED
Barbiturates: NOT DETECTED
Benzodiazepines: NOT DETECTED
Cocaine: NOT DETECTED
Opiates: NOT DETECTED
Tetrahydrocannabinol: NOT DETECTED

## 2018-11-13 LAB — URINALYSIS, ROUTINE W REFLEX MICROSCOPIC
Bilirubin Urine: NEGATIVE
Glucose, UA: NEGATIVE mg/dL
Hgb urine dipstick: NEGATIVE
Ketones, ur: NEGATIVE mg/dL
Leukocytes,Ua: NEGATIVE
Nitrite: NEGATIVE
Protein, ur: NEGATIVE mg/dL
Specific Gravity, Urine: 1.002 — ABNORMAL LOW (ref 1.005–1.030)
pH: 6 (ref 5.0–8.0)

## 2018-11-13 LAB — COMPREHENSIVE METABOLIC PANEL
ALT: 72 U/L — ABNORMAL HIGH (ref 0–44)
AST: 74 U/L — ABNORMAL HIGH (ref 15–41)
Albumin: 3.2 g/dL — ABNORMAL LOW (ref 3.5–5.0)
Alkaline Phosphatase: 67 U/L (ref 38–126)
Anion gap: 10 (ref 5–15)
BUN: <5 mg/dL — ABNORMAL LOW (ref 6–20)
CO2: 17 mmol/L — ABNORMAL LOW (ref 22–32)
Calcium: 7.1 mg/dL — ABNORMAL LOW (ref 8.9–10.3)
Chloride: 101 mmol/L (ref 98–111)
Creatinine, Ser: 0.42 mg/dL — ABNORMAL LOW (ref 0.44–1.00)
GFR calc Af Amer: >60 mL/min (ref >60)
GFR calc non Af Amer: >60 mL/min (ref >60)
Glucose, Bld: 81 mg/dL (ref 70–99)
Potassium: 4.7 mmol/L (ref 3.5–5.1)
Sodium: 128 mmol/L — ABNORMAL LOW (ref 135–145)
Total Bilirubin: 1.4 mg/dL — ABNORMAL HIGH (ref 0.3–1.2)
Total Protein: 5.9 g/dL — ABNORMAL LOW (ref 6.5–8.1)

## 2018-11-13 LAB — OSMOLALITY, URINE: Osmolality, Ur: 108 mOsm/kg — ABNORMAL LOW (ref 300–900)

## 2018-11-13 LAB — CBC WITH DIFFERENTIAL/PLATELET
Abs Immature Granulocytes: 0.01 10*3/uL (ref 0.00–0.07)
Basophils Absolute: 0 10*3/uL (ref 0.0–0.1)
Basophils Relative: 0 %
Eosinophils Absolute: 0.2 10*3/uL (ref 0.0–0.5)
Eosinophils Relative: 3 %
HCT: 29.3 % — ABNORMAL LOW (ref 36.0–46.0)
Hemoglobin: 10 g/dL — ABNORMAL LOW (ref 12.0–15.0)
Immature Granulocytes: 0 %
Lymphocytes Relative: 35 %
Lymphs Abs: 1.8 10*3/uL (ref 0.7–4.0)
MCH: 28.7 pg (ref 26.0–34.0)
MCHC: 34.1 g/dL (ref 30.0–36.0)
MCV: 84 fL (ref 80.0–100.0)
Monocytes Absolute: 0.4 10*3/uL (ref 0.1–1.0)
Monocytes Relative: 8 %
Neutro Abs: 2.7 10*3/uL (ref 1.7–7.7)
Neutrophils Relative %: 54 %
Platelets: 128 10*3/uL — ABNORMAL LOW (ref 150–400)
RBC: 3.49 MIL/uL — ABNORMAL LOW (ref 3.87–5.11)
RDW: 15.1 % (ref 11.5–15.5)
WBC: 5.1 10*3/uL (ref 4.0–10.5)
nRBC: 0 % (ref 0.0–0.2)

## 2018-11-13 LAB — GLUCOSE, CAPILLARY
Glucose-Capillary: 85 mg/dL (ref 70–99)
Glucose-Capillary: 107 mg/dL — ABNORMAL HIGH (ref 70–99)
Glucose-Capillary: 120 mg/dL — ABNORMAL HIGH (ref 70–99)

## 2018-11-13 LAB — HEMOGLOBIN A1C
Hgb A1c MFr Bld: 6.2 % — ABNORMAL HIGH (ref 4.8–5.6)
Mean Plasma Glucose: 131.24 mg/dL

## 2018-11-13 LAB — FOLATE: Folate: 14.2 ng/mL (ref >5.9)

## 2018-11-13 LAB — I-STAT CHEM 8, ED
BUN: 4 mg/dL — ABNORMAL LOW (ref 6–20)
Calcium, Ion: 1.05 mmol/L — ABNORMAL LOW (ref 1.15–1.40)
Chloride: 88 mmol/L — ABNORMAL LOW (ref 98–111)
Creatinine, Ser: 0.7 mg/dL (ref 0.44–1.00)
Glucose, Bld: 99 mg/dL (ref 70–99)
HCT: 32 % — ABNORMAL LOW (ref 36.0–46.0)
Hemoglobin: 10.9 g/dL — ABNORMAL LOW (ref 12.0–15.0)
Potassium: 3.8 mmol/L (ref 3.5–5.1)
Sodium: 121 mmol/L — ABNORMAL LOW (ref 135–145)
TCO2: 19 mmol/L — ABNORMAL LOW (ref 22–32)

## 2018-11-13 LAB — CBC
HCT: 32 % — ABNORMAL LOW (ref 36.0–46.0)
Hemoglobin: 10.9 g/dL — ABNORMAL LOW (ref 12.0–15.0)
MCH: 28.8 pg (ref 26.0–34.0)
MCHC: 34.1 g/dL (ref 30.0–36.0)
MCV: 84.4 fL (ref 80.0–100.0)
Platelets: 133 10*3/uL — ABNORMAL LOW (ref 150–400)
RBC: 3.79 MIL/uL — ABNORMAL LOW (ref 3.87–5.11)
RDW: 15.2 % (ref 11.5–15.5)
WBC: 7.5 10*3/uL (ref 4.0–10.5)
nRBC: 0 % (ref 0.0–0.2)

## 2018-11-13 LAB — RETICULOCYTES
Immature Retic Fract: 5.9 % (ref 2.3–15.9)
RBC.: 3.79 MIL/uL — ABNORMAL LOW (ref 3.87–5.11)
Retic Count, Absolute: 81.5 10*3/uL (ref 19.0–186.0)
Retic Ct Pct: 2.2 % (ref 0.4–3.1)

## 2018-11-13 LAB — I-STAT BETA HCG BLOOD, ED (MC, WL, AP ONLY): I-stat hCG, quantitative: <5 m[IU]/mL (ref <5)

## 2018-11-13 LAB — MAGNESIUM: Magnesium: 1.4 mg/dL — ABNORMAL LOW (ref 1.7–2.4)

## 2018-11-13 LAB — SARS CORONAVIRUS 2 (TAT 6-24 HRS): SARS Coronavirus 2: NEGATIVE

## 2018-11-13 LAB — SODIUM, URINE, RANDOM: Sodium, Ur: 14 mmol/L

## 2018-11-13 LAB — FERRITIN: Ferritin: 174 ng/mL (ref 11–307)

## 2018-11-13 LAB — ETHANOL: Alcohol, Ethyl (B): 60 mg/dL — ABNORMAL HIGH (ref <10)

## 2018-11-13 LAB — PHOSPHORUS: Phosphorus: 2.9 mg/dL (ref 2.5–4.6)

## 2018-11-13 MED ORDER — GLUCAGON HCL RDNA (DIAGNOSTIC) 1 MG IJ SOLR
2.0000 mg | Freq: Once | INTRAMUSCULAR | Status: AC
  Administered 2018-11-13: 2 mg via INTRAVENOUS
  Filled 2018-11-13: qty 2

## 2018-11-13 MED ORDER — INFLUENZA VAC SPLIT QUAD 0.5 ML IM SUSY
0.5000 mL | PREFILLED_SYRINGE | INTRAMUSCULAR | Status: AC
  Administered 2018-11-14: 0.5 mL via INTRAMUSCULAR
  Filled 2018-11-13: qty 0.5

## 2018-11-13 MED ORDER — VITAMIN B-12 1000 MCG PO TABS
1000.0000 ug | ORAL_TABLET | Freq: Every day | ORAL | Status: DC
  Administered 2018-11-13 – 2018-11-14 (×2): 1000 ug via ORAL
  Filled 2018-11-13 (×2): qty 1

## 2018-11-13 MED ORDER — THIAMINE HCL 100 MG/ML IJ SOLN: 100.0000 mg | Freq: Every day | INTRAMUSCULAR | Status: DC

## 2018-11-13 MED ORDER — LORAZEPAM 2 MG/ML IJ SOLN: 1.0000 mg | INTRAMUSCULAR | Status: DC | PRN

## 2018-11-13 MED ORDER — GLUCAGON HCL RDNA (DIAGNOSTIC) 1 MG IJ SOLR
1.0000 mg | Freq: Once | INTRAMUSCULAR | Status: AC
  Administered 2018-11-13: 1 mg via INTRAVENOUS
  Filled 2018-11-13: qty 1

## 2018-11-13 MED ORDER — SODIUM CHLORIDE 0.9 % IV SOLN
INTRAVENOUS | Status: DC
  Administered 2018-11-13 (×3): via INTRAVENOUS

## 2018-11-13 MED ORDER — ONDANSETRON HCL 4 MG/2ML IJ SOLN
4.0000 mg | Freq: Once | INTRAMUSCULAR | Status: AC
  Administered 2018-11-13: 04:00:00 4 mg via INTRAVENOUS
  Filled 2018-11-13: qty 2

## 2018-11-13 MED ORDER — ADULT MULTIVITAMIN W/MINERALS CH
1.0000 | ORAL_TABLET | Freq: Every day | ORAL | Status: DC
  Administered 2018-11-13 – 2018-11-14 (×2): 1 via ORAL
  Filled 2018-11-13 (×2): qty 1

## 2018-11-13 MED ORDER — FOLIC ACID 1 MG PO TABS
1.0000 mg | ORAL_TABLET | Freq: Every day | ORAL | Status: DC
  Administered 2018-11-13 – 2018-11-14 (×2): 1 mg via ORAL
  Filled 2018-11-13 (×2): qty 1

## 2018-11-13 MED ORDER — SODIUM CHLORIDE 0.9 % IV BOLUS
500.0000 mL | Freq: Once | INTRAVENOUS | Status: AC
  Administered 2018-11-13: 500 mL via INTRAVENOUS

## 2018-11-13 MED ORDER — INSULIN ASPART 100 UNIT/ML ~~LOC~~ SOLN
0.0000 [IU] | Freq: Every day | SUBCUTANEOUS | Status: DC
  Filled 2018-11-13: qty 0.05

## 2018-11-13 MED ORDER — CALCIUM CARBONATE 1250 (500 CA) MG PO TABS
1.0000 | ORAL_TABLET | Freq: Every day | ORAL | Status: DC
  Administered 2018-11-13 – 2018-11-14 (×2): 500 mg via ORAL
  Filled 2018-11-13 (×2): qty 1

## 2018-11-13 MED ORDER — SODIUM CHLORIDE 0.9 % IV SOLN
INTRAVENOUS | Status: DC
  Administered 2018-11-13: 05:00:00 via INTRAVENOUS

## 2018-11-13 MED ORDER — ATORVASTATIN CALCIUM 40 MG PO TABS
40.0000 mg | ORAL_TABLET | Freq: Every day | ORAL | Status: DC
  Administered 2018-11-13 – 2018-11-14 (×2): 40 mg via ORAL
  Filled 2018-11-13 (×2): qty 1

## 2018-11-13 MED ORDER — THIAMINE HCL 100 MG/ML IJ SOLN
Freq: Once | INTRAVENOUS | Status: DC
  Filled 2018-11-13: qty 1000

## 2018-11-13 MED ORDER — INSULIN ASPART 100 UNIT/ML ~~LOC~~ SOLN
0.0000 [IU] | Freq: Three times a day (TID) | SUBCUTANEOUS | Status: DC
  Filled 2018-11-13: qty 0.15

## 2018-11-13 MED ORDER — ENOXAPARIN SODIUM 40 MG/0.4ML ~~LOC~~ SOLN
40.0000 mg | SUBCUTANEOUS | Status: DC
  Administered 2018-11-13 – 2018-11-14 (×2): 40 mg via SUBCUTANEOUS
  Filled 2018-11-13 (×2): qty 0.4

## 2018-11-13 MED ORDER — VITAMIN B-1 100 MG PO TABS
100.0000 mg | ORAL_TABLET | Freq: Every day | ORAL | Status: DC
  Administered 2018-11-13 – 2018-11-14 (×2): 100 mg via ORAL
  Filled 2018-11-13 (×2): qty 1

## 2018-11-13 MED ORDER — SERTRALINE HCL 50 MG PO TABS
50.0000 mg | ORAL_TABLET | Freq: Every day | ORAL | Status: DC
  Administered 2018-11-13 – 2018-11-14 (×2): 50 mg via ORAL
  Filled 2018-11-13 (×2): qty 1

## 2018-11-13 MED ORDER — LORAZEPAM 1 MG PO TABS: 1.0000 mg | ORAL_TABLET | ORAL | Status: DC | PRN

## 2018-11-13 MED ORDER — MAGNESIUM SULFATE IN D5W 1-5 GM/100ML-% IV SOLN
1.0000 g | Freq: Once | INTRAVENOUS | Status: AC
  Administered 2018-11-13: 1 g via INTRAVENOUS
  Filled 2018-11-13: qty 100

## 2018-11-13 NOTE — H&P (Addendum)
History and Physical    [name]:[id number] DOB: 04/28/1965 DOA: 11/13/2018  PCP: Lenward ChancellorBloomfield, Carley D, DO Patient coming from: Home  Chief Complaint: Nausea vomiting diarrhea  HPI: Carrie QualiaCharlotte Cain Wilkinson is a 53 y.o. female with medical history significant of stress-induced seizures, alcohol abuse, chronic hyponatremia, PTSD depression anxiety type 2 diabetes, hypertension hyperlipidemia history of TB which was treated admitted with complaints of nausea vomiting diarrhea for the last 2 days.  She has been under a lot of stress, her husband passed away last year and has 2 daughters 3014 and 52 years old 468 year old is in foster care and the 52 year old ran away from home  and has been missing for the last 1 week. She lives alone does not go out of the house much.  She has not had any sick contacts.  She denies any fever or chills cough abdominal pain urinary complaints. She reports she had stress-induced seizure 5 to 6 years ago, none since then. She reports drinking alcohol twice a week approximately 40 ounce of beer twice a week.  She gets shakes if she does not drink. She was told she has chronic hyponatremia and the doctor has told her to put salt in her diet. She complains of lightheadedness upon standing, denies chest pain shortness of breath dyspnea on exertion. She has very poor appetite and poor p.o. intake. 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  She was admitted to the hospital May 2020 with vasovagal syncope.  She was treated with epinephrine drip.   ED Course: Patient received 1 L of normal saline.  Initial blood pressure was 70/40 improved to 80/50 after 1 L normal saline.  Her heart rate was 70 upon arrival to the ER dropped to the 40s overnight and back up again in the high 50s to 60s.  Last blood pressure I have is 100/66 temperature 97.8 pulse of 58 respiration 18.  She was  orthostatic in the ER.  Review of Systems: As per HPI otherwise all other systems reviewed and are negative  Ambulatory Status: She is ambulatory at baseline  Past Medical History:  Diagnosis Date   Anemia    when in late teens   Anxiety    Breast abscess    Chlamydia    Depression    PTSD also   Diabetes mellitus without complication ([facility])    Foot pain, bilateral    "burning"   Gonorrhea    High cholesterol    Hypercholesteremia    Hypertension    Recurrent upper respiratory infection (URI)    URI/SOB-treated with antibiotics-cleared up now   Seizures ([facility])    Substance abuse ([facility])    alcohol   Tuberculosis    was treated    Past Surgical History:  Procedure Laterality Date   BREAST BIOPSY  11/13/2010   BREAST LUMPECTOMY     INCISE AND DRAIN ABCESS  01/03/11   breast abscess   TONGUE SURGERY  2008   lump removed    Social History   Socioeconomic History   Marital status: Married    Spouse name: Not on file   Number of children: Not on file   Years of education: GED [id number]   Highest education level: Not on file  Occupational History   Occupation: unemployed  [employer]   Financial resource strain: Not on file   Food insecurity    Worry: Not on file  Inability: Not on file   Transportation needs    Medical: Not on file    Non-medical: Not on file  Tobacco Use   Smoking status: Current Every Day Smoker    Packs/day: 1.00    Types: Cigarettes    Last attempt to quit: 12/11/2012    Years since quitting: 5.9   Smokeless tobacco: Never Used   Tobacco comment: 1 pack ever 3 days  Substance and Sexual Activity   Alcohol use: Yes    Comment: Beer on weekends   Drug use: No   Sexual activity: Not on file  Lifestyle   Physical activity    Days per week: Not on file    Minutes per session: Not on file   Stress: Not on file  Relationships   Social connections    Talks on phone: Not on file    Gets together: Not  on file    Attends religious service: Not on file    Active member of club or organization: Not on file    Attends meetings of clubs or organizations: Not on file    Relationship status: Not on file   Intimate partner violence    Fear of current or ex partner: Not on file    Emotionally abused: Not on file    Physically abused: Not on file    Forced sexual activity: Not on file  Other Topics Concern   Not on file  Social History Narrative   Not on file    No Known Allergies  Family History  Problem Relation Age of Onset   Breast cancer Mother    Breast cancer Sister    Breast cancer Maternal Aunt     Prior to Admission medications   Medication Sig Start Date End Date Taking? Authorizing Provider  atorvastatin (LIPITOR) 40 MG tablet Take 1 tablet (40 mg total) by mouth daily for 30 days. IM Program. 04/24/18 11/13/18 Yes Bloomfield, Carley D, DO  calcium carbonate (OS-CAL - DOSED IN MG OF ELEMENTAL CALCIUM) 1250 (500 Ca) MG tablet Take 1 tablet by mouth daily with breakfast.   Yes [provider]  carvedilol (COREG) 6.25 MG tablet Take 1 tablet (6.25 mg total) by mouth 2 (two) times daily. 07/01/18 12/28/18 Yes Tyson Alias, MD  ibuprofen (ADVIL) 200 MG tablet Take 400-600 mg by mouth every 6 (six) hours as needed for headache.   Yes [provider]  lisinopril (PRINIVIL,ZESTRIL) 40 MG tablet Take 1 tablet (40 mg total) by mouth daily. 12/29/17  Yes Rehman, Areeg N, DO  MAGNESIUM PO Take 1 tablet by mouth daily.   Yes [provider]  metFORMIN (GLUCOPHAGE) 1000 MG tablet Take 1 tablet (1,000 mg total) by mouth 2 (two) times daily with a meal. 06/29/18  Yes Burns Spain, MD  sertraline (ZOLOFT) 50 MG tablet Take 1 tablet (50 mg total) by mouth daily. 05/24/18  Yes Masoudi, Elhamalsadat, MD  gabapentin (NEURONTIN) 300 MG capsule Take 1 capsule (300 mg total) by mouth 2 (two) times daily. Patient not taking: Reported on 11/13/2018 09/09/18  12/08/18  Lenward Chancellor D, DO  ramelteon (ROZEREM) 8 MG tablet Take 1 tablet (8 mg total) by mouth at bedtime. Patient not taking: Reported on 11/13/2018 05/23/18   Chevis Pretty, MD  thiamine 100 MG tablet Take 1 tablet (100 mg total) by mouth daily. Patient not taking: Reported on 11/13/2018 05/24/18   Chevis Pretty, MD    Physical Exam: Vitals:   11/13/18 1610 11/13/18 0532  11/13/18 0630 11/13/18 0700  BP: (!) 86/42 (!) 103/59 94/61 (!) 99/56  Pulse: (!) 56 60 (!) 43 (!) 46  Resp: 17 18 13 12   Temp:      TempSrc:      SpO2: 98% 98% 95% 99%  Weight:      Height:          General:Appears in mild distress patient in reverse Trendelenburg Trendelenburg's position  Eyes: PERRL, EOMI, normal lids, iris  ENT:  grossly normal hearing, lips & tongue, oral mucosa dry  Neck: no LAD, masses or thyromegaly  Cardiovascular: RRR, no m/r/g. No LE edema.   Respiratory: CTA bilaterally, no w/r/r. Normal respiratory effort.  Abdomen:  soft, ntnd, NABS  Skin:  no rash or induration seen on limited exam  Musculoskeletal:  grossly normal tone BUE/BLE, good ROM, no bony abnormality  Psychiatric: Depressed mood and affect, tearful  Neurologic:  CN 2-12 grossly intact, moves all extremities in coordinated fashion, sensation intact  Labs on Admission: I have personally reviewed following labs and imaging studies  CBC: Recent Labs  Lab 11/13/18 0321 11/13/18 0340  WBC 5.1  --   NEUTROABS 2.7  --   HGB 10.0* 10.9*  HCT 29.3* 32.0*  MCV 84.0  --   PLT 128*  --    Basic Metabolic Panel: Recent Labs  Lab 11/13/18 0340 11/13/18 0421  NA 121* 120*  K 3.8 4.1  CL 88* 90*  CO2  --  19*  GLUCOSE 99 135*  BUN 4* 6  CREATININE 0.70 0.56  CALCIUM  --  7.9*   GFR: Estimated Creatinine Clearance: 76.7 mL/min (by C-G formula based on SCr of 0.56 mg/dL). Liver Function Tests: No results for input(s): AST, ALT, ALKPHOS, BILITOT, PROT, ALBUMIN in the last 168  hours. No results for input(s): LIPASE, AMYLASE in the last 168 hours. No results for input(s): AMMONIA in the last 168 hours. Coagulation Profile: No results for input(s): INR, PROTIME in the last 168 hours. Cardiac Enzymes: No results for input(s): CKTOTAL, CKMB, CKMBINDEX, TROPONINI in the last 168 hours. BNP (last 3 results) No results for input(s): PROBNP in the last 8760 hours. HbA1C: No results for input(s): HGBA1C in the last 72 hours. CBG: No results for input(s): GLUCAP in the last 168 hours. Lipid Profile: No results for input(s): CHOL, HDL, LDLCALC, TRIG, CHOLHDL, LDLDIRECT in the last 72 hours. Thyroid Function Tests: No results for input(s): TSH, T4TOTAL, FREET4, T3FREE, THYROIDAB in the last 72 hours. Anemia Panel: No results for input(s): VITAMINB12, FOLATE, FERRITIN, TIBC, IRON, RETICCTPCT in the last 72 hours. Urine analysis:    Component Value Date/Time   COLORURINE YELLOW 11/13/2018 0641   APPEARANCEUR CLEAR 11/13/2018 0641   APPEARANCEUR Clear 02/13/2016 1623   LABSPEC 1.002 (L) 11/13/2018 0641   PHURINE 6.0 11/13/2018 0641   GLUCOSEU NEGATIVE 11/13/2018 0641   HGBUR NEGATIVE 11/13/2018 0641   BILIRUBINUR NEGATIVE 11/13/2018 0641   BILIRUBINUR Negative 05/21/2016 1651   BILIRUBINUR Negative 02/13/2016 1623   KETONESUR NEGATIVE 11/13/2018 0641   PROTEINUR NEGATIVE 11/13/2018 0641   UROBILINOGEN 0.2 05/21/2016 1651   UROBILINOGEN 1.0 12/08/2014 1355   NITRITE NEGATIVE 11/13/2018 0641   LEUKOCYTESUR NEGATIVE 11/13/2018 0641    Creatinine Clearance: Estimated Creatinine Clearance: 76.7 mL/min (by C-G formula based on SCr of 0.56 mg/dL).  Sepsis Labs: @LABRCNTIP (procalcitonin:4,lacticidven:4) )No results found for this or any previous visit (from the past 240 hour(s)).   Radiological Exams on Admission: No results found.  EKG: Independently reviewed has  prolonged PR interval similar to the EKG from May 2020.  Assessment/Plan Active Problems:    Hyponatremia  #1 hypovolemic hyponatremia chronic-secondary to decreased p.o. intake and nutritional deficiency.  She is awake alert oriented.  She received 1 L of normal saline in the ER.  I will continue normal saline and follow-up for labs. Urine osmolality serum osmolality  #2 hypotension secondary to dehydration and minimal p.o. intake.  She was orthostatic in the ER.  She has a history of hypertension and is on Coreg and ACE inhibitor which she has taken prior to coming into the hospital.  Hold antihypertensives.  Continue IV fluids normal saline. No infectious etiology identified she has normal white count she is afebrile No cardiac etiology identified she has no cardiac symptoms.  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  Last hemoglobin A1c 5.25 July 2018  #4 alcohol dependence/abuse-CIWA protocol.  Check magnesium.  #5 hyperlipidemia continue Lipitor 40 mg daily  #6 depression/anxiety/prolonged grieving-consult social worker see if there is anything they can offer in terms of counseling when she leaves the hospital.  Continue Zoloft  #7 abnormal EKG with prolonged PR interval question Wenckebach's repeat EKG likely chronic.  EKG from May 2020 shows similar findings.  #8 miscellaneous continue Os-Cal  #10 normocytic anemia hemoglobin 10.9 on admission down from 12.26 May 2018 check anemia panel no evidence of active bleeding check FOBT  #11 mild thrombocytopenia monitor closely platelets 128 on admission down from 462 May 2020  Severity of Illness: The appropriate patient status for this patient is INPATIENT. Inpatient status is judged to be reasonable and necessary in order to provide the required intensity of service to ensure the patient's safety. The patient's presenting symptoms, physical exam findings, and initial radiographic and laboratory data in the context of their chronic  comorbidities is felt to place them at high risk for further clinical deterioration. Furthermore, it is not anticipated that the patient will be medically stable for discharge from the hospital within 2 midnights of admission. The following factors support the patient status of inpatient.   " The patient's presenting symptoms include nausea vomiting diarrhea. " The worrisome physical exam findings include dry mucous membrane hypotension or bradycardia " The initial radiographic and laboratory data are worrisome because of hyponatremia " The chronic co-morbidities include type 2 diabetes hypertension major depression prolonged grieving vasovagal syncope neuropathy insomnia alcohol abuse   * I certify that at the point of admission it is my clinical judgment that the patient will require inpatient hospital care spanning beyond 2 midnights from the point of admission due to high intensity of service, high risk for further deterioration and high frequency of surveillance required.*  Estimated body mass index is 29.26 kg/m as calculated from the following:   Height as of this encounter: 5\' 2"  (1.575 m).   Weight as of this encounter: 72.6 kg.   DVT prophylaxis: Lovenox Code Status: Full code Family Communication: No family available Disposition Plan: Pending clinical improvement Consults called none Admission status: Inpatient   Georgette Shell MD [facility]  If 7PM-7AM, please contact night-coverage [email or web address] Password [email or web address]  11/13/2018, 7:45 AM

## 2018-11-13 NOTE — Evaluation (Signed)
Physical Therapy Evaluation Patient Details Name: [name] MRN: [id number] DOB: January 16, 1966 Today's Date: 11/13/2018   History of Present Illness  Pt is a 53 y.o. female with medical history significant of stress-induced seizures, alcohol abuse, chronic hyponatremia, PTSD depression anxiety type 2 diabetes, hypertension hyperlipidemia history of TB which was treated admitted with complaints of nausea vomiting diarrhea for the last 2 days.    Clinical Impression  Pt presenting with baseline mobility.  She demonstrated safe gait and transfers.  From PT perspective, pt safe to return home alone when discharged by physician.    Follow Up Recommendations No PT follow up    Equipment Recommendations  Other (comment)(shower chair - discussed with pt)    Recommendations for Other Services       Precautions / Restrictions Precautions Precautions: Fall      Mobility  Bed Mobility Overal bed mobility: Independent             General bed mobility comments: Pt at EOB upon arrival  Transfers Overall transfer level: Needs assistance   Transfers: Sit to/from Stand Sit to Stand: Supervision            Ambulation/Gait Ambulation/Gait assistance: Supervision Gait Distance (Feet): 400 Feet Assistive device: None Gait Pattern/deviations: WFL(Within Functional Limits)     General Gait Details: Able to look up/down/L/R, stop, turn, step over object, and change speeds without LOB  Stairs            Wheelchair Mobility    Modified Rankin (Stroke Patients Only)       Balance Overall balance assessment: Independent                               Standardized Balance Assessment Standardized Balance Assessment : Berg Balance Test Berg Balance Test Sit to Stand: Able to stand without using hands and stabilize independently Standing Unsupported: Able to stand safely 2 minutes Sitting with Back Unsupported but Feet Supported on Floor or Stool:  Able to sit safely and securely 2 minutes Stand to Sit: Sits safely with minimal use of hands Transfers: Able to transfer safely, minor use of hands Standing Unsupported with Eyes Closed: Able to stand 10 seconds safely Standing Ubsupported with Feet Together: Able to place feet together independently and stand for 1 minute with supervision From Standing, Reach Forward with Outstretched Arm: Can reach confidently >25 cm (10") From Standing Position, Pick up Object from Floor: Able to pick up shoe safely and easily From Standing Position, Turn to Look Behind Over each Shoulder: Looks behind from both sides and weight shifts well Turn 360 Degrees: Able to turn 360 degrees safely in 4 seconds or less Standing Unsupported, Alternately Place Feet on Step/Stool: Able to stand independently and complete 8 steps >20 seconds Standing Unsupported, One Foot in Front: Able to take small step independently and hold 30 seconds Standing on One Leg: Able to lift leg independently and hold 5-10 seconds Total Score: 51         Pertinent Vitals/Pain Pain Assessment: No/denies pain    Home Living Family/patient expects to be discharged to:: Private residence Living Arrangements: Alone   Type of Home: House Home Access: Level entry     Home Layout: One level Home Equipment: Environmental consultant - 4 wheels;Cane - single point      Prior Function Level of Independence: Needs assistance   Gait / Transfers Assistance Needed: Pt was able to ambulate without AD for  short community distances.  For longer distances she used a rollator due to a "bad R hip"  ADL's / Homemaking Assistance Needed: Independent with ADLs; could not drive due to hx of seizures; reports she does her baths sitting on edge of tub b/c afraid of falling - recommended shower chair        Hand Dominance        Extremity/Trunk Assessment   Upper Extremity Assessment Upper Extremity Assessment: Overall WFL for tasks assessed    Lower  Extremity Assessment Lower Extremity Assessment: Overall WFL for tasks assessed    Cervical / Trunk Assessment Cervical / Trunk Assessment: Normal  Communication   Communication: No difficulties  Cognition Arousal/Alertness: Awake/alert Behavior During Therapy: WFL for tasks assessed/performed Overall Cognitive Status: Within Functional Limits for tasks assessed                                        General Comments General comments (skin integrity, edema, etc.): Pt reports she feels she is at her baseline    Exercises     Assessment/Plan    PT Assessment Patent does not need any further PT services  PT Problem List         PT Treatment Interventions      PT Goals (Current goals can be found in the Care Plan section)  Acute Rehab PT Goals Patient Stated Goal: return home PT Goal Formulation: With patient    Frequency     Barriers to discharge        Co-evaluation               AM-PAC PT "6 Clicks" Mobility  Outcome Measure Help needed turning from your back to your side while in a flat bed without using bedrails?: None Help needed moving from lying on your back to sitting on the side of a flat bed without using bedrails?: None Help needed moving to and from a bed to a chair (including a wheelchair)?: None Help needed standing up from a chair using your arms (e.g., wheelchair or bedside chair)?: None Help needed to walk in hospital room?: None Help needed climbing 3-5 steps with a railing? : None 6 Click Score: 24    End of Session Equipment Utilized During Treatment: Gait belt Activity Tolerance: Patient tolerated treatment well Patient left: in bed;with bed alarm set Nurse Communication: Mobility status      Time: 1530-1550 PT Time Calculation (min) (ACUTE ONLY): 20 min   Charges:   PT Evaluation $PT Eval Low Complexity: 1 Low         [name], PT Acute Rehab Services [phone]   Rayetta Humphrey 11/13/2018, 4:11  PM

## 2018-11-13 NOTE — Progress Notes (Signed)
RN received call from Tele. Pt showing 2 degree HB Type 2 on tele, asymptomatic.   Spoke with Dr. Rodena Piety, pt has h/o of cardiac and recent EKG, no orders at this time for cardiac. Per MD, will decrease IVF and order Magnesium for low Mg2+.  Discussed plan with pt. Pt had no questions at this time.

## 2018-11-13 NOTE — ED Notes (Addendum)
Admitting MD made aware of drop in HR (heart rate in 40s currently, was in 70s upon arrival to ED). Patient in NAD at this time.
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
## 2018-11-13 NOTE — ED Notes (Signed)
Patient is requesting ensure to drink. Admitting MD paged to request diet order.

## 2018-11-13 NOTE — ED Triage Notes (Addendum)
Patient coming from home with complaints of hypotension and N/V/D. Patient has had decreased appetite starting this morning. Patient states that she has been stressed and depressed which has caused her appetite to decline. When attempting to stand, patient endorses dizziness and lightheadedness, but did not have any syncopal episodes. Patient has had two 40s to drink tonight.   Initial BP was 70/40 with an improvement to 80/50 after 1 L of NS.

## 2018-11-13 NOTE — Progress Notes (Signed)
Initial Nutrition Assessment  RD working remotely.   DOCUMENTATION CODES:   Not applicable  INTERVENTION:  - continue to encourage PO intakes.  - recommend determining if affordable outpatient emotional support therapy is available.    NUTRITION DIAGNOSIS:   Inadequate oral intake related to social / environmental circumstances as evidenced by per patient/family report.  GOAL:   Patient will meet greater than or equal to 90% of their needs  MONITOR:   PO intake, Labs, Weight trends  REASON FOR ASSESSMENT:   Consult Poor PO  ASSESSMENT:   53 y.o. female with medical history significant of stress-induced seizures, alcohol abuse, chronic hyponatremia, PTSD, depression, anxiety, type 2 DM, HTN hyperlipidemia, history of TB which was treated. She was admitted with complaints of N/V/D x2 days.  She has been under a lot of stress. Her husband passed away last year. She has two daughters: 34 years-old and 52 years-old. The 52 year-old is in foster care and the 52 year-old ran away from home last week and has been missing since that time. She has been drinking ~40 ounces of beer twice/week; she does get tremors if she does not drink. She was told by a doctor in the past that she should add salt to her food d/t chronic hyponatremia.   Per flow sheet, patient consumed 100% of lunch today. Per chart review, current weight is 160 lb and weight on 08/03/18 was 164 lb. This indicates 4 lb weight loss (2.4% body weight) in the past 3.5 months; not significant for time frame.    Per notes: - hypovolemic hyponatremia 2/2 decreased PO intakes - hypotension 2/2 dehydration and poor PO intake - type 2 DM with neuropathy--most recent A1c (07/2018): 5.4% - alcohol dependence/abuse--CIWA - depression, anxiety, prolonged grieving   Labs reviewed; Na: 124 mmol/l, Cl: 92 mmol/l, BUN: 5 mg/dl, Ca: 8.6 mg/dl. Medications reviewed; 1 mg folvite/day, sliding scale novolog, 1 g IV Mg sulfate x1 run  10/23, daily multivitamin with minerals, 100 mg oral thiamine/day, 1000 mcg oral cyanocobalamin/day.  IVF; NS @ 75 ml/hr.     NUTRITION - FOCUSED PHYSICAL EXAM:  unable to complete at this time.   Diet Order:   Diet Order            DIET SOFT Room service appropriate? Yes; Fluid consistency: Thin  Diet effective now              EDUCATION NEEDS:   No education needs have been identified at this time  Skin:  Skin Assessment: Reviewed RN Assessment  Last BM:  10/23  Height:   Ht Readings from Last 1 Encounters:  11/13/18 5\' 2"  (1.575 m)    Weight:   Wt Readings from Last 1 Encounters:  11/13/18 72.6 kg    Ideal Body Weight:  50 kg  BMI:  Body mass index is 29.26 kg/m.  Estimated Nutritional Needs:   Kcal:  1800-2000 kcal  Protein:  75-90 grams  Fluid:  >/= 2 L/day      Jarome Matin, MS, RD, LDN, [facility] Inpatient Clinical Dietitian Pager # [phone] After hours/weekend pager # [phone]

## 2018-11-13 NOTE — ED Provider Notes (Signed)
[facility] DEPT Provider Note   CSN: [id number] Arrival date & time: 11/13/18  0315     History   Chief Complaint Chief Complaint  Patient presents with  . Hypotension  . N/V/D    HPI [name] is a 53 y.o. female.     The history is provided by the patient.  Illness Location:  At home Quality:  Nausea vomiting and diarrhea but able to hold down 2 40s Severity:  Moderate Onset quality:  Gradual Duration:  20 hours Timing:  Constant Progression:  Worsening Chronicity:  New Context:  Also able to hold down her BP medications, and her BP is low Relieved by:  Nothing Worsened by:  Poor oral intake and vomiting and diarrhea coupled with clood pressure medications Ineffective treatments:  None tried Associated symptoms: diarrhea, nausea and vomiting   Associated symptoms: no abdominal pain, no chest pain, no congestion, no cough, no ear pain, no fatigue, no fever, no headaches, no loss of consciousness, no myalgias, no rash, no rhinorrhea, no shortness of breath, no sore throat and no wheezing   Risk factors:  Substance abuse   Past Medical History:  Diagnosis Date  . Anemia    when in late teens  . Anxiety   . Breast abscess   . Chlamydia   . Depression    PTSD also  . Diabetes mellitus without complication ([place])   . Foot pain, bilateral    "burning"  . Gonorrhea   . High cholesterol   . Hypercholesteremia   . Hypertension   . Recurrent upper respiratory infection (URI)    URI/SOB-treated with antibiotics-cleared up now  . Seizures ([place])   . Substance abuse ([place])    alcohol  . Tuberculosis    was treated    Patient Active Problem List   Diagnosis Date Noted  . Grief 12/25/2017  . Healthcare maintenance 11/12/2016  . Occipital neuralgia of left side 06/21/2016  . Dysuria 05/21/2016  . Avascular necrosis ([place]) 03/01/2016  . Tobacco use disorder 04/12/2015  . Abnormal brain MRI 03/10/2015  . Elevated liver  enzymes 04/14/2013  . Diabetic neuropathy, painful ([place]) 04/14/2013  . Alcohol dependence ([place]) 03/23/2013  . Substance induced mood disorder ([place]) 03/22/2013  . Depression 02/05/2012  . Hyperlipidemia 02/05/2012  . Hypertension 01/07/2012  . Type 2 diabetes mellitus with microalbuminuria, without long-term current use of insulin ([place]) 01/07/2012    Past Surgical History:  Procedure Laterality Date  . BREAST BIOPSY  11/13/2010  . BREAST LUMPECTOMY    . INCISE AND DRAIN ABCESS  01/03/11   breast abscess  . TONGUE SURGERY  2008   lump removed     OB History    Gravida  2   Para  2   Term  2   Preterm      AB      Living  2     SAB      TAB      Ectopic      Multiple      Live Births               Home Medications    Prior to Admission medications   Medication Sig Start Date End Date Taking? Authorizing Provider  atorvastatin (LIPITOR) 40 MG tablet Take 1 tablet (40 mg total) by mouth daily for 30 days. IM Program. 04/24/18 11/13/18 Yes Bloomfield, Carley D, DO  calcium carbonate (OS-CAL - DOSED IN MG OF ELEMENTAL CALCIUM) 1250 (500  Ca) MG tablet Take 1 tablet by mouth daily with breakfast.   Yes [provider]  carvedilol (COREG) 6.25 MG tablet Take 1 tablet (6.25 mg total) by mouth 2 (two) times daily. 07/01/18 12/28/18 Yes Tyson Alias, MD  ibuprofen (ADVIL) 200 MG tablet Take 400-600 mg by mouth every 6 (six) hours as needed for headache.   Yes [provider]  lisinopril (PRINIVIL,ZESTRIL) 40 MG tablet Take 1 tablet (40 mg total) by mouth daily. 12/29/17  Yes Rehman, Areeg N, DO  MAGNESIUM PO Take 1 tablet by mouth daily.   Yes [provider]  metFORMIN (GLUCOPHAGE) 1000 MG tablet Take 1 tablet (1,000 mg total) by mouth 2 (two) times daily with a meal. 06/29/18  Yes Burns Spain, MD  sertraline (ZOLOFT) 50 MG tablet Take 1 tablet (50 mg total) by mouth daily. 05/24/18  Yes Masoudi, Elhamalsadat, MD  gabapentin  (NEURONTIN) 300 MG capsule Take 1 capsule (300 mg total) by mouth 2 (two) times daily. Patient not taking: Reported on 11/13/2018 09/09/18 12/08/18  Lenward Chancellor D, DO  ramelteon (ROZEREM) 8 MG tablet Take 1 tablet (8 mg total) by mouth at bedtime. Patient not taking: Reported on 11/13/2018 05/23/18   Chevis Pretty, MD  thiamine 100 MG tablet Take 1 tablet (100 mg total) by mouth daily. Patient not taking: Reported on 11/13/2018 05/24/18   Chevis Pretty, MD    Family History Family History  Problem Relation Age of Onset  . Breast cancer Mother   . Breast cancer Sister   . Breast cancer Maternal Aunt     Social History Social History   Tobacco Use  . Smoking status: Current Every Day Smoker    Packs/day: 1.00    Types: Cigarettes    Last attempt to quit: 12/11/2012    Years since quitting: 5.9  . Smokeless tobacco: Never Used  . Tobacco comment: 1 pack ever 3 days  Substance Use Topics  . Alcohol use: Yes    Comment: Beer on weekends  . Drug use: No     Allergies   Patient has no known allergies.   Review of Systems Review of Systems  Constitutional: Negative for fatigue and fever.  HENT: Negative for congestion, ear pain, rhinorrhea and sore throat.   Eyes: Negative for visual disturbance.  Respiratory: Negative for cough, shortness of breath and wheezing.   Cardiovascular: Negative for chest pain.  Gastrointestinal: Positive for diarrhea, nausea and vomiting. Negative for abdominal pain.  Genitourinary: Negative for difficulty urinating.  Musculoskeletal: Negative for myalgias.  Skin: Negative for rash.  Neurological: Negative for loss of consciousness and headaches.  Psychiatric/Behavioral: Negative for agitation.  All other systems reviewed and are negative.    Physical Exam Updated Vital Signs BP 90/62 (BP Location: Right Arm)   Pulse 75   Temp 97.8 F (36.6 C) (Oral)   Resp 18   Ht  (1.575 m)   Wt 72.6 kg   LMP 01/22/2013    SpO2 96%   BMI 29.26 kg/m   Physical Exam Vitals signs and nursing note reviewed.  Constitutional:      General: She is not in acute distress.    Appearance: She is normal weight.  HENT:     Head: Normocephalic and atraumatic.     Nose: Nose normal.  Eyes:     Conjunctiva/sclera: Conjunctivae normal.     Pupils: Pupils are equal, round, and reactive to light.  Neck:     Musculoskeletal: Normal range of  motion and neck supple.  Cardiovascular:     Rate and Rhythm: Normal rate and regular rhythm.     Pulses: Normal pulses.     Heart sounds: Normal heart sounds.  Pulmonary:     Effort: Pulmonary effort is normal.     Breath sounds: Normal breath sounds.  Abdominal:     General: Abdomen is flat. Bowel sounds are normal.     Tenderness: There is no abdominal tenderness. There is no guarding or rebound.  Musculoskeletal: Normal range of motion.  Skin:    General: Skin is warm and dry.     Capillary Refill: Capillary refill takes less than 2 seconds.  Neurological:     General: No focal deficit present.     Mental Status: She is alert and oriented to person, place, and time.     Deep Tendon Reflexes: Reflexes normal.  Psychiatric:        Mood and Affect: Mood normal.        Behavior: Behavior normal.      ED Treatments / Results  Labs (all labs ordered are listed, but only abnormal results are displayed) Results for orders placed or performed during the hospital encounter of 11/13/18  CBC with Differential/Platelet  Result Value Ref Range   WBC 5.1 4.0 - 10.5 K/uL   RBC 3.49 (L) 3.87 - 5.11 MIL/uL   Hemoglobin 10.0 (L) 12.0 - 15.0 g/dL   HCT 09.6 (L) 28.3 - 66.2 %   MCV 84.0 80.0 - 100.0 fL   MCH 28.7 26.0 - 34.0 pg   MCHC 34.1 30.0 - 36.0 g/dL   RDW 94.7 65.4 - 65.0 %   Platelets 128 (L) 150 - 400 K/uL   nRBC 0.0 0.0 - 0.2 %   Neutrophils Relative % 54 %   Neutro Abs 2.7 1.7 - 7.7 K/uL   Lymphocytes Relative 35 %   Lymphs Abs 1.8 0.7 - 4.0 K/uL   Monocytes  Relative 8 %   Monocytes Absolute 0.4 0.1 - 1.0 K/uL   Eosinophils Relative 3 %   Eosinophils Absolute 0.2 0.0 - 0.5 K/uL   Basophils Relative 0 %   Basophils Absolute 0.0 0.0 - 0.1 K/uL   Immature Granulocytes 0 %   Abs Immature Granulocytes 0.01 0.00 - 0.07 K/uL  Basic metabolic panel  Result Value Ref Range   Sodium 120 (L) 135 - 145 mmol/L   Potassium 4.1 3.5 - 5.1 mmol/L   Chloride 90 (L) 98 - 111 mmol/L   CO2 19 (L) 22 - 32 mmol/L   Glucose, Bld 135 (H) 70 - 99 mg/dL   BUN 6 6 - 20 mg/dL   Creatinine, Ser 3.54 0.44 - 1.00 mg/dL   Calcium 7.9 (L) 8.9 - 10.3 mg/dL   GFR calc non Af Amer >60 >60 mL/min   GFR calc Af Amer >60 >60 mL/min   Anion gap 11 5 - 15  I-stat chem 8, ED (not at [facility] or [facility])  Result Value Ref Range   Sodium 121 (L) 135 - 145 mmol/L   Potassium 3.8 3.5 - 5.1 mmol/L   Chloride 88 (L) 98 - 111 mmol/L   BUN 4 (L) 6 - 20 mg/dL   Creatinine, Ser 6.56 0.44 - 1.00 mg/dL   Glucose, Bld 99 70 - 99 mg/dL   Calcium, Ion 8.12 (L) 1.15 - 1.40 mmol/L   TCO2 19 (L) 22 - 32 mmol/L   Hemoglobin 10.9 (L) 12.0 - 15.0 g/dL   HCT  32.0 (L) 36.0 - 46.0 %  I-Stat Beta hCG blood, ED ([facility], [facility], [facility] only)  Result Value Ref Range   I-stat hCG, quantitative <5.0 <5 mIU/mL   Comment 3           No results found.]  EKG None  Radiology No results found.  Procedures Procedures (including critical care time)  Medications Ordered in ED Medications  sodium chloride 0.9 % 1,000 mL with thiamine 500 mg, folic acid 1 mg, multivitamins adult 10 mL, magnesium sulfate 2 g infusion (has no administration in time range)  0.9 %  sodium chloride infusion (has no administration in time range)  ondansetron (ZOFRAN) injection 4 mg (4 mg Intravenous Given 11/13/18 0411)  sodium chloride 0.9 % bolus 500 mL (0 mLs Intravenous Stopped 11/13/18 0430)  glucagon (human recombinant) (GLUCAGEN) injection 1 mg (1 mg Intravenous Given 11/13/18 0411)     Sodium is low on multiple sticks.   Correction calculation is 574 cc/hr.  Also starting banana bag due to alcoholism.    This is the second time she has has been seen for vagal issues vs low BP.  I suspect she does not need BP medications.    Final Clinical Impressions(s) / ED Diagnoses   Final diagnoses:  Hypotension, unspecified hypotension type  Hyponatremia  Alcohol abuse   IVF started, covid pending. Admit to medicine    Izaih Kataoka, MD 11/13/18 [id number]

## 2018-11-13 NOTE — ED Notes (Signed)
Cardiac monitoring initiated

## 2018-11-13 NOTE — ED Notes (Signed)
EDP called to bedside as patient's BP is low despite fluid administration. Patient placed in reverse trendelenburg per EDP instruction.

## 2018-11-14 DIAGNOSIS — I959 Hypotension, unspecified: Secondary | ICD-10-CM | POA: Diagnosis present

## 2018-11-14 DIAGNOSIS — E871 Hypo-osmolality and hyponatremia: Secondary | ICD-10-CM

## 2018-11-14 LAB — CBC
HCT: 27.8 % — ABNORMAL LOW (ref 36.0–46.0)
Hemoglobin: 9.4 g/dL — ABNORMAL LOW (ref 12.0–15.0)
MCH: 29 pg (ref 26.0–34.0)
MCHC: 33.8 g/dL (ref 30.0–36.0)
MCV: 85.8 fL (ref 80.0–100.0)
Platelets: 141 10*3/uL — ABNORMAL LOW (ref 150–400)
RBC: 3.24 MIL/uL — ABNORMAL LOW (ref 3.87–5.11)
RDW: 15.2 % (ref 11.5–15.5)
WBC: 5.2 10*3/uL (ref 4.0–10.5)
nRBC: 0 % (ref 0.0–0.2)

## 2018-11-14 LAB — GLUCOSE, CAPILLARY
Glucose-Capillary: 83 mg/dL (ref 70–99)
Glucose-Capillary: 109 mg/dL — ABNORMAL HIGH (ref 70–99)

## 2018-11-14 LAB — COMPREHENSIVE METABOLIC PANEL
ALT: 70 U/L — ABNORMAL HIGH (ref 0–44)
AST: 57 U/L — ABNORMAL HIGH (ref 15–41)
Albumin: 3.2 g/dL — ABNORMAL LOW (ref 3.5–5.0)
Alkaline Phosphatase: 67 U/L (ref 38–126)
Anion gap: 8 (ref 5–15)
BUN: 7 mg/dL (ref 6–20)
CO2: 22 mmol/L (ref 22–32)
Calcium: 8.4 mg/dL — ABNORMAL LOW (ref 8.9–10.3)
Chloride: 96 mmol/L — ABNORMAL LOW (ref 98–111)
Creatinine, Ser: 0.52 mg/dL (ref 0.44–1.00)
GFR calc Af Amer: >60 mL/min (ref >60)
GFR calc non Af Amer: >60 mL/min (ref >60)
Glucose, Bld: 80 mg/dL (ref 70–99)
Potassium: 4.3 mmol/L (ref 3.5–5.1)
Sodium: 126 mmol/L — ABNORMAL LOW (ref 135–145)
Total Bilirubin: 0.5 mg/dL (ref 0.3–1.2)
Total Protein: 6.1 g/dL — ABNORMAL LOW (ref 6.5–8.1)

## 2018-11-14 LAB — MAGNESIUM: Magnesium: 1.4 mg/dL — ABNORMAL LOW (ref 1.7–2.4)

## 2018-11-14 MED ORDER — SODIUM CHLORIDE 1 G PO TABS: 1.0000 g | ORAL_TABLET | Freq: Two times a day (BID) | ORAL | 0 refills | Status: AC

## 2018-11-14 MED ORDER — MAGNESIUM OXIDE 400 MG PO CAPS: 1.0000 | ORAL_CAPSULE | Freq: Every day | ORAL | 0 refills | Status: AC

## 2018-11-14 MED ORDER — LISINOPRIL 10 MG PO TABS: 10.0000 mg | ORAL_TABLET | Freq: Every day | ORAL | 0 refills | Status: DC

## 2018-11-14 MED ORDER — VITAMIN B-1 100 MG PO TABS: 100.0000 mg | ORAL_TABLET | Freq: Every day | ORAL | 0 refills | Status: AC

## 2018-11-14 MED ORDER — FOLIC ACID 1 MG PO TABS: 1.0000 mg | ORAL_TABLET | Freq: Every day | ORAL | 0 refills | Status: AC

## 2018-11-14 MED ORDER — METFORMIN HCL 500 MG PO TABS: 500.0000 mg | ORAL_TABLET | Freq: Two times a day (BID) | ORAL | Status: DC

## 2018-11-14 NOTE — Plan of Care (Signed)
  Problem: Nutrition: Goal: Adequate nutrition will be maintained Outcome: Progressing   Problem: Elimination: Goal: Will not experience complications related to bowel motility Outcome: Progressing   Problem: Pain Managment: Goal: General experience of comfort will improve Outcome: Progressing   

## 2018-11-14 NOTE — Discharge Summary (Signed)
Physician Discharge Summary  [name] NTZ:[id number] DOB: [birth year] DOA: 11/13/2018  PCP: Lenward Chancellor D, DO  Admit date: 11/13/2018 Discharge date: 11/14/2018  Admitted From: Home Disposition:  Home  Discharge Condition:Stable CODE STATUS:FULL, Diet recommendation: Heart Healthy  Brief/Interim Summary: [name] is a 53 y.o. female with medical history significant of stress-induced seizures, alcohol abuse, chronic hyponatremia, PTSD depression anxiety type 2 diabetes, hypertension hyperlipidemia history of TB which was treated admitted with complaints of nausea vomiting diarrhea for the last 2 days.  She was found to have hyponatremia, hypotension on presentation.  Patient also expressed that she is on a lot of stress because her husband recently passed away and her daughters are in foster care.  Patient reported daily excessive alcohol intake.  Patient was started on IV fluids.  This morning she is hemodynamically stable.  Feels much better.  She does not complain of any nausea or vomiting today.  She is actually hypertensive today.  Her sodium level has improved.  Patient really wants to go home.  Social worker has already seen her yesterday and given appropriate services to follow-up with behavioral health services.  Patient is hemodynamically stable for discharge home today.  Following problems were addressed during her hospitalization:  #1  hyponatremia chronic-secondary to decreased p.o. intake and excessive beer intake causing beer portal mania.   Sodium level acceptable today.  Start on salt tablets  #2 hypotension; secondary to dehydration and minimal p.o. intake.  She was orthostatic in the ER.    She was given IV fluids.  She is actually hypertensive today.  I will resume her home meds seen on decreased doses.  I suspect that she is remains mostly hypertensive due to her history of alcohol abuse.  She needs  to continue antihypertensives on  discharge.  #3 type 2 diabetes complicated by neuropathy :A1c of 6.2.  Continue Metformin at 500 mg twice a day  #4 alcohol dependence/abuse-counseled cessation.  Continue thiamine, folic acid, magnesium.  #5 hyperlipidemia continue Lipitor 40 mg daily  #6 depression/anxiety/prolonged grieving-SW consulted .  Appropriate resources provided. Continue Zoloft  #7 thrombocytopenia: Most likely associated l with chronic alcohol abuse.  Continue to monitor as an outpatient    Discharge Diagnoses:  Active Problems:   Type 2 diabetes mellitus with hyperlipidemia (HCC)   Orthostatic hypotension   Hyponatremia   Normocytic anemia   Hypotension    Discharge Instructions  Discharge Instructions    Diet - low sodium heart healthy   Complete by: As directed    Discharge instructions   Complete by: As directed    1)Please stop taking alcohol. 2)Follow up with your PCP in a week. 3)Take prescribed medications as instructed '   Increase activity slowly   Complete by: As directed      Allergies as of 11/14/2018   No Known Allergies     Medication List    STOP taking these medications   gabapentin 300 MG capsule Commonly known as: NEURONTIN   MAGNESIUM PO   ramelteon 8 MG tablet Commonly known as: ROZEREM     TAKE these medications   atorvastatin 40 MG tablet Commonly known as: LIPITOR Take 1 tablet (40 mg total) by mouth daily for 30 days. IM Program.   calcium carbonate 1250 (500 Ca) MG tablet Commonly known as: OS-CAL - dosed in mg of elemental calcium Take 1 tablet by mouth daily with breakfast.   carvedilol 6.25 MG tablet Commonly known as: COREG Take 1 tablet (6.25  mg total) by mouth 2 (two) times daily.   folic acid 1 MG tablet Commonly known as: FOLVITE Take 1 tablet (1 mg total) by mouth daily.   ibuprofen 200 MG tablet Commonly known as: ADVIL Take 400-600 mg by mouth every 6 (six) hours as needed for headache.   lisinopril 40 MG tablet Commonly  known as: ZESTRIL Take 1 tablet (40 mg total) by mouth daily.   Magnesium Oxide 400 MG Caps Take 1 capsule (400 mg total) by mouth daily.   metFORMIN 500 MG tablet Commonly known as: GLUCOPHAGE Take 1 tablet (500 mg total) by mouth 2 (two) times daily with a meal. What changed:   medication strength  how much to take   sertraline 50 MG tablet Commonly known as: ZOLOFT Take 1 tablet (50 mg total) by mouth daily.   sodium chloride 1 g tablet Take 1 tablet (1 g total) by mouth 2 (two) times daily with a meal for 7 days.   thiamine 100 MG tablet Commonly known as: VITAMIN B-1 Take 1 tablet (100 mg total) by mouth daily.      Follow-up Information    [facility] Follow up.   Specialty: Behavioral Health Why: This place has both individual counselors and groups specific to depression and worry.  They can work with Electrical engineer.  Call for an appointment to see if you think it would be a good fit. [employer] [employer] manager information: [address] [place] [address] [phone]       Lenward Chancellor D, DO. Schedule an appointment as soon as possible for a visit in 1 week(s).   Specialty: Internal Medicine Contact information: [address] [place] [address] [phone]          No Known Allergies  Consultations:  None   Procedures/Studies:  No results found.    Subjective: Patient seen and examined the bedside this morning.  Hemodynamically stable for discharge.  Discharge Exam: Vitals:   11/13/18 2023 11/14/18 0522  BP: 129/85 (!) 160/90  Pulse: 67 68  Resp: 18 18  Temp: 98.8 F (37.1 C) 98.2 F (36.8 C)  SpO2: 99% 99%   Vitals:   11/13/18 1306 11/13/18 1856 11/13/18 2023 11/14/18 0522  BP: (!) 129/91 113/89 129/85 (!) 160/90  Pulse: 74 63 67 68  Resp: Temp: 98.2 F (36.8 C) 97.8 F (36.6 C) 98.8 F (37.1 C) 98.2 F (36.8 C)  TempSrc:  Oral  Oral Oral  SpO2: 99% 100% 99% 99%  Weight:      Height:        General: Pt is alert, awake, not in acute distress Cardiovascular: RRR, S1/S2 +, no rubs, no gallops Respiratory: CTA bilaterally, no wheezing, no rhonchi Abdominal: Soft, NT, ND, bowel sounds + Extremities: no edema, no cyanosis    The results of significant diagnostics from this hospitalization (including imaging, microbiology, ancillary and laboratory) are listed below for reference.     Microbiology: Recent Results (from the past 240 hour(s))  SARS CORONAVIRUS 2 (TAT 6-24 HRS) Nasopharyngeal Nasopharyngeal Swab     Status: None   Collection Time: 11/13/18  5:06 AM   Specimen: Nasopharyngeal Swab  Result Value Ref Range Status   SARS Coronavirus 2 NEGATIVE NEGATIVE Final    Comment: (NOTE) SARS-CoV-2 target nucleic acids are NOT DETECTED. The SARS-CoV-2 RNA is generally detectable in upper and lower respiratory specimens during the acute phase of infection.  Negative results do not preclude SARS-CoV-2 infection, do not rule out co-infections with other pathogens, and should not be used as the sole basis for treatment or other patient management decisions. Negative results must be combined with clinical observations, patient history, and epidemiological information. The expected result is Negative. Fact Sheet for Patients: SugarRoll.be Fact Sheet for Healthcare Providers: https://www.woods-mathews.com/ This test is not yet approved or cleared by the [place] FDA and  has been authorized for detection and/or diagnosis of SARS-CoV-2 by FDA under an Emergency Use Authorization (EUA). This EUA will remain  in effect (meaning this test can be used) for the duration of the COVID-19 declaration under Section 56 4(b)(1) of the Act, 21 U.S.C. section 360bbb-3(b)(1), unless the authorization is terminated or revoked sooner. Performed at [facility], [place] [address], [place], [place] [address]      Labs: BNP (last 3 results) No results for input(s): BNP in the last 8760 hours. Basic Metabolic Panel: Recent Labs  Lab 11/13/18 0421 11/13/18 0820 11/13/18 1349 11/13/18 1931 11/14/18 0246  NA 120* 128* 124* 124* 126*  K 4.1 4.7 4.1 4.0 4.3  CL 90* 101 92* 94* 96*  CO2 19* 17* 25 23 22   GLUCOSE 135* 81 130* 129* 80  BUN 6 <5* 5* 6 7  CREATININE 0.56 0.42* 0.63 0.62 0.52  CALCIUM 7.9* 7.1* 8.6* 8.5* 8.4*  MG  --  1.4*  --   --  1.4*  PHOS  --  2.9  --   --   --    Liver Function Tests: Recent Labs  Lab 11/13/18 0820 11/14/18 0246  AST 74* 57*  ALT 72* 70*  ALKPHOS 67 67  BILITOT 1.4* 0.5  PROT 5.9* 6.1*  ALBUMIN 3.2* 3.2*   No results for input(s): LIPASE, AMYLASE in the last 168 hours. No results for input(s): AMMONIA in the last 168 hours. CBC: Recent Labs  Lab 11/13/18 0321 11/13/18 0340 11/13/18 0820 11/14/18 0246  WBC 5.1  --  7.5 5.2  NEUTROABS 2.7  --   --   --   HGB 10.0* 10.9* 10.9* 9.4*  HCT 29.3* 32.0* 32.0* 27.8*  MCV 84.0  --  84.4 85.8  PLT 128*  --  133* 141*   Cardiac Enzymes: No results for input(s): CKTOTAL, CKMB, CKMBINDEX, TROPONINI in the last 168 hours. BNP: Invalid input(s): POCBNP CBG: Recent Labs  Lab 11/13/18 1137 11/13/18 1645 11/13/18 2027 11/14/18 0741  GLUCAP 85 107* 120* 83   D-Dimer No results for input(s): DDIMER in the last 72 hours. Hgb A1c Recent Labs    11/13/18 0821  HGBA1C 6.2*   Lipid Profile No results for input(s): CHOL, HDL, LDLCALC, TRIG, CHOLHDL, LDLDIRECT in the last 72 hours. Thyroid function studies No results for input(s): TSH, T4TOTAL, T3FREE, THYROIDAB in the last 72 hours.  Invalid input(s): FREET3 Anemia work up Recent Labs    11/13/18 0820 11/13/18 0823  VITAMINB12  --  783  FOLATE  --  14.2  FERRITIN  --  174  TIBC  --  213*  IRON  --  183*  RETICCTPCT 2.2  --    Urinalysis    Component Value Date/Time   COLORURINE YELLOW  11/13/2018 [place] 11/13/2018 0641   APPEARANCEUR Clear 02/13/2016 1623   LABSPEC 1.002 (L) 11/13/2018 0641   PHURINE 6.0 11/13/2018 [place] 11/13/2018 [place] NEGATIVE 11/13/2018 [place] 11/13/2018 4132  BILIRUBINUR Negative 05/21/2016 1651   BILIRUBINUR Negative 02/13/2016 1623   KETONESUR NEGATIVE 11/13/2018 0641   PROTEINUR NEGATIVE 11/13/2018 0641   UROBILINOGEN 0.2 05/21/2016 1651   UROBILINOGEN 1.0 12/08/2014 1355   NITRITE NEGATIVE 11/13/2018 0641   LEUKOCYTESUR NEGATIVE 11/13/2018 0641   Sepsis Labs Invalid input(s): PROCALCITONIN,  WBC,  LACTICIDVEN Microbiology Recent Results (from the past 240 hour(s))  SARS CORONAVIRUS 2 (TAT 6-24 HRS) Nasopharyngeal Nasopharyngeal Swab     Status: None   Collection Time: 11/13/18  5:06 AM   Specimen: Nasopharyngeal Swab  Result Value Ref Range Status   SARS Coronavirus 2 NEGATIVE NEGATIVE Final    Comment: (NOTE) SARS-CoV-2 target nucleic acids are NOT DETECTED. The SARS-CoV-2 RNA is generally detectable in upper and lower respiratory specimens during the acute phase of infection. Negative results do not preclude SARS-CoV-2 infection, do not rule out co-infections with other pathogens, and should not be used as the sole basis for treatment or other patient management decisions. Negative results must be combined with clinical observations, patient history, and epidemiological information. The expected result is Negative. Fact Sheet for Patients: HairSlick.nohttps://www.fda.gov/media/138098/download Fact Sheet for Healthcare Providers: quierodirigir.comhttps://www.fda.gov/media/138095/download This test is not yet approved or cleared by the [place] FDA and  has been authorized for detection and/or diagnosis of SARS-CoV-2 by FDA under an Emergency Use Authorization (EUA). This EUA will remain  in effect (meaning this test can be used) for the duration of the COVID-19 declaration under  Section 56 4(b)(1) of the Act, 21 U.S.C. section 360bbb-3(b)(1), unless the authorization is terminated or revoked sooner. Performed at [facility], [address] [place], [place] [address]     Please note: You were cared for by a hospitalist during your hospital stay. Once you are discharged, your primary care physician will handle any further medical issues. Please note that NO REFILLS for any discharge medications will be authorized once you are discharged, as it is imperative that you return to your primary care physician (or establish a relationship with a primary care physician if you do not have one) for your post hospital discharge needs so that they can reassess your need for medications and monitor your lab values.    Time coordinating discharge: 40 minutes  SIGNED:   Burnadette PopAmrit Anup Brigham, MD  [facility] 11/14/2018, 10:42 AM Pager [phone]  If 7PM-7AM, please contact night-coverage [email or web address] Password [email or web address]

## 2018-11-14 NOTE — Discharge Instructions (Signed)

## 2018-11-14 NOTE — Plan of Care (Signed)
  Problem: Education: Goal: Knowledge of General Education information will improve Description: Including pain rating scale, medication(s)/side effects and non-pharmacologic comfort measures Outcome: Adequate for Discharge   Problem: Health Behavior/Discharge Planning: Goal: Ability to manage health-related needs will improve Outcome: Adequate for Discharge   Problem: Clinical Measurements: Goal: Ability to maintain clinical measurements within normal limits will improve Outcome: Adequate for Discharge Goal: Will remain free from infection Outcome: Adequate for Discharge Goal: Diagnostic test results will improve Outcome: Adequate for Discharge Goal: Respiratory complications will improve Outcome: Adequate for Discharge Goal: Cardiovascular complication will be avoided Outcome: Adequate for Discharge   Problem: Activity: Goal: Risk for activity intolerance will decrease 11/14/2018 1250 by Dorene Sorrow, RN Outcome: Adequate for Discharge 11/14/2018 1038 by Dorene Sorrow, RN Outcome: Progressing   Problem: Nutrition: Goal: Adequate nutrition will be maintained 11/14/2018 1250 by Dorene Sorrow, RN Outcome: Adequate for Discharge 11/14/2018 1038 by Dorene Sorrow, RN Outcome: Progressing   Problem: Coping: Goal: Level of anxiety will decrease Outcome: Adequate for Discharge   Problem: Elimination: Goal: Will not experience complications related to bowel motility 11/14/2018 1250 by Dorene Sorrow, RN Outcome: Adequate for Discharge 11/14/2018 1038 by Dorene Sorrow, RN Outcome: Progressing Goal: Will not experience complications related to urinary retention 11/14/2018 1250 by Dorene Sorrow, RN Outcome: Adequate for Discharge 11/14/2018 1038 by Dorene Sorrow, RN Outcome: Progressing   Problem: Pain Managment: Goal: General experience of comfort will improve 11/14/2018 1250 by Dorene Sorrow, RN Outcome: Adequate for  Discharge 11/14/2018 1038 by Dorene Sorrow, RN Outcome: Progressing   Problem: Safety: Goal: Ability to remain free from injury will improve 11/14/2018 1250 by Dorene Sorrow, RN Outcome: Adequate for Discharge 11/14/2018 1038 by Dorene Sorrow, RN Outcome: Progressing   Problem: Skin Integrity: Goal: Risk for impaired skin integrity will decrease 11/14/2018 1250 by Dorene Sorrow, RN Outcome: Adequate for Discharge 11/14/2018 1038 by Dorene Sorrow, RN Outcome: Progressing

## 2018-11-16 ENCOUNTER — Encounter: Admitting: Internal Medicine

## 2018-11-30 ENCOUNTER — Encounter: Admitting: Internal Medicine

## 2018-12-01 ENCOUNTER — Emergency Department (HOSPITAL_COMMUNITY)
Admission: EM | Admit: 2018-12-01 | Discharge: 2018-12-01 | Disposition: A | Discharge status: Home / Self Care | Attending: Emergency Medicine | Admitting: Emergency Medicine

## 2018-12-01 ENCOUNTER — Telehealth: Payer: Self-pay | Admitting: *Deleted

## 2018-12-01 ENCOUNTER — Other Ambulatory Visit: Payer: Self-pay

## 2018-12-01 ENCOUNTER — Encounter (HOSPITAL_COMMUNITY): Payer: Self-pay | Admitting: Emergency Medicine

## 2018-12-01 DIAGNOSIS — E1140 Type 2 diabetes mellitus with diabetic neuropathy, unspecified: Secondary | ICD-10-CM | POA: Diagnosis not present

## 2018-12-01 DIAGNOSIS — Z7984 Long term (current) use of oral hypoglycemic drugs: Secondary | ICD-10-CM | POA: Insufficient documentation

## 2018-12-01 DIAGNOSIS — R519 Headache, unspecified: Secondary | ICD-10-CM | POA: Diagnosis present

## 2018-12-01 DIAGNOSIS — F17210 Nicotine dependence, cigarettes, uncomplicated: Secondary | ICD-10-CM | POA: Insufficient documentation

## 2018-12-01 DIAGNOSIS — I10 Essential (primary) hypertension: Secondary | ICD-10-CM | POA: Diagnosis not present

## 2018-12-01 DIAGNOSIS — E1169 Type 2 diabetes mellitus with other specified complication: Secondary | ICD-10-CM | POA: Diagnosis not present

## 2018-12-01 DIAGNOSIS — Z79899 Other long term (current) drug therapy: Secondary | ICD-10-CM | POA: Diagnosis not present

## 2018-12-01 MED ORDER — LISINOPRIL 10 MG PO TABS
10.0000 mg | ORAL_TABLET | Freq: Once | ORAL | Status: AC
  Administered 2018-12-01: 10 mg via ORAL
  Filled 2018-12-01: qty 1

## 2018-12-01 NOTE — ED Notes (Signed)
Pt denies CP or SHOB at this time.  Denies headache or blurred vision.  Pt reports "feeling much better, I took my Lisinopril late today, around 4 this afternoon."

## 2018-12-01 NOTE — ED Triage Notes (Signed)
Per EMS, patient from home, c/o headache since 1500. Hypertensive with EMS. Hx of same. BP 190/100. Recent BP med change. Reports increased stressors.   BP 147/86 in triage.  BP 192/108 HR 58 RR 16 O2 97% CBG 115 T 98.6

## 2018-12-01 NOTE — Telephone Encounter (Signed)
[facility] calls and states pt called them for a h/a. They state her BP is 190/100, have not repeated and this was on arrival. They state her BP med was recently decreased by ED. Pt denies any/all other symptoms when ask- chest, pain, short of breath. They state all ed's are full at this time and this is not emergent so pt would have a long wait. [facility] 11/11 at 0945. Pt ask about increasing her medication and was advised that md would be notified and it would be discussed at appt but nurse cannot advise she was agreeable

## 2018-12-01 NOTE — Discharge Instructions (Signed)
You were given an extra 10 mg of your blood pressure medications this evening.  Continue to monitor your blood pressure.  Follow-up with your primary care doctor to discuss continued management of your blood pressure.

## 2018-12-01 NOTE — Telephone Encounter (Signed)
I read her discharge summary and recommend that she continue current doses and come to the clinic tomorrow for further evaluation and treatment.  Thank you

## 2018-12-01 NOTE — ED Provider Notes (Signed)
[facility] DEPT Provider Note   CSN: [id number] Arrival date & time: 12/01/18  1630     History   Chief Complaint Chief Complaint  Patient presents with  . Headache    HPI [name] is a 53 y.o. female.     HPI Pt was in the hospital recently for low blood pressure.  She had her medications changed.  Pt forgot to take her lisinopril today until 4pm.  She took 10 mg.  She called EMS when she measured her blood pressure and it was 180s/100.  She had a headache and took her lisinopril after talking to EMS.  She is feeling better now.  No headache now.  No cpr or sob.  No numbness or weakness. Past Medical History:  Diagnosis Date  . Anemia    when in late teens  . Anxiety   . Breast abscess   . Chlamydia   . Depression    PTSD also  . Diabetes mellitus without complication ([place])   . Foot pain, bilateral    "burning"  . Gonorrhea   . High cholesterol   . Hypercholesteremia   . Hypertension   . Recurrent upper respiratory infection (URI)    URI/SOB-treated with antibiotics-cleared up now  . Seizures ([place])   . Substance abuse ([place])    alcohol  . Tuberculosis    was treated    Patient Active Problem List   Diagnosis Date Noted  . Hypotension 11/14/2018  . Hyponatremia 11/13/2018  . Normocytic anemia 11/13/2018  . Alcohol abuse   . Grief 12/25/2017  . Orthostatic hypotension 12/22/2017  . Healthcare maintenance 11/12/2016  . Occipital neuralgia of left side 06/21/2016  . Dysuria 05/21/2016  . Avascular necrosis ([place]) 03/01/2016  . Tobacco use disorder 04/12/2015  . Abnormal brain MRI 03/10/2015  . Elevated liver enzymes 04/14/2013  . 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 Alcohol dependence ([place]) 03/23/2013  . Substance induced mood disorder ([place]) 03/22/2013  . Depression 02/05/2012  . Hyperlipidemia 02/05/2012  . Hypertension 01/07/2012  . Type 2 diabetes mellitus with hyperlipidemia ([place]) 01/07/2012    Past Surgical History:  Procedure Laterality Date  . BREAST BIOPSY  11/13/2010  . BREAST LUMPECTOMY    . INCISE AND DRAIN ABCESS  01/03/11   breast abscess  . TONGUE SURGERY  2008   lump removed     OB History    Gravida  2   Para  2   Term  2   Preterm      AB      Living  2     SAB      TAB      Ectopic      Multiple      Live Births               Home Medications    Prior to Admission medications   Medication Sig Start Date End Date Taking? Authorizing Provider  atorvastatin (LIPITOR) 40 MG tablet Take 1 tablet (40 mg total) by mouth daily for 30 days. IM Program. 04/24/18 12/01/18 Yes Bloomfield, Carley D, DO  calcium carbonate (OS-CAL - DOSED IN MG OF ELEMENTAL CALCIUM) 1250 (500 Ca) MG tablet Take 1 tablet by mouth daily with breakfast.   Yes [provider]  carvedilol (COREG) 6.25 MG tablet Take 1 tablet (6.25 mg total) by mouth 2 (two) times daily. 07/01/18 12/28/18 Yes Axel Filler, MD  folic acid (FOLVITE) 1 MG tablet Take 1  tablet (1 mg total) by mouth daily. 11/14/18 11/14/19 Yes Burnadette PopAdhikari, Amrit, MD  ibuprofen (ADVIL) 200 MG tablet Take 400-600 mg by mouth every 6 (six) hours as needed for headache.   Yes [provider]  lisinopril (ZESTRIL) 10 MG tablet Take 1 tablet (10 mg total) by mouth daily. 11/14/18  Yes Burnadette PopAdhikari, Amrit, MD  Magnesium Oxide 400 MG CAPS Take 1 capsule (400 mg total) by mouth daily. 11/14/18  Yes Burnadette PopAdhikari, Amrit, MD  metFORMIN (GLUCOPHAGE) 500 MG tablet Take 1 tablet (500 mg total) by mouth 2 (two) times daily with a meal. 11/14/18  Yes Adhikari, Amrit, MD  sertraline (ZOLOFT) 50 MG tablet Take 1 tablet (50 mg total) by mouth daily. 05/24/18  Yes Masoudi, Elhamalsadat, MD  thiamine (VITAMIN B-1) 100 MG tablet Take 1 tablet (100 mg total) by mouth daily. 11/14/18  Yes Burnadette PopAdhikari, Amrit, MD    Family History Family History  Problem Relation Age of Onset  . Breast cancer Mother   . Breast cancer Sister    . Breast cancer Maternal Aunt     Social History Social History   Tobacco Use  . Smoking status: Current Every Day Smoker    Packs/day: 1.00    Types: Cigarettes    Last attempt to quit: 12/11/2012    Years since quitting: 5.9  . Smokeless tobacco: Never Used  . Tobacco comment: 1 pack ever 3 days  Substance Use Topics  . Alcohol use: Yes    Comment: Beer on weekends  . Drug use: No     Allergies   Patient has no known allergies.   Review of Systems Review of Systems  All other systems reviewed and are negative.    Physical Exam Updated Vital Signs BP (!) 170/86   Pulse (!) 55   Temp 99.3 F (37.4 C) (Oral)   Resp 16   LMP 01/22/2013   SpO2 100%   Physical Exam Vitals signs and nursing note reviewed.  Constitutional:      General: She is not in acute distress.    Appearance: She is well-developed.  HENT:     Head: Normocephalic and atraumatic.     Right Ear: External ear normal.     Left Ear: External ear normal.  Eyes:     General: No scleral icterus.       Right eye: No discharge.        Left eye: No discharge.     Conjunctiva/sclera: Conjunctivae normal.  Neck:     Musculoskeletal: Neck supple.     Trachea: No tracheal deviation.  Cardiovascular:     Rate and Rhythm: Normal rate and regular rhythm.  Pulmonary:     Effort: Pulmonary effort is normal. No respiratory distress.     Breath sounds: Normal breath sounds. No stridor. No wheezing or rales.  Abdominal:     General: Bowel sounds are normal. There is no distension.     Palpations: Abdomen is soft.     Tenderness: There is no abdominal tenderness. There is no guarding or rebound.  Musculoskeletal:        General: No tenderness.  Skin:    General: Skin is warm and dry.     Findings: No rash.  Neurological:     Mental Status: She is alert and oriented to person, place, and time.     Cranial Nerves: No cranial nerve deficit (No facial droop, extraocular movements intact, tongue midline  ).     Sensory: No sensory deficit.  Motor: No abnormal muscle tone or seizure activity.     Coordination: Coordination normal.     Comments: No pronator drift bilateral upper extrem, able to hold both legs off bed for 5 seconds, sensation intact in all extremities, no visual field cuts, no left or right sided neglect, normal finger-nose exam bilaterally, no nystagmus noted       ED Treatments / Results  Labs (all labs ordered are listed, but only abnormal results are displayed) Labs Reviewed - No data to display  EKG None  Radiology No results found.  Procedures Procedures (including critical care time)  Medications Ordered in ED Medications  lisinopril (ZESTRIL) tablet 10 mg (has no administration in time range)     Initial Impression / Assessment and Plan / ED Course  I have reviewed the triage vital signs and the nursing notes.  Pertinent labs & imaging results that were available during my care of the patient were reviewed by me and considered in my medical decision making (see chart for details).  Clinical Course as of Nov 30 1925  Tue Dec 01, 2018  1925 BP has increased to 170/86.   [JK]    Clinical Course User Index [JK] Linwood Dibbles, MD     Patient presented to ED for evaluation of headache and hypertension.  Patient was recently in the hospital with hypotension associated with vomiting and diarrhea.  Patient's blood pressure medications were decreased on discharge.  Patient has been otherwise feeling fine now.  She is eating and drinking normally.  Patient's initially forgot to take her lisinopril dose.  She did take it before coming to the ED.  Her blood pressure did decrease but now is in the 170s.  I think it is reasonable for her to take another 10 mg of her lisinopril this evening.  I will have her contact her doctor to continue to manage her blood pressure as an outpatient.  Fortunately no signs of any acute complications.  No acute neuro deficits.  No  findings to suggest stroke TIA or other acute vascular complication.  Final Clinical Impressions(s) / ED Diagnoses   Final diagnoses:  Hypertension, unspecified type    ED Discharge Orders    None       Linwood Dibbles, MD 12/01/18 Serena Croissant

## 2018-12-02 ENCOUNTER — Ambulatory Visit

## 2018-12-02 ENCOUNTER — Encounter: Payer: Self-pay | Admitting: Internal Medicine

## 2018-12-03 ENCOUNTER — Telehealth: Payer: Self-pay | Admitting: *Deleted

## 2018-12-03 ENCOUNTER — Other Ambulatory Visit: Payer: Self-pay | Admitting: Internal Medicine

## 2018-12-03 DIAGNOSIS — R809 Proteinuria, unspecified: Secondary | ICD-10-CM

## 2018-12-03 DIAGNOSIS — E1129 Type 2 diabetes mellitus with other diabetic kidney complication: Secondary | ICD-10-CM

## 2018-12-03 NOTE — Telephone Encounter (Signed)
Patient called in concerned that her BPs this AM have been 114/69, 100/62, and 104/66 P 70. Denies dizziness, lightheadedness. Patient with recent hospitalization for hypotension and lisinopril reduced to 10 mg daily. Today was the first day she resumed taking lisinopril 40 mg. Has appt with PCP 12/07/2018. Patient reassured that these BPs are within normal range. Discussed transitioning slowly from lying/reclining to sitting before standing to prevent lightheadedness. Will route to PCP for any med changes prior to Monday's appt. Hubbard Hartshorn, BSN, RN-BC

## 2018-12-03 NOTE — Telephone Encounter (Signed)
I agree, if she's not having any symptoms I'm ok with those readings. She could also keep taking the 10 mg dose until her appointment if she prefers. If she does develop symptoms, I'd have her stop it.

## 2018-12-03 NOTE — Telephone Encounter (Signed)
Patient notified of below. She will continue taking the lisinopril 10 mg daily till she meets with PCP on 12/07/2018. Hubbard Hartshorn, BSN, RN-BC

## 2018-12-07 ENCOUNTER — Ambulatory Visit: Admitting: Internal Medicine

## 2018-12-07 ENCOUNTER — Encounter: Payer: Self-pay | Admitting: Internal Medicine

## 2018-12-07 VITALS — BP 174/93 | HR 68 | Temp 99.1°F | Wt 163.5 lb

## 2018-12-07 DIAGNOSIS — F320 Major depressive disorder, single episode, mild: Secondary | ICD-10-CM

## 2018-12-07 DIAGNOSIS — F329 Major depressive disorder, single episode, unspecified: Secondary | ICD-10-CM

## 2018-12-07 DIAGNOSIS — E119 Type 2 diabetes mellitus without complications: Secondary | ICD-10-CM

## 2018-12-07 DIAGNOSIS — F4321 Adjustment disorder with depressed mood: Secondary | ICD-10-CM

## 2018-12-07 DIAGNOSIS — E871 Hypo-osmolality and hyponatremia: Secondary | ICD-10-CM | POA: Diagnosis not present

## 2018-12-07 DIAGNOSIS — F1010 Alcohol abuse, uncomplicated: Secondary | ICD-10-CM

## 2018-12-07 DIAGNOSIS — I10 Essential (primary) hypertension: Secondary | ICD-10-CM | POA: Diagnosis not present

## 2018-12-07 DIAGNOSIS — Z79899 Other long term (current) drug therapy: Secondary | ICD-10-CM

## 2018-12-07 DIAGNOSIS — F17210 Nicotine dependence, cigarettes, uncomplicated: Secondary | ICD-10-CM

## 2018-12-07 NOTE — Patient Instructions (Addendum)
[name], It was nice seeing you today.   For your blood pressure, I'd like you to come back in about 2 weeks to re-check after you've consistently been taking the Lisinopril 40 mg daily and Coreg. Please bring your blood pressure cuff from home to compare again.   I am checking some lab work to follow-up from your hospital visit.    I am sorry you are having a hard time with the grieving process over the loss of your husband. This can be a very difficult thing to cope with. I will refer you to our counselor who can do visits over the phone to see if this helps you.   Take care, Dr. Koleen Distance

## 2018-12-08 LAB — BMP8+ANION GAP
Anion Gap: 16 mmol/L (ref 10.0–18.0)
BUN/Creatinine Ratio: 18 (ref 9–23)
BUN: 11 mg/dL (ref 6–24)
CO2: 22 mmol/L (ref 20–29)
Calcium: 10 mg/dL (ref 8.7–10.2)
Chloride: 93 mmol/L — ABNORMAL LOW (ref 96–106)
Creatinine, Ser: 0.62 mg/dL (ref 0.57–1.00)
GFR calc Af Amer: 120 mL/min/{1.73_m2} (ref >59)
GFR calc non Af Amer: 104 mL/min/{1.73_m2} (ref >59)
Glucose: 89 mg/dL (ref 65–99)
Potassium: 4.9 mmol/L (ref 3.5–5.2)
Sodium: 131 mmol/L — ABNORMAL LOW (ref 134–144)

## 2018-12-08 LAB — MAGNESIUM: Magnesium: 1.6 mg/dL (ref 1.6–2.3)

## 2018-12-09 ENCOUNTER — Telehealth: Payer: Self-pay | Admitting: Licensed Clinical Social Worker

## 2018-12-09 ENCOUNTER — Encounter: Payer: Self-pay | Admitting: Licensed Clinical Social Worker

## 2018-12-09 NOTE — Telephone Encounter (Signed)
Patient was called. No answer, and no vm was available. A letter will also be mailed to try to establish care.

## 2018-12-12 NOTE — Progress Notes (Signed)
CC: hospital follow-up, HTN, DM   HPI:  [name] is a 53 y.o. female with PMHx listed below who presents for follow-up on HTN, DM, and recent hospitalization.   Patient had recent overnight admission on 10/23-10/24 for acute on chronic hyponatremia secondary to decreased PO intake and excessive alcohol use, as well as orthostatic hypotension. She was counseled on alcohol cessation, given 7 days' worth of salt tabs, and was instructed to decrease Lisinopril to 10 mg.   Today she is concerned that her blood pressure has been high the last few days, but has been asymptomatic. She increased her Lisinopril back to 40 mg as of yesterday.   Regarding her alcohol use, she states she has not had anything to drink since her hospitalization. Admits to using this to cope with continuing to grieve over loss of her husband.   Past Medical History:  Diagnosis Date  . Anemia    when in late teens  . Anxiety   . Breast abscess   . Chlamydia   . Depression    PTSD also  . Diabetes mellitus without complication ([facility])   . Foot pain, bilateral    "burning"  . Gonorrhea   . High cholesterol   . Hypercholesteremia   . Hypertension   . Recurrent upper respiratory infection (URI)    URI/SOB-treated with antibiotics-cleared up now  . Seizures ([facility])   . Substance abuse ([facility])    alcohol  . Tuberculosis    was treated   Review of Systems:  Review of Systems  Constitutional: Negative for chills, fever and malaise/fatigue.  HENT: Negative for tinnitus.   Eyes: Negative for blurred vision and double vision.  Respiratory: Negative for shortness of breath.   Cardiovascular: Negative for chest pain, palpitations and leg swelling.  Gastrointestinal: Negative for nausea and vomiting.  Genitourinary: Negative for dysuria.  Musculoskeletal: Negative for falls.  Neurological: Negative for dizziness, sensory change and headaches.  Psychiatric/Behavioral: Positive for depression and substance  abuse. Negative for suicidal ideas.    Physical Exam:  Vitals:   12/07/18 1507 12/07/18 1536  BP: (!) 183/89 (!) 174/93  Pulse: 72 68  Temp: 99.1 F (37.3 C)   TempSrc: Oral   SpO2: 98%   Weight: 163 lb 8 oz (74.2 kg)    Physical Exam Constitutional:      General: She is not in acute distress.    Appearance: Normal appearance.  Eyes:     General: No scleral icterus.    Extraocular Movements: Extraocular movements intact.     Conjunctiva/sclera: Conjunctivae normal.  Neck:     Musculoskeletal: Normal range of motion and neck supple.  Cardiovascular:     Rate and Rhythm: Normal rate and regular rhythm.  Pulmonary:     Effort: Pulmonary effort is normal.     Breath sounds: Normal breath sounds.  Abdominal:     General: There is no distension.     Palpations: Abdomen is soft.     Tenderness: There is no abdominal tenderness.  Musculoskeletal:        General: No swelling.  Skin:    General: Skin is warm and dry.  Neurological:     General: No focal deficit present.     Mental Status: She is alert and oriented to person, place, and time.  Psychiatric:        Mood and Affect: Mood is depressed. Affect is flat.     Assessment & Plan:   See Encounters Tab for problem  based charting.  Patient discussed with Dr. Dareen Piano

## 2018-12-13 ENCOUNTER — Encounter: Payer: Self-pay | Admitting: Internal Medicine

## 2018-12-13 NOTE — Assessment & Plan Note (Signed)
BP Readings from Last 3 Encounters:  12/07/18 (!) 174/93  12/01/18 (!) 161/102  11/14/18 108/65   Patient's blood pressure has been elevated based on home readings, as well as here in the office. Likely combination of decreased Lisinopril and prescribed salt tabs for hyponatremia. Will continue her Lisinopril 40 mg for another 2 weeks and bring back for re-check. Hesitant to add anything at this time due to her multiple admissions for orthostatic hypotension. She is not having any concerning symptoms of elevated blood pressure at this time.

## 2018-12-13 NOTE — Assessment & Plan Note (Signed)
Patient's sodium improved from 126>131 since discharge on 10/24. Etiology felt to be primarily related to excessive alcohol consumption and decreased solute intake as a result. Encouraged her to continue with alcohol cessation. Repeat BMP in 2 weeks at follow-up visit to ensure stability.

## 2018-12-13 NOTE — Assessment & Plan Note (Signed)
Patient admits to struggling with depression, tearfulness, anhedonia as she continues to grieve over the loss of her husband. She has a tendency to relapse into drinking and also admits to smoking more cigarettes. She is hesitant to come in for face-to-face counseling visits, but is willing to have [facility] call her for telehealth visits. Will place referral today.

## 2018-12-14 NOTE — Progress Notes (Signed)
Internal Medicine Clinic Attending  Case discussed with Dr. Bloomfield at the time of the visit.  We reviewed the resident's history and exam and pertinent patient test results.  I agree with the assessment, diagnosis, and plan of care documented in the resident's note.  

## 2019-01-07 ENCOUNTER — Ambulatory Visit: Admitting: Licensed Clinical Social Worker

## 2019-01-25 ENCOUNTER — Encounter: Payer: Self-pay | Admitting: Internal Medicine

## 2019-01-25 ENCOUNTER — Other Ambulatory Visit (HOSPITAL_COMMUNITY): Payer: Self-pay | Admitting: Internal Medicine

## 2019-01-25 ENCOUNTER — Ambulatory Visit
Admission: RE | Admit: 2019-01-25 | Discharge: 2019-01-25 | Disposition: A | Discharge status: Home / Self Care | Payer: Self-pay | Source: Ambulatory Visit | Attending: Physician Assistant | Admitting: Physician Assistant

## 2019-01-25 ENCOUNTER — Encounter: Admitting: Internal Medicine

## 2019-01-25 DIAGNOSIS — R52 Pain, unspecified: Secondary | ICD-10-CM

## 2019-02-02 ENCOUNTER — Ambulatory Visit: Admitting: Licensed Clinical Social Worker

## 2019-02-03 NOTE — Addendum Note (Signed)
Addended by: Neomia Dear on: 02/03/2019 06:23 PM   Modules accepted: Orders

## 2019-02-19 ENCOUNTER — Other Ambulatory Visit: Payer: Self-pay | Admitting: Student in an Organized Health Care Education/Training Program

## 2019-02-19 DIAGNOSIS — I10 Essential (primary) hypertension: Secondary | ICD-10-CM

## 2019-03-11 ENCOUNTER — Telehealth: Payer: Self-pay | Admitting: Internal Medicine

## 2019-03-11 DIAGNOSIS — I10 Essential (primary) hypertension: Secondary | ICD-10-CM

## 2019-03-11 MED ORDER — LISINOPRIL 10 MG PO TABS: 10.0000 mg | ORAL_TABLET | Freq: Every day | ORAL | 0 refills | Status: DC

## 2019-03-11 NOTE — Telephone Encounter (Signed)
   Reason for call:   I received a call from [name] at 2:51 PM indicating that she is out of her medication.   Pertinent Data:   The patient is currently taking lisonpril 10mg  qd which she ran out of 4 days ago.  Patient's blood pressure is currently 117/84  Per last note by pcp on 12/07/18 she was only taking lisinopril  States that she may not be able to afford the information but will ask her neighbor for a loan.    Assessment / Plan / Recommendations:   Sent lisinopril refills  Told patient to callback imc if she has issues getting or affording medication   As always, pt is advised that if symptoms worsen or new symptoms arise, they should go to an urgent care facility or to to ER for further evaluation.   12/09/18, MD   03/11/2019, 2:51 PM

## 2019-04-29 ENCOUNTER — Other Ambulatory Visit: Payer: Self-pay | Admitting: Internal Medicine

## 2019-04-29 DIAGNOSIS — E1129 Type 2 diabetes mellitus with other diabetic kidney complication: Secondary | ICD-10-CM

## 2019-04-29 DIAGNOSIS — R809 Proteinuria, unspecified: Secondary | ICD-10-CM

## 2019-04-29 DIAGNOSIS — I10 Essential (primary) hypertension: Secondary | ICD-10-CM

## 2019-06-24 ENCOUNTER — Emergency Department (HOSPITAL_COMMUNITY)
Admission: EM | Admit: 2019-06-24 | Discharge: 2019-06-25 | Discharge status: Against Medical Advice | Attending: Emergency Medicine | Admitting: Emergency Medicine

## 2019-06-24 DIAGNOSIS — Z5321 Procedure and treatment not carried out due to patient leaving prior to being seen by health care provider: Secondary | ICD-10-CM | POA: Diagnosis not present

## 2019-06-24 DIAGNOSIS — R55 Syncope and collapse: Secondary | ICD-10-CM | POA: Insufficient documentation

## 2019-06-25 ENCOUNTER — Encounter (HOSPITAL_COMMUNITY): Payer: Self-pay

## 2019-06-25 ENCOUNTER — Other Ambulatory Visit: Payer: Self-pay

## 2019-06-25 LAB — CBG MONITORING, ED: Glucose-Capillary: 109 mg/dL — ABNORMAL HIGH (ref 70–99)

## 2019-06-25 MED ORDER — THIAMINE HCL 100 MG/ML IJ SOLN
Freq: Once | INTRAVENOUS | Status: DC
  Filled 2019-06-25: qty 1000

## 2019-06-25 NOTE — ED Notes (Signed)
Pt alert and oriented x4. Pt advises she did not even want EMS to transport her and she does not want treatment and transport. Pt verbalized understanding of risks without being evaluated and treated patient eloped from facility.

## 2019-06-25 NOTE — ED Triage Notes (Signed)
Pt arrives via [facility] from [facility] c/o hypotension and a syncopal episode. EMS advises pt found initially unresponsive with BP 70/40. EMS admin 500 mL NS and pt became more aroused. On arrival to ED, pt is alert and oriented x4, GCS 15. BP 127/60, HR 72, SPO2 98% RA, RR 14, CBG 112. Pt advises two episodes of vomiting earlier today, admits to ETOH use and PMHX of seizures.

## 2019-08-23 ENCOUNTER — Other Ambulatory Visit: Payer: Self-pay | Admitting: Internal Medicine

## 2019-08-23 DIAGNOSIS — I10 Essential (primary) hypertension: Secondary | ICD-10-CM

## 2019-08-23 DIAGNOSIS — E1129 Type 2 diabetes mellitus with other diabetic kidney complication: Secondary | ICD-10-CM

## 2019-08-23 NOTE — Telephone Encounter (Signed)
Need refill on  metFORMIN (GLUCOPHAGE) 500 MG tablet  atorvastatin (LIPITOR) 40 MG tablet  lisinopril (ZESTRIL) 10 MG tablet  carvedilol (COREG) 6.25 MG tablet ;pt contact [phone]  [id number] - [place] ([place]), [place] - [address]

## 2019-08-27 ENCOUNTER — Encounter: Admitting: Internal Medicine

## 2019-08-27 NOTE — Progress Notes (Deleted)
   CC: ***Follow-up of diabetes  HPI:  [name] is a 54 y.o. female with PMHx as documented below, presented for DM follow-up. please refer to problem based charting for further details and assessment and plan of current problem and chronic medical conditions.   PMHx: HTN, orthostatic hypotension, HLD, DM 2, alcohol abuse,Substance induced mood disorder  Medications: Lipitor 40, calcium carbonate, Coreg 6.25 mg twice daily, folic acid, lisinopril 10 daily, Metformin 500 mg twice daily, sertraline, thiamine   Past Medical History:  Diagnosis Date  . Anemia    when in late teens  . Anxiety   . Breast abscess   . Chlamydia   . Depression    PTSD also  . Diabetes mellitus without complication ([facility])   . Foot pain, bilateral    "burning"  . Gonorrhea   . High cholesterol   . Hypercholesteremia   . Hypertension   . Recurrent upper respiratory infection (URI)    URI/SOB-treated with antibiotics-cleared up now  . Seizures ([facility])   . Substance abuse ([facility])    alcohol  . Tuberculosis    was treated   Review of Systems:  ***  Physical Exam:  There were no vitals filed for this visit. *** Constitutional: ***Well-developed and well-nourished. No acute distress.  HENT:  Head: Normocephalic and atraumatic.  Eyes: ***Conjunctivae are normal, EOM nl Cardiovascular: *** RRR, nl S1S2, ***no murmur, *** no LEE Respiratory: ***Effort normal and breath sounds normal. No respiratory distress. No wheezes.  GI: ***Soft. Bowel sounds are normal. No distension. There is no tenderness.  Neurological: Is alert and oriented x 3 *** Skin: Not diaphoretic. No erythema.  Psychiatric: *** Normal mood and affect. Behavior is normal. Judgment and thought content normal.   Assessment & Plan:   See Encounters Tab for problem based charting.  Patient discussed with Dr. {NAMES:[id number]::"Butcher","Guilloud","Hoffman","Mullen","Narendra","Raines","Vincent"}  HTN:  BP has been  elevated on previous visits but hesitated to add to his antihypertensive regimen due to history of several admissions due to orthostatic hypotension and also hypotension.  BP today is*** .   Medications list shows  She is on lisinopril 10 mg QD but per last assessment and plan, he was supposed to be on Lisinopril 40 mg QD. She is also taking Coreg 6.25 mg BID.  Thank you for allowing [place] to provide your care today. Today we discussed ***   None I have ordered *** labs for you. I will call if any are abnormal.     Today we made *** changes to your medications.    Please come back to clinic in **** or earlier if your symptoms get worse or not improved. As always, if having severe symptoms, please seek medical attention at emergency room. Should you have any questions or concerns please call the internal medicine clinic at [phone].    Thank you!

## 2019-08-28 ENCOUNTER — Other Ambulatory Visit: Payer: Self-pay | Admitting: Internal Medicine

## 2019-08-28 DIAGNOSIS — R809 Proteinuria, unspecified: Secondary | ICD-10-CM

## 2019-08-28 DIAGNOSIS — I10 Essential (primary) hypertension: Secondary | ICD-10-CM

## 2019-08-30 MED ORDER — ATORVASTATIN CALCIUM 40 MG PO TABS: 40.0000 mg | ORAL_TABLET | Freq: Every day | ORAL | 0 refills | Status: DC

## 2019-08-30 MED ORDER — METFORMIN HCL 500 MG PO TABS: 500.0000 mg | ORAL_TABLET | Freq: Two times a day (BID) | ORAL | 0 refills | Status: DC

## 2019-08-30 MED ORDER — LISINOPRIL 10 MG PO TABS: 10.0000 mg | ORAL_TABLET | Freq: Every day | ORAL | 0 refills | Status: DC

## 2019-08-30 MED ORDER — CARVEDILOL 6.25 MG PO TABS: 6.2500 mg | ORAL_TABLET | Freq: Two times a day (BID) | ORAL | 0 refills | Status: DC

## 2019-08-30 MED ORDER — SERTRALINE HCL 50 MG PO TABS: 50.0000 mg | ORAL_TABLET | Freq: Every day | ORAL | 0 refills | Status: AC

## 2019-09-02 NOTE — Addendum Note (Signed)
Addended by: Fredderick Severance on: 09/02/2019 02:31 PM   Modules accepted: Orders

## 2019-09-03 MED ORDER — ATORVASTATIN CALCIUM 40 MG PO TABS: 40.0000 mg | ORAL_TABLET | Freq: Every day | ORAL | 0 refills | Status: AC

## 2019-09-03 MED ORDER — METFORMIN HCL 500 MG PO TABS: 500.0000 mg | ORAL_TABLET | Freq: Two times a day (BID) | ORAL | 0 refills | Status: DC

## 2020-03-15 ENCOUNTER — Encounter: Admitting: Internal Medicine

## 2020-03-17 ENCOUNTER — Encounter: Admitting: Internal Medicine

## 2020-03-17 NOTE — Progress Notes (Deleted)
Established Patient Office Visit  Subjective:  Patient ID: [name], female    DOB: [birth year]  Age: 55 y.o. MRN: [id number]  CC: No chief complaint on file.   HPI [name] presents for ***. Please refer to problem based charting for further details and assessment and plan of current problem and chronic medical conditions.   PMHx: HTN, orthostatic hypotension, type II DM, HLD, alcohol use disorder, normocytic anemia, depression Medications: Lipitor, Calcium gluconate, lisinopril, Mg, Metformin, Sertraline, Thiamin Carvedilol  05/22/2018 pulm nodule: Solitary 5 mm right lower lobe pulmonary nodule, stable since 12/22/2017 chest CT, probably benign. No follow-up needed if patient is low-risk. Non-contrast chest CT can be considered in 12 months if patient is high-risk. This recommendation follows the consensus statement: Guidelines for Management of Incidental Pulmonary Nodules Detected on CT Images:From the Fleischner Society 2017; published online before print (10.1148/radiol.7209470962). 4. One vessel coronary atherosclerosis. Active Ambulatory Problems    Diagnosis Date Noted  . Hypertension 01/07/2012  . Type 2 diabetes mellitus with hyperlipidemia (HCC) 01/07/2012  . Depression 02/05/2012  . Hyperlipidemia 02/05/2012  . Substance induced mood disorder (HCC) 03/22/2013  . Alcohol dependence (HCC) 03/23/2013  . Elevated liver enzymes 04/14/2013  . Diabetic neuropathy, painful (HCC) 04/14/2013  . Abnormal brain MRI 03/10/2015  . Tobacco use disorder 04/12/2015  . Avascular necrosis (HCC) 03/01/2016  . Dysuria 05/21/2016  . Occipital neuralgia of left side 06/21/2016  . Healthcare maintenance 11/12/2016  . Orthostatic hypotension 12/22/2017  . Grief 12/25/2017  . Hyponatremia 11/13/2018  . Alcohol abuse   . Normocytic anemia 11/13/2018  . Hypotension 11/14/2018   Resolved Ambulatory Problems    Diagnosis Date Noted  . Left breast abscess  12/12/2010  . Breast pain, left 10/07/2011  . History of treatment for tuberculosis 01/07/2012  . Hematoma 08/14/2012  . Foul smelling vaginal discharge 04/14/2013  . Acute encephalopathy 08/13/2013  . Transaminitis 03/10/2015  . Osteoarthritis of left hip 03/10/2015  . Strain of left hip adductor muscle 05/30/2015  . Hyponatremia 08/29/2015  . Acute URI 08/29/2015  . Near syncope 12/22/2017  . AKI (acute kidney injury) (HCC) 12/22/2017  . Postural dizziness with presyncope 12/22/2017  . Vasovagal syncope 05/22/2018   Past Medical History:  Diagnosis Date  . Anemia   . Anxiety   . Breast abscess   . Chlamydia   . Diabetes mellitus without complication (HCC)   . Foot pain, bilateral   . Gonorrhea   . High cholesterol   . Hypercholesteremia   . Recurrent upper respiratory infection (URI)   . Seizures (HCC)   . Substance abuse (HCC)   . Tuberculosis    medical Past Medical History:  Diagnosis Date  . Anemia    when in late teens  . Anxiety   . Breast abscess   . Chlamydia   . Depression    PTSD also  . Diabetes mellitus without complication (HCC)   . Foot pain, bilateral    "burning"  . Gonorrhea   . High cholesterol   . Hypercholesteremia   . Hypertension   . Recurrent upper respiratory infection (URI)    URI/SOB-treated with antibiotics-cleared up now  . Seizures (HCC)   . Substance abuse (HCC)    alcohol  . Tuberculosis    was treated    Past Surgical History:  Procedure Laterality Date  . BREAST BIOPSY  11/13/2010  . BREAST LUMPECTOMY    . INCISE AND DRAIN ABCESS  01/03/11   breast abscess  .  TONGUE SURGERY  2008   lump removed    Family History  Problem Relation Age of Onset  . Breast cancer Mother   . Breast cancer Sister   . Breast cancer Maternal Aunt     Social History   Socioeconomic History  . Marital status: Married    Spouse name: Not on file  . Number of children: Not on file  . Years of education: GED 81  . Highest  education level: Not on file  Occupational History  . Occupation: unemployed  Tobacco Use  . Smoking status: Current Every Day Smoker    Packs/day: 1.00    Types: Cigarettes    Last attempt to quit: 12/11/2012    Years since quitting: 7.2  . Smokeless tobacco: Never Used  . Tobacco comment: 1 pack ever 3 days  Substance and Sexual Activity  . Alcohol use: Yes    Comment: Beer on weekends  . Drug use: No  . Sexual activity: Not on file  Other Topics Concern  . Not on file  Social History Narrative  . Not on file   Social Determinants of Health   Financial Resource Strain: Not on file  Food Insecurity: Not on file  Transportation Needs: Not on file  Physical Activity: Not on file  Stress: Not on file  Social Connections: Not on file  Intimate Partner Violence: Not on file    Outpatient Medications Prior to Visit  Medication Sig Dispense Refill  . atorvastatin (LIPITOR) 40 MG tablet Take 1 tablet (40 mg total) by mouth daily. 90 tablet 0  . calcium carbonate (OS-CAL - DOSED IN MG OF ELEMENTAL CALCIUM) 1250 (500 Ca) MG tablet Take 1 tablet by mouth daily with breakfast.    . carvedilol (COREG) 6.25 MG tablet Take 1 tablet (6.25 mg total) by mouth 2 (two) times daily. 180 tablet 0  . ibuprofen (ADVIL) 200 MG tablet Take 400-600 mg by mouth every 6 (six) hours as needed for headache.    . lisinopril (ZESTRIL) 10 MG tablet Take 1 tablet (10 mg total) by mouth daily. 90 tablet 0  . Magnesium Oxide 400 MG CAPS Take 1 capsule (400 mg total) by mouth daily. 30 capsule 0  . metFORMIN (GLUCOPHAGE) 500 MG tablet Take 1 tablet (500 mg total) by mouth 2 (two) times daily with a meal. 180 tablet 0  . sertraline (ZOLOFT) 50 MG tablet Take 1 tablet (50 mg total) by mouth daily. 90 tablet 0  . thiamine (VITAMIN B-1) 100 MG tablet Take 1 tablet (100 mg total) by mouth daily. 30 tablet 0   No facility-administered medications prior to visit.    No Known Allergies  ROS Review of  Systems    Objective:    Physical Exam  LMP 01/22/2013  Wt Readings from Last 3 Encounters:  06/25/19 163 lb 2.3 oz (74 kg)  12/07/18 163 lb 8 oz (74.2 kg)  11/13/18 160 lb (72.6 kg)   Constitutional: ***Well-developed and well-nourished. No acute distress.  HENT:  Head: Normocephalic and atraumatic.  Eyes: ***Conjunctivae are normal, EOM nl Cardiovascular: *** RRR, nl S1S2, ***no murmur, *** no LEE Respiratory: ***Effort normal and breath sounds normal. No respiratory distress. No wheezes.  GI: ***Soft. Bowel sounds are normal. No distension. There is no tenderness.  Neurological: Is alert and oriented x 3 *** Skin: Not diaphoretic. No erythema.  Psychiatric: *** Normal mood and affect. Behavior is normal. Judgment and thought content normal.   Health Maintenance Due  Topic Date  Due  . COVID-19 Vaccine (1) Never done  . TETANUS/TDAP  Never done  . COLONOSCOPY (Pts 45-72yrs Insurance coverage will need to be confirmed)  Never done  . OPHTHALMOLOGY EXAM  08/13/2014  . PAP SMEAR-Modifier  11/09/2016  . LIPID PANEL  11/11/2017  . FOOT EXAM  07/10/2018  . HEMOGLOBIN A1C  02/13/2019  . INFLUENZA VACCINE  08/22/2019    There are no preventive care reminders to display for this patient.  Lab Results  Component Value Date   TSH 1.900 12/23/2017   Lab Results  Component Value Date   WBC 5.2 11/14/2018   HGB 9.4 (L) 11/14/2018   HCT 27.8 (L) 11/14/2018   MCV 85.8 11/14/2018   PLT 141 (L) 11/14/2018   Lab Results  Component Value Date   NA 131 (L) 12/07/2018   K 4.9 12/07/2018   CO2 22 12/07/2018   GLUCOSE 89 12/07/2018   BUN 11 12/07/2018   CREATININE 0.62 12/07/2018   BILITOT 0.5 11/14/2018   ALKPHOS 67 11/14/2018   AST 57 (H) 11/14/2018   ALT 70 (H) 11/14/2018   PROT 6.1 (L) 11/14/2018   ALBUMIN 3.2 (L) 11/14/2018   CALCIUM 10.0 12/07/2018   ANIONGAP 8 11/14/2018   Lab Results  Component Value Date   CHOL 213 (H) 11/11/2016   Lab Results  Component  Value Date   HDL 47 11/11/2016   Lab Results  Component Value Date   LDLCALC 135 (H) 11/11/2016   Lab Results  Component Value Date   TRIG 155 (H) 11/11/2016   Lab Results  Component Value Date   CHOLHDL 4.5 (H) 11/11/2016   Lab Results  Component Value Date   HGBA1C 6.2 (H) 11/13/2018      Assessment & Plan:   Problem List Items Addressed This Visit   None    *** No orders of the defined types were placed in this encounter.   Follow-up: No follow-ups on file.    Chevis Pretty, MD

## 2020-03-19 ENCOUNTER — Other Ambulatory Visit: Payer: Self-pay

## 2020-03-19 ENCOUNTER — Encounter (HOSPITAL_COMMUNITY): Payer: Self-pay

## 2020-03-19 ENCOUNTER — Emergency Department (HOSPITAL_COMMUNITY)
Admission: EM | Admit: 2020-03-19 | Discharge: 2020-03-19 | Disposition: A | Discharge status: Home / Self Care | Attending: Emergency Medicine | Admitting: Emergency Medicine

## 2020-03-19 DIAGNOSIS — Z8679 Personal history of other diseases of the circulatory system: Secondary | ICD-10-CM

## 2020-03-19 DIAGNOSIS — Z79899 Other long term (current) drug therapy: Secondary | ICD-10-CM | POA: Diagnosis not present

## 2020-03-19 DIAGNOSIS — Z8639 Personal history of other endocrine, nutritional and metabolic disease: Secondary | ICD-10-CM

## 2020-03-19 DIAGNOSIS — R519 Headache, unspecified: Secondary | ICD-10-CM | POA: Insufficient documentation

## 2020-03-19 DIAGNOSIS — F17210 Nicotine dependence, cigarettes, uncomplicated: Secondary | ICD-10-CM | POA: Insufficient documentation

## 2020-03-19 DIAGNOSIS — I10 Essential (primary) hypertension: Secondary | ICD-10-CM | POA: Diagnosis not present

## 2020-03-19 DIAGNOSIS — Z7984 Long term (current) use of oral hypoglycemic drugs: Secondary | ICD-10-CM | POA: Insufficient documentation

## 2020-03-19 DIAGNOSIS — E1140 Type 2 diabetes mellitus with diabetic neuropathy, unspecified: Secondary | ICD-10-CM | POA: Insufficient documentation

## 2020-03-19 LAB — CBG MONITORING, ED: Glucose-Capillary: 160 mg/dL — ABNORMAL HIGH (ref 70–99)

## 2020-03-19 NOTE — ED Triage Notes (Signed)
Patient c/o headache and numbness in right pinky finger. patient report not having her medication for 5 months. Patient report having 4 beers last night and developed headache this morning at 5 am.

## 2020-03-19 NOTE — ED Provider Notes (Signed)
[facility]-EMERGENCY DEPT Provider Note   CSN: [id number] Arrival date & time: 03/19/20  8299     History No chief complaint on file.   [name] is a 55 y.o. female.  Patient c/o dull frontal headache earlier around 5 am. States had drank a few beers last night. Denies any trauma or fall. No loc. Gradual onset dull, mild-mod frontal headache - now resolved. States also wanted to get blood pressure and blood sugar checked, as out of meds for several months, and has appt with pcp tomorrow to get them refilled.  No polyuria or polydipsia. Denies change in vision. No change in speech. No numbness, weakness, or loss or normal functional ability. No problems w gait, balance or coordination. Denies chest pain or sob. No leg swelling.   The history is provided by the patient and the EMS personnel.       Past Medical History:  Diagnosis Date  . Anemia    when in late teens  . Anxiety   . Breast abscess   . Chlamydia   . Depression    PTSD also  . Diabetes mellitus without complication (HCC)   . Foot pain, bilateral    "burning"  . Gonorrhea   . High cholesterol   . Hypercholesteremia   . Hypertension   . Recurrent upper respiratory infection (URI)    URI/SOB-treated with antibiotics-cleared up now  . Seizures (HCC)   . Substance abuse (HCC)    alcohol  . Tuberculosis    was treated    Patient Active Problem List   Diagnosis Date Noted  . Hypotension 11/14/2018  . Hyponatremia 11/13/2018  . Normocytic anemia 11/13/2018  . Alcohol abuse   . Grief 12/25/2017  . Orthostatic hypotension 12/22/2017  . Healthcare maintenance 11/12/2016  . Occipital neuralgia of left side 06/21/2016  . Dysuria 05/21/2016  . Avascular necrosis (HCC) 03/01/2016  . Tobacco use disorder 04/12/2015  . Abnormal brain MRI 03/10/2015  . Elevated liver enzymes 04/14/2013  . Diabetic neuropathy, painful (HCC) 04/14/2013  . Alcohol dependence (HCC) 03/23/2013  .  Substance induced mood disorder (HCC) 03/22/2013  . Depression 02/05/2012  . Hyperlipidemia 02/05/2012  . Hypertension 01/07/2012  . Type 2 diabetes mellitus with hyperlipidemia (HCC) 01/07/2012    Past Surgical History:  Procedure Laterality Date  . BREAST BIOPSY  11/13/2010  . BREAST LUMPECTOMY    . INCISE AND DRAIN ABCESS  01/03/11   breast abscess  . TONGUE SURGERY  2008   lump removed     OB History    Gravida  2   Para  2   Term  2   Preterm      AB      Living  2     SAB      IAB      Ectopic      Multiple      Live Births              Family History  Problem Relation Age of Onset  . Breast cancer Mother   . Breast cancer Sister   . Breast cancer Maternal Aunt     Social History   Tobacco Use  . Smoking status: Current Every Day Smoker    Packs/day: 1.00    Types: Cigarettes    Last attempt to quit: 12/11/2012    Years since quitting: 7.2  . Smokeless tobacco: Never Used  . Tobacco comment: 1 pack ever 3 days  Substance  Use Topics  . Alcohol use: Yes    Comment: Beer on weekends  . Drug use: No    Home Medications Prior to Admission medications   Medication Sig Start Date End Date Taking? Authorizing Provider  atorvastatin (LIPITOR) 40 MG tablet Take 1 tablet (40 mg total) by mouth daily. 09/03/19   Remo Lipps, MD  calcium carbonate (OS-CAL - DOSED IN MG OF ELEMENTAL CALCIUM) 1250 (500 Ca) MG tablet Take 1 tablet by mouth daily with breakfast.    [provider]  carvedilol (COREG) 6.25 MG tablet Take 1 tablet (6.25 mg total) by mouth 2 (two) times daily. 08/30/19   Masoudi, Shawna Orleans, MD  ibuprofen (ADVIL) 200 MG tablet Take 400-600 mg by mouth every 6 (six) hours as needed for headache.    [provider]  lisinopril (ZESTRIL) 10 MG tablet Take 1 tablet (10 mg total) by mouth daily. 08/30/19   Masoudi, Shawna Orleans, MD  Magnesium Oxide 400 MG CAPS Take 1 capsule (400 mg total) by mouth daily. 11/14/18    Burnadette Pop, MD  metFORMIN (GLUCOPHAGE) 500 MG tablet Take 1 tablet (500 mg total) by mouth 2 (two) times daily with a meal. 09/03/19   Remo Lipps, MD  sertraline (ZOLOFT) 50 MG tablet Take 1 tablet (50 mg total) by mouth daily. 08/30/19   Masoudi, Shawna Orleans, MD  thiamine (VITAMIN B-1) 100 MG tablet Take 1 tablet (100 mg total) by mouth daily. 11/14/18   Burnadette Pop, MD    Allergies    Patient has no known allergies.  Review of Systems   Review of Systems  Constitutional: Negative for chills and fever.  HENT: Negative for sore throat.   Eyes: Negative for pain, redness and visual disturbance.  Respiratory: Negative for cough and shortness of breath.   Cardiovascular: Negative for chest pain and leg swelling.  Gastrointestinal: Negative for abdominal pain, diarrhea, nausea and vomiting.  Endocrine: Negative for polydipsia and polyuria.  Genitourinary: Negative for flank pain.  Musculoskeletal: Negative for back pain, neck pain and neck stiffness.  Skin: Negative for rash.  Neurological: Positive for headaches. Negative for speech difficulty, weakness and numbness.  Hematological: Does not bruise/bleed easily.  Psychiatric/Behavioral: Negative for confusion.    Physical Exam Updated Vital Signs BP 133/88   Pulse 77   Temp 98 F (36.7 C) (Oral)   Resp 18   Ht 1.6 m (5\' 3" )   Wt 72.6 kg   LMP 01/22/2013   SpO2 100%   BMI 28.34 kg/m   Physical Exam Vitals and nursing note reviewed.  Constitutional:      Appearance: Normal appearance. She is well-developed.  HENT:     Head: Atraumatic.     Comments: No sinus or temporal tenderness.     Nose: Nose normal.     Mouth/Throat:     Mouth: Mucous membranes are moist.  Eyes:     General: No scleral icterus.    Extraocular Movements: Extraocular movements intact.     Conjunctiva/sclera: Conjunctivae normal.     Pupils: Pupils are equal, round, and reactive to light.  Neck:     Vascular: No carotid bruit.      Trachea: No tracheal deviation.  Cardiovascular:     Rate and Rhythm: Normal rate and regular rhythm.     Pulses: Normal pulses.     Heart sounds: Normal heart sounds. No murmur heard. No friction rub. No gallop.   Pulmonary:     Effort: Pulmonary effort is normal. No respiratory  distress.     Breath sounds: Normal breath sounds.  Abdominal:     General: Bowel sounds are normal. There is no distension.     Palpations: Abdomen is soft.     Tenderness: There is no abdominal tenderness.  Genitourinary:    Comments: No cva tenderness.  Musculoskeletal:        General: No swelling or tenderness.     Cervical back: Normal range of motion and neck supple. No rigidity or tenderness. No muscular tenderness.     Right lower leg: No edema.     Left lower leg: No edema.  Skin:    General: Skin is warm and dry.     Findings: No rash.  Neurological:     Mental Status: She is alert.     Comments: Alert, speech normal, no dysarthria or aphasia. Motor intact bil, stre 5/5. Sens grossly intact. Steady gait.   Psychiatric:        Mood and Affect: Mood normal.     ED Results / Procedures / Treatments   Labs (all labs ordered are listed, but only abnormal results are displayed) Results for orders placed or performed during the hospital encounter of 03/19/20  CBG monitoring, ED  Result Value Ref Range   Glucose-Capillary 160 (H) 70 - 99 mg/dL   Comment 1 Notify RN    Comment 2 Document in Chart     EKG None  Radiology No results found.  Procedures Procedures   Medications Ordered in ED Medications - No data to display  ED Course  I have reviewed the triage vital signs and the nursing notes.  Pertinent labs & imaging results that were available during my care of the patient were reviewed by me and considered in my medical decision making (see chart for details).    MDM Rules/Calculators/A&P                          Patient indicates earlier headache completely resolved. Was  mild-mod, gradual onset, but now completely resolved. Pt expresses interest in getting blood sugar and blood pressure checked.   Reviewed nursing notes and prior charts for additional history.   Labs reviewed/interpreted by me - glucose 160.   Patients bp is normal.   Pt remains asymptomatic, and indicates has appt with her doctor tomorrow ([facility] opc).  Pt currently appears stable for d/c.    Final Clinical Impression(s) / ED Diagnoses Final diagnoses:  None    Rx / DC Orders ED Discharge Orders    None       Cathren Laine, MD 03/19/20 [phone]

## 2020-03-19 NOTE — Discharge Instructions (Addendum)
It was our pleasure to provide your ER care today - we hope that you feel better.  Follow up with your doctor tomorrow as planned.   Continue to drink adequate fluids, follow diabetic meal plan.  Return to ER if worse, new symptoms, fevers, new/severe pain, chest pain, trouble breathing, or other concern.

## 2020-03-20 ENCOUNTER — Telehealth: Payer: Self-pay | Admitting: *Deleted

## 2020-03-20 ENCOUNTER — Encounter: Admitting: Internal Medicine

## 2020-03-20 NOTE — Telephone Encounter (Signed)
Unable to contact patient the 230# has no voice mail set up / the 617# is a fax number.  Unable to leave message for patient regarding her missed appointment to day to ger rescheduled.

## 2020-06-28 NOTE — Progress Notes (Deleted)
   CC: ***  HPI:  Ms.Hibo Aveena Bari is a 55 y.o. {GC/GE:3044014::"man","woman"} with history as below who presents to clinic for ***. {GC/GE:3044014::"His","Her"} last clinic visit was on ***.   To see the details of this patient's management of their acute and chronic problems, please refer to the Assessment & Plan under the Encounters tab.    Past Medical History:  Diagnosis Date  . Anemia    when in late teens  . Anxiety   . Breast abscess   . Chlamydia   . Depression    PTSD also  . Diabetes mellitus without complication (HCC)   . Foot pain, bilateral    "burning"  . Gonorrhea   . High cholesterol   . Hypercholesteremia   . Hypertension   . Recurrent upper respiratory infection (URI)    URI/SOB-treated with antibiotics-cleared up now  . Seizures (HCC)   . Substance abuse (HCC)    alcohol  . Tuberculosis    was treated   Review of Systems:    ROS  Physical Exam:  There were no vitals filed for this visit. Constitutional: well-appearing {GC/GE:3044014::"man","woman"} sitting in chair, in no acute distress HENT: normocephalic atraumatic, mucous membranes moist Eyes: conjunctiva non-erythematous Neck: supple Cardiovascular: regular rate and rhythm, no m/r/g Pulmonary/Chest: normal work of breathing on room air, lungs clear to auscultation bilaterally Abdominal: soft, non-tender, non-distended MSK: normal bulk and tone Neurological: alert & oriented x 3, 5/5 strength in bilateral upper and lower extremities, normal gait Skin: warm and dry*** Psych: ***    Assessment & Plan:   See Encounters Tab for problem-based charting.  Patient {GC/GE:3044014::"discussed with","seen with"} Dr. {NAMES:3044014::"Williams","Guilloud","Hoffman","Mullen","Narendra","Raines","Vincent"}

## 2020-06-29 ENCOUNTER — Encounter: Admitting: Student

## 2020-06-30 ENCOUNTER — Encounter: Payer: Self-pay | Admitting: *Deleted

## 2020-07-04 ENCOUNTER — Encounter: Payer: Self-pay | Admitting: Internal Medicine

## 2020-07-04 ENCOUNTER — Encounter: Admitting: Student

## 2020-07-04 NOTE — Progress Notes (Deleted)
Office Visit   Patient ID: [name], female    DOB: [birth year], 55 y.o.   MRN: [id number]   PCP: Bridget Hartshorn, DO   Subjective:  CC:   HPI:  [name] is a 55 y.o. {GC/GE:3044014::"man","woman"} with history as below who presents to clinic for ***. {GC/GE:3044014::"His","Her"} last clinic visit was on ***.   To see the details of this patient's management of their acute and chronic problems, please refer to the Assessment & Plan under the Encounters tab.    Review of Systems:   ROS  Past Medical History:  Diagnosis Date   Anemia    when in late teens   Anxiety    Breast abscess    Chlamydia    Depression    PTSD also   Diabetes mellitus without complication (HCC)    Foot pain, bilateral    "burning"   Gonorrhea    High cholesterol    Hypercholesteremia    Hypertension    Recurrent upper respiratory infection (URI)    URI/SOB-treated with antibiotics-cleared up now   Seizures (HCC)    Substance abuse (HCC)    alcohol   Tuberculosis    was treated        ACTIVE MEDICATIONS   Outpatient Medications Prior to Visit  Medication Sig Dispense Refill   atorvastatin (LIPITOR) 40 MG tablet Take 1 tablet (40 mg total) by mouth daily. 90 tablet 0   calcium carbonate (OS-CAL - DOSED IN MG OF ELEMENTAL CALCIUM) 1250 (500 Ca) MG tablet Take 1 tablet by mouth daily with breakfast.     carvedilol (COREG) 6.25 MG tablet Take 1 tablet (6.25 mg total) by mouth 2 (two) times daily. 180 tablet 0   ibuprofen (ADVIL) 200 MG tablet Take 400-600 mg by mouth every 6 (six) hours as needed for headache.     lisinopril (ZESTRIL) 10 MG tablet Take 1 tablet (10 mg total) by mouth daily. 90 tablet 0   Magnesium Oxide 400 MG CAPS Take 1 capsule (400 mg total) by mouth daily. 30 capsule 0   metFORMIN (GLUCOPHAGE) 500 MG tablet Take 1 tablet (500 mg total) by mouth 2 (two) times daily with a meal. 180 tablet 0   sertraline (ZOLOFT) 50 MG tablet Take 1 tablet (50  mg total) by mouth daily. 90 tablet 0   thiamine (VITAMIN B-1) 100 MG tablet Take 1 tablet (100 mg total) by mouth daily. 30 tablet 0   No facility-administered medications prior to visit.     Objective:   LMP 01/22/2013  Wt Readings from Last 3 Encounters:  03/19/20 160 lb (72.6 kg)  06/25/19 163 lb 2.3 oz (74 kg)  12/07/18 163 lb 8 oz (74.2 kg)   BP Readings from Last 3 Encounters:  03/19/20 116/87  06/25/19 127/80  12/07/18 (!) 174/93   Constitutional: well-appearing {GC/GE:3044014::"man","woman"} sitting in chair, in no acute distress HENT: normocephalic atraumatic, mucous membranes moist Eyes: conjunctiva non-erythematous Neck: supple Cardiovascular: regular rate and rhythm, no m/r/g, no lower extremity edema Pulmonary/Chest: normal work of breathing on room air, lungs clear to auscultation bilaterally Abdominal: soft, non-tender, non-distended MSK: normal bulk and tone Neurological: alert & oriented x 3, 5/5 strength in bilateral upper and lower extremities, normal gait Skin: warm and dry*** Psych: ***  Health Maintenance:   Health Maintenance  Topic Date Due   COVID-19 Vaccine (1) Never done   Pneumococcal Vaccine 51-39 Years old (1 - PCV) Never done   TETANUS/TDAP  Never done  Zoster Vaccines- Shingrix (1 of 2) Never done   COLONOSCOPY (Pts 45-17yrs Insurance coverage will need to be confirmed)  Never done   OPHTHALMOLOGY EXAM  08/13/2014   PAP SMEAR-Modifier  11/09/2016   LIPID PANEL  11/11/2017   FOOT EXAM  07/10/2018   HEMOGLOBIN A1C  02/13/2019   MAMMOGRAM  06/08/2020   INFLUENZA VACCINE  08/21/2020   PNEUMOCOCCAL POLYSACCHARIDE VACCINE AGE 62-64 HIGH RISK  Completed   Hepatitis C Screening  Completed   HIV Screening  Completed   HPV VACCINES  Aged Out     Assessment & Plan:   Problem List Items Addressed This Visit   None    No follow-ups on file.   Pt discussed with ***  Carrie Severance, MD Internal Medicine Resident, PGY-1 [name]  Internal Medicine Residency Pager: [phone] 8:35 AM, 07/04/2020

## 2020-07-06 ENCOUNTER — Encounter: Admitting: Student

## 2020-07-06 NOTE — Progress Notes (Deleted)
Office Visit   Patient ID: [name], female    DOB: [birth year], 55 y.o.   MRN: [id number]   PCP: Bridget Hartshorn, DO   Subjective:  CC:   HPI:  [name] is a 55 y.o. {GC/GE:3044014::"man","woman"} with history as below who presents to clinic for ***. {GC/GE:3044014::"His","Her"} last clinic visit was on ***.   To see the details of this patient's management of their acute and chronic problems, please refer to the Assessment & Plan under the Encounters tab.    Review of Systems:   ROS  Past Medical History:  Diagnosis Date   Anemia    when in late teens   Anxiety    Breast abscess    Chlamydia    Depression    PTSD also   Diabetes mellitus without complication (HCC)    Foot pain, bilateral    "burning"   Gonorrhea    High cholesterol    Hypercholesteremia    Hypertension    Recurrent upper respiratory infection (URI)    URI/SOB-treated with antibiotics-cleared up now   Seizures (HCC)    Substance abuse (HCC)    alcohol   Tuberculosis    was treated        ACTIVE MEDICATIONS   Outpatient Medications Prior to Visit  Medication Sig Dispense Refill   atorvastatin (LIPITOR) 40 MG tablet Take 1 tablet (40 mg total) by mouth daily. 90 tablet 0   calcium carbonate (OS-CAL - DOSED IN MG OF ELEMENTAL CALCIUM) 1250 (500 Ca) MG tablet Take 1 tablet by mouth daily with breakfast.     carvedilol (COREG) 6.25 MG tablet Take 1 tablet (6.25 mg total) by mouth 2 (two) times daily. 180 tablet 0   ibuprofen (ADVIL) 200 MG tablet Take 400-600 mg by mouth every 6 (six) hours as needed for headache.     lisinopril (ZESTRIL) 10 MG tablet Take 1 tablet (10 mg total) by mouth daily. 90 tablet 0   Magnesium Oxide 400 MG CAPS Take 1 capsule (400 mg total) by mouth daily. 30 capsule 0   metFORMIN (GLUCOPHAGE) 500 MG tablet Take 1 tablet (500 mg total) by mouth 2 (two) times daily with a meal. 180 tablet 0   sertraline (ZOLOFT) 50 MG tablet Take 1 tablet (50  mg total) by mouth daily. 90 tablet 0   thiamine (VITAMIN B-1) 100 MG tablet Take 1 tablet (100 mg total) by mouth daily. 30 tablet 0   No facility-administered medications prior to visit.     Objective:   LMP 01/22/2013  Wt Readings from Last 3 Encounters:  03/19/20 160 lb (72.6 kg)  06/25/19 163 lb 2.3 oz (74 kg)  12/07/18 163 lb 8 oz (74.2 kg)   BP Readings from Last 3 Encounters:  03/19/20 116/87  06/25/19 127/80  12/07/18 (!) 174/93   Constitutional: well-appearing {GC/GE:3044014::"man","woman"} sitting in chair, in no acute distress HENT: normocephalic atraumatic, mucous membranes moist Eyes: conjunctiva non-erythematous Neck: supple Cardiovascular: regular rate and rhythm, no m/r/g, no lower extremity edema Pulmonary/Chest: normal work of breathing on room air, lungs clear to auscultation bilaterally Abdominal: soft, non-tender, non-distended MSK: normal bulk and tone Neurological: alert & oriented x 3, 5/5 strength in bilateral upper and lower extremities, normal gait Skin: warm and dry*** Psych: ***  Health Maintenance:   Health Maintenance  Topic Date Due   COVID-19 Vaccine (1) Never done   Pneumococcal Vaccine 51-39 Years old (1 - PCV) Never done   TETANUS/TDAP  Never done  Zoster Vaccines- Shingrix (1 of 2) Never done   COLONOSCOPY (Pts 45-51yrs Insurance coverage will need to be confirmed)  Never done   OPHTHALMOLOGY EXAM  08/13/2014   PAP SMEAR-Modifier  11/09/2016   LIPID PANEL  11/11/2017   FOOT EXAM  07/10/2018   HEMOGLOBIN A1C  02/13/2019   MAMMOGRAM  06/08/2020   INFLUENZA VACCINE  08/21/2020   PNEUMOCOCCAL POLYSACCHARIDE VACCINE AGE 30-64 HIGH RISK  Completed   Hepatitis C Screening  Completed   HIV Screening  Completed   HPV VACCINES  Aged Out     Assessment & Plan:   Problem List Items Addressed This Visit   None    No follow-ups on file.   Pt discussed with ***  Alphonzo Severance, MD Internal Medicine Resident, PGY-1 [facility] Res[facility] Pager: [phone] 9:54 AM, 07/06/2020

## 2020-07-11 ENCOUNTER — Encounter: Payer: Self-pay | Admitting: Student

## 2020-07-11 ENCOUNTER — Ambulatory Visit (INDEPENDENT_AMBULATORY_CARE_PROVIDER_SITE_OTHER): Admitting: Student

## 2020-07-11 ENCOUNTER — Other Ambulatory Visit: Payer: Self-pay

## 2020-07-11 VITALS — BP 138/82 | HR 98 | Temp 98.3°F | Ht 63.0 in | Wt 166.8 lb

## 2020-07-11 DIAGNOSIS — Z87898 Personal history of other specified conditions: Secondary | ICD-10-CM | POA: Diagnosis not present

## 2020-07-11 DIAGNOSIS — E1169 Type 2 diabetes mellitus with other specified complication: Secondary | ICD-10-CM

## 2020-07-11 DIAGNOSIS — Z0000 Encounter for general adult medical examination without abnormal findings: Secondary | ICD-10-CM | POA: Diagnosis not present

## 2020-07-11 DIAGNOSIS — R188 Other ascites: Secondary | ICD-10-CM | POA: Diagnosis not present

## 2020-07-11 DIAGNOSIS — E1129 Type 2 diabetes mellitus with other diabetic kidney complication: Secondary | ICD-10-CM

## 2020-07-11 DIAGNOSIS — R809 Proteinuria, unspecified: Secondary | ICD-10-CM | POA: Diagnosis not present

## 2020-07-11 DIAGNOSIS — E785 Hyperlipidemia, unspecified: Secondary | ICD-10-CM

## 2020-07-11 LAB — POCT GLYCOSYLATED HEMOGLOBIN (HGB A1C): Hemoglobin A1C: 4.9 % (ref 4.0–5.6)

## 2020-07-11 LAB — PROTIME-INR
INR: 1.6 — ABNORMAL HIGH (ref 0.8–1.2)
Prothrombin Time: 19.4 seconds — ABNORMAL HIGH (ref 11.4–15.2)

## 2020-07-11 LAB — GLUCOSE, CAPILLARY: Glucose-Capillary: 137 mg/dL — ABNORMAL HIGH (ref 70–99)

## 2020-07-11 NOTE — Progress Notes (Signed)
Office Visit   Patient ID: [name], female    DOB: 04/25/1965, 55 y.o.   MRN: [id number]   PCP: [name], Carrie D, DO   Subjective:   CC: medication refills  HPI:  [name] is a 55 y.o. woman with history as below who presents to clinic for medication refills. She was last seen in the [facility] 19 months ago on 12/07/18. She reports she has not taken any of her medications for the last year.   To see the details of this patient's management of their acute and chronic problems, please refer to the Assessment & Plan under the Encounters tab.   Review of Systems:   Review of Systems  Constitutional:  Negative for chills, diaphoresis, fever, malaise/fatigue and weight loss.  HENT:  Negative for congestion.   Eyes:  Negative for blurred vision.  Respiratory:  Negative for cough and shortness of breath.   Cardiovascular:  Positive for leg swelling. Negative for chest pain.  Gastrointestinal:  Positive for nausea. Negative for abdominal pain, blood in stool, constipation and vomiting.  Genitourinary:  Negative for dysuria.  Musculoskeletal:  Negative for myalgias.  Neurological:  Negative for dizziness, focal weakness and headaches.   Past Medical History:  Diagnosis Date   Anemia    when in late teens   Anxiety    Breast abscess    Chlamydia    Depression    PTSD also   Diabetes mellitus without complication (HCC)    Foot pain, bilateral    "burning"   Gonorrhea    High cholesterol    Hypercholesteremia    Hypertension    Orthostatic hypotension 12/22/2017   Recurrent upper respiratory infection (URI)    URI/SOB-treated with antibiotics-cleared up now   Seizures (HCC)    Substance abuse (HCC)    alcohol   Tuberculosis    was treated       ACTIVE MEDICATIONS   Outpatient Medications Prior to Visit  Medication Sig Dispense Refill   atorvastatin (LIPITOR) 40 MG tablet Take 1 tablet (40 mg total) by mouth daily. 90 tablet 0    calcium carbonate (OS-CAL - DOSED IN MG OF ELEMENTAL CALCIUM) 1250 (500 Ca) MG tablet Take 1 tablet by mouth daily with breakfast.     ibuprofen (ADVIL) 200 MG tablet Take 400-600 mg by mouth every 6 (six) hours as needed for headache.     Magnesium Oxide 400 MG CAPS Take 1 capsule (400 mg total) by mouth daily. 30 capsule 0   sertraline (ZOLOFT) 50 MG tablet Take 1 tablet (50 mg total) by mouth daily. 90 tablet 0   thiamine (VITAMIN B-1) 100 MG tablet Take 1 tablet (100 mg total) by mouth daily. 30 tablet 0   carvedilol (COREG) 6.25 MG tablet Take 1 tablet (6.25 mg total) by mouth 2 (two) times daily. 180 tablet 0   lisinopril (ZESTRIL) 10 MG tablet Take 1 tablet (10 mg total) by mouth daily. 90 tablet 0   metFORMIN (GLUCOPHAGE) 500 MG tablet Take 1 tablet (500 mg total) by mouth 2 (two) times daily with a meal. 180 tablet 0   No facility-administered medications prior to visit.   Objective:   BP 138/82 (BP Location: Right Arm, Patient Position: Standing, Cuff Size: Normal)   Pulse 98   Temp 98.3 F (36.8 C) (Oral)   Ht 5\' 3"  (1.6 m)   Wt 166 lb 12.8 oz (75.7 kg)   LMP 01/22/2013   SpO2 100% Comment: RA  BMI 29.55 kg/m  Wt Readings from Last 3 Encounters:  07/11/20 166 lb 12.8 oz (75.7 kg)  03/19/20 160 lb (72.6 kg)  06/25/19 163 lb 2.3 oz (74 kg)   BP Readings from Last 3 Encounters:  07/11/20 138/82  03/19/20 116/87  06/25/19 127/80   Constitutional: tired-appearing woman sitting in chair, in no acute distress, appears older than stated age HENT: normocephalic atraumatic, mucous membranes moist Eyes: conjunctiva non-erythematous, no scleral icterus Neck: supple Cardiovascular: regular rate and rhythm, no m/r/g, trace RLE pitting edema, 1+ LLE pitting edema Pulmonary/Chest: normal work of breathing on room air, lungs clear to auscultation bilaterally Abdominal: abdomen is firm and distended, fluid wave+, non-tender, unable to palpate liver MSK: normal bulk and  tone Neurological: alert & oriented x 3, 5/5 strength in bilateral upper and lower extremities, normal gait Skin: warm and dry, sallow, spider angioma scattered across chest, palmar erythema bilaterally Psych: normal mood, flat affect  Health Maintenance:   Health Maintenance  Topic Date Due   COVID-19 Vaccine (1) Never done   Pneumococcal Vaccine 46-73 Years old (1 - PCV) Never done   TETANUS/TDAP  Never done   Zoster Vaccines- Shingrix (1 of 2) Never done   COLONOSCOPY (Pts 45-26yrs Insurance coverage will need to be confirmed)  Never done   URINE MICROALBUMIN  04/15/2014   OPHTHALMOLOGY EXAM  08/13/2014   PAP SMEAR-Modifier  11/09/2016   MAMMOGRAM  06/08/2020   INFLUENZA VACCINE  08/21/2020   HEMOGLOBIN A1C  10/11/2020   FOOT EXAM  07/11/2021   LIPID PANEL  07/11/2021   PNEUMOCOCCAL POLYSACCHARIDE VACCINE AGE 20-64 HIGH RISK  Completed   Hepatitis C Screening  Completed   HIV Screening  Completed   HPV VACCINES  Aged Out   Assessment & Plan:   Problem List Items Addressed This Visit       Endocrine   Type 2 diabetes mellitus with hyperlipidemia (HCC)    Patient presents to clinic 19 months after her last visit in 11/2018. She reports she has been out of her medications for the last year, including metformin 1000 mg twice daily.  Component Ref Range & Units 2 Wilkinson ago  (07/11/20) 1 yr ago  (11/13/18)  Hemoglobin A1C 4.0 - 5.6 % 4.9  6.2 High  R, CM    A/P: A1c is 4.9%. Patient has not been taking metformin for the last year. I will not re-prescribe at this time.       Relevant Medications   furosemide (LASIX) 20 MG tablet   spironolactone (ALDACTONE) 50 MG tablet     Other   Healthcare maintenance    Patient is due for several health maintenance items. Due to today's focus on her suspected decompensated cirrhosis, will address at subsequent visits. Did recommend patient start her COVID vaccine series.       History of alcohol use disorder    Patient presents to  clinic 19 months after her last visit in 11/2018. She reports she has been out of her medications for the last year and has mostly kept to herself and tried to avoid seeing doctors during the pandemic.  On chart review, she has a history of alcohol use disorder which most recently led to hospitalization in 10/2018 for acute on chronic hyponatremia and orthostatic hypotension suspected to be due to decreased PO intake in the setting of increased alcohol use. She also has a documented history of elevated liver enzymes.   She states she came to the doctor today in  order to get refills of her hypertension and diabetes medications. She reports she stopped drinking 6 months ago and stopped smoking cigarettes 3 months ago. When asked how she was able to she states, "I just did it." She has no specific complaints today. She appears older than stated age and does have a prominent/distended abdomen. When I ask her about this, she states, "I've been eating a lot." She denies fevers, chills, abdominal pain, chest pain, shortness of breath, confusion. She notes she has been having lower extremity swelling, L>R, and shows me an over the counter diuretic she bought recently and asks if she should take it. Additionally, she asks if an OTC antiemetic is safe. When asked about her nausea, she states it started yesterday.  Her physical exam is notable for trace RLE pitting edema, 1+ LLE pitting edema; abdomen is firm and distended, fluid wave+, non-tender, unable to palpate liver; Skin is sallow, spider angioma scattered across chest, palmar erythema bilaterally.  Assessment/Plan: With patient's history of alcohol use disorder, recent onset of lower extremity swelling, nausea, exam findings of suspected ascites, palmar erythema, spider angiomas, I am highly suspicious for decompensated cirrhosis which I suspect is from her years of alcohol use disorder. I will obtain the work-up as above. Anticipate starting diuretic therapy  and bringing patient back for diagnostic and therapeutic abdominal paracentesis depending on results. I do not have suspicion for hepatic encephalopathy at this time. - CMP, CBC with diff, PT/INR - RUQ ultrasound  ADDENDUM (07/13/20): Patient's lab work significant for PT/INR 19.4/1.6, Na 128, K 3.0, AST/ALT/ALP 145/36/459, Tbili 7.2, albumin 2.3, BUN/Cr 4/0.55, WBC 14.1, Hgb 9.9, Plts 148k. MELD score 25, Child Class C (for moderate ascites). Patient does have leukocytosis, however without fevers, chills, abdominal pain, I have low suspicion for SBP.  Plan: Attempted to call patient several times on 6/22 without answer, however did reach her today.  - Start Lasix 20 mg daily and spironolactone 50 mg daily - RTC in 1 week for diagnostic and therapeutic abdominal paracentesis - STAT RUQ ultrasound already ordered, now scheduled for 07/14/2020 12:00 PM [facility]         Hyperlipidemia    Patient previously prescribed atorvastatin 40 mg daily. She reports she has been out of her medications for the last year but has been taking a [employer] brand OTC herbal cholesterol support supplement.   Plan: Will repeat lipid panel today.  ADDENDUM: Component Ref Range & Units 2 Wilkinson ago 3 yr ago  Cholesterol, Total 100 - 199 mg/dL 416  606 High    Triglycerides 0 - 149 mg/dL 301  601 High    HDL >09 mg/dL 17 Low   47   VLDL Cholesterol Cal 5 - 40 mg/dL 24  31   LDL Chol Calc (NIH) 0 - 99 mg/dL 323 High     Chol/HDL Ratio 0.0 - 4.4 ratio 55.7 High   4.5 High  CM    Accounting for the above, patient's current 10-year ASCVD risk is 9.6%. Will discuss re-initiation of at least a moderate intensity statin at next visit.       Relevant Medications   furosemide (LASIX) 20 MG tablet   spironolactone (ALDACTONE) 50 MG tablet   Other Visit Diagnoses     Other ascites    -  Primary   Relevant Medications   furosemide (LASIX) 20 MG tablet   spironolactone (ALDACTONE) 50 MG  tablet   Other Relevant Orders   CMP14 + Anion Gap (  Completed)   CBC with Diff (Completed)   Lipid Profile (Completed)   Protime-INR (Completed)   US Abdomen Limited RUQ (LIVER/GB)   Type 2 diabetes mellitus with microalbuminuria, without long-term current use of insulin (HCC)       Relevant Orders   POC Hbg A1C (Completed)       Return in about 2 weeks (around 07/25/2020). Correction - based on lab results, will have patient follow-up in 1-week for diagnostic and therapeutic abdominal paracentesis.   Pt seen with Dr. Antony Contras.  Alphonzo Severance, MD Internal Medicine Resident, PGY-1 [facility] Pager: [phone] 5:13 PM, 07/13/2020

## 2020-07-11 NOTE — Patient Instructions (Addendum)
[name],   Thank you for your visit to the [facility] today. It was a pleasure meeting you. Today we discussed the following:  1) Abdominal swelling - I am getting some lab work to evaluate your electrolytes, blood counts, and liver function - I am getting a picture of your belly to evaluate your liver - Continue to abstain from alcohol and smoking cigarettes - Depending on your blood work, I will likely start you on a fluid pill  2) Diabetes, hypertension - You do not currently need to be on any diabetes medication. I will likely start you on a blood pressure medication once I have your blood work back.  3) Healthcare maintenance - You are due for several screening tests. We will discuss at future appointments. - I HIGHLY recommend you get vaccinated against COVID. You can go to your local pharmacy for this.  We would like to see you back in 2 weeks. Please bring all of your medications with you.  You have been assigned a new primary care doctor, Dr. Champ Mungo. You should be receiving a letter in the mail soon with this information.      If you have any questions or concerns, please call our clinic at [phone] between 9am-5pm. Outside of these hours, call [phone] and ask for the internal medicine resident on call. If you feel you are having a medical emergency please call 911.

## 2020-07-12 LAB — CMP14 + ANION GAP
ALT: 36 IU/L — ABNORMAL HIGH (ref 0–32)
AST: 145 IU/L — ABNORMAL HIGH (ref 0–40)
Albumin/Globulin Ratio: 0.4 — ABNORMAL LOW (ref 1.2–2.2)
Albumin: 2.3 g/dL — ABNORMAL LOW (ref 3.8–4.9)
Alkaline Phosphatase: 459 IU/L — ABNORMAL HIGH (ref 44–121)
Anion Gap: 17 mmol/L (ref 10.0–18.0)
BUN/Creatinine Ratio: 7 — ABNORMAL LOW (ref 9–23)
BUN: 4 mg/dL — ABNORMAL LOW (ref 6–24)
Bilirubin Total: 7.2 mg/dL — ABNORMAL HIGH (ref 0.0–1.2)
CO2: 20 mmol/L (ref 20–29)
Calcium: 7.8 mg/dL — ABNORMAL LOW (ref 8.7–10.2)
Chloride: 91 mmol/L — ABNORMAL LOW (ref 96–106)
Creatinine, Ser: 0.55 mg/dL — ABNORMAL LOW (ref 0.57–1.00)
Globulin, Total: 5.5 g/dL — ABNORMAL HIGH (ref 1.5–4.5)
Glucose: 113 mg/dL — ABNORMAL HIGH (ref 65–99)
Potassium: 3 mmol/L — ABNORMAL LOW (ref 3.5–5.2)
Sodium: 128 mmol/L — ABNORMAL LOW (ref 134–144)
Total Protein: 7.8 g/dL (ref 6.0–8.5)
eGFR: 109 mL/min/{1.73_m2} (ref >59)

## 2020-07-12 LAB — CBC WITH DIFFERENTIAL/PLATELET
Basophils Absolute: 0.1 10*3/uL (ref 0.0–0.2)
Basos: 0 %
EOS (ABSOLUTE): 0.1 10*3/uL (ref 0.0–0.4)
Eos: 1 %
Hematocrit: 27.5 % — ABNORMAL LOW (ref 34.0–46.6)
Hemoglobin: 9.9 g/dL — ABNORMAL LOW (ref 11.1–15.9)
Immature Grans (Abs): 0.1 10*3/uL (ref 0.0–0.1)
Immature Granulocytes: 1 %
Lymphocytes Absolute: 1.9 10*3/uL (ref 0.7–3.1)
Lymphs: 14 %
MCH: 31.3 pg (ref 26.6–33.0)
MCHC: 36 g/dL — ABNORMAL HIGH (ref 31.5–35.7)
MCV: 87 fL (ref 79–97)
Monocytes Absolute: 0.9 10*3/uL (ref 0.1–0.9)
Monocytes: 6 %
Neutrophils Absolute: 11.1 10*3/uL — ABNORMAL HIGH (ref 1.4–7.0)
Neutrophils: 78 %
Platelets: 148 10*3/uL — ABNORMAL LOW (ref 150–450)
RBC: 3.16 x10E6/uL — ABNORMAL LOW (ref 3.77–5.28)
RDW: 13.1 % (ref 11.7–15.4)
WBC: 14.1 10*3/uL — ABNORMAL HIGH (ref 3.4–10.8)

## 2020-07-12 LAB — LIPID PANEL
Chol/HDL Ratio: 10.6 ratio — ABNORMAL HIGH (ref 0.0–4.4)
Cholesterol, Total: 180 mg/dL (ref 100–199)
HDL: 17 mg/dL — ABNORMAL LOW (ref >39)
LDL Chol Calc (NIH): 139 mg/dL — ABNORMAL HIGH (ref 0–99)
Triglycerides: 131 mg/dL (ref 0–149)
VLDL Cholesterol Cal: 24 mg/dL (ref 5–40)

## 2020-07-12 MED ORDER — SPIRONOLACTONE 50 MG PO TABS: 50.0000 mg | ORAL_TABLET | Freq: Every day | ORAL | 11 refills | Status: AC

## 2020-07-12 MED ORDER — FUROSEMIDE 20 MG PO TABS: 20.0000 mg | ORAL_TABLET | Freq: Every day | ORAL | 11 refills | Status: AC

## 2020-07-13 ENCOUNTER — Encounter: Payer: Self-pay | Admitting: Student

## 2020-07-13 DIAGNOSIS — Z87898 Personal history of other specified conditions: Secondary | ICD-10-CM | POA: Insufficient documentation

## 2020-07-13 NOTE — Assessment & Plan Note (Signed)
Patient presents to clinic 19 months after her last visit in 11/2018. She reports she has been out of her medications for the last year but has been taking [employer] brand OTC dietary supplements for cholesterol and blood pressure support. States she has mostly kept to herself since the onset of the COVID pandemic and has been wary of coming to the doctor.   BP today is 138/82. She was previously prescribed lisinopril 40 mg daily. She denies headache, dizziness, lightheadedness, blurry vision.   On chart review, she has previously had multiple admissions for orthostatic hypotension.   A/P: Given her BP is only mildly elevated today and that I will likely be prescribing diuretic therapy for suspected decompensated cirrhosis, I will not re-prescribe any antihypertensive at this time.

## 2020-07-13 NOTE — Assessment & Plan Note (Signed)
Patient is due for several health maintenance items. Due to today's focus on her suspected decompensated cirrhosis, will address at subsequent visits. Did recommend patient start her COVID vaccine series.

## 2020-07-13 NOTE — Assessment & Plan Note (Addendum)
Patient previously prescribed atorvastatin 40 mg daily. She reports she has been out of her medications for the last year but has been taking a [employer] brand OTC herbal cholesterol support supplement.   Plan: Will repeat lipid panel today.  ADDENDUM: Component Ref Range & Units 2 d ago 3 yr ago  Cholesterol, Total 100 - 199 mg/dL 045  [id number]   Triglycerides 0 - 149 mg/dL 147  [id number]   HDL >30 mg/dL [id number]  47   VLDL Cholesterol Cal 5 - 40 mg/dL 24  31   LDL Chol Calc (NIH) 0 - 99 mg/dL [id number]    Chol/HDL Ratio 0.0 - 4.4 ratio 10.6High  4.5High CM    Accounting for the above, patient's current 10-year ASCVD risk is 9.6%. Will discuss re-initiation of at least a moderate intensity statin at next visit.

## 2020-07-13 NOTE — Assessment & Plan Note (Signed)
Patient presents to clinic 19 months after her last visit in 11/2018. She reports she has been out of her medications for the last year, including metformin 1000 mg twice daily.  Component Ref Range & Units 2 d ago  (07/11/20) 1 yr ago  (11/13/18)  Hemoglobin A1C 4.0 - 5.6 % 4.9  6.2High R, CM    A/P: A1c is 4.9%. Patient has not been taking metformin for the last year. I will not re-prescribe at this time.

## 2020-07-13 NOTE — Assessment & Plan Note (Addendum)
Patient presents to clinic 19 months after her last visit in 11/2018. She reports she has been out of her medications for the last year and has mostly kept to herself and tried to avoid seeing doctors during the pandemic.  On chart review, she has a history of alcohol use disorder which most recently led to hospitalization in 10/2018 for acute on chronic hyponatremia and orthostatic hypotension suspected to be due to decreased PO intake in the setting of increased alcohol use. She also has a documented history of elevated liver enzymes.   She states she came to the doctor today in order to get refills of her hypertension and diabetes medications. She reports she stopped drinking 6 months ago and stopped smoking cigarettes 3 months ago. When asked how she was able to she states, "I just did it." She has no specific complaints today. She appears older than stated age and does have a prominent/distended abdomen. When I ask her about this, she states, "I've been eating a lot." She denies fevers, chills, abdominal pain, chest pain, shortness of breath, confusion. She notes she has been having lower extremity swelling, L>R, and shows me an over the counter diuretic she bought recently and asks if she should take it. Additionally, she asks if an OTC antiemetic is safe. When asked about her nausea, she states it started yesterday.  Her physical exam is notable for trace RLE pitting edema, 1+ LLE pitting edema; abdomen is firm and distended, fluid wave+, non-tender, unable to palpate liver; Skin is sallow, spider angioma scattered across chest, palmar erythema bilaterally.  Assessment/Plan: With patient's history of alcohol use disorder, recent onset of lower extremity swelling, nausea, exam findings of suspected ascites, palmar erythema, spider angiomas, I am highly suspicious for decompensated cirrhosis which I suspect is from her years of alcohol use disorder. I will obtain the work-up as above. Anticipate  starting diuretic therapy and bringing patient back for diagnostic and therapeutic abdominal paracentesis depending on results. I do not have suspicion for hepatic encephalopathy at this time. - CMP, CBC with diff, PT/INR - RUQ ultrasound  ADDENDUM (07/13/20): Patient's lab work significant for PT/INR 19.4/1.6, Na 128, K 3.0, AST/ALT/ALP 145/36/459, Tbili 7.2, albumin 2.3, BUN/Cr 4/0.55, WBC 14.1, Hgb 9.9, Plts 148k. MELD score 25, Child Class C (for moderate ascites). Patient does have leukocytosis, however without fevers, chills, abdominal pain, I have low suspicion for SBP.  Plan: Attempted to call patient several times on 6/22 without answer, however did reach her today.  - Start Lasix 20 mg daily and spironolactone 50 mg daily - RTC in 1 week for diagnostic and therapeutic abdominal paracentesis - STAT RUQ ultrasound already ordered, now scheduled for 07/14/2020 12:00 PM [facility]

## 2020-07-14 ENCOUNTER — Ambulatory Visit (HOSPITAL_BASED_OUTPATIENT_CLINIC_OR_DEPARTMENT_OTHER)

## 2020-07-18 NOTE — Progress Notes (Signed)
Internal Medicine Clinic Attending  Case discussed with Dr. Claudette Laws  At the time of the visit.  We reviewed the resident's history and exam and pertinent patient test results.  I agree with the assessment, diagnosis, and plan of care documented in the resident's note.   Unfortunately patient no showed her stat ultrasound. Has follow up scheduled in [facility] on 6/30.

## 2020-07-20 ENCOUNTER — Encounter: Admitting: Student

## 2020-07-20 ENCOUNTER — Other Ambulatory Visit: Payer: Self-pay | Admitting: Student

## 2020-07-20 DIAGNOSIS — R188 Other ascites: Secondary | ICD-10-CM

## 2020-07-21 ENCOUNTER — Telehealth: Payer: Self-pay

## 2020-07-21 ENCOUNTER — Telehealth: Payer: Self-pay | Admitting: *Deleted

## 2020-07-21 NOTE — Chronic Care Management (AMB) (Signed)
  Care Management   Note  07/21/2020 Name: [name] MRN: [id number] DOB: 16-Feb-1965  [name] is a 55 y.o. year old female who is a primary care patient of Champ Mungo, DO. I reached out to Neville Route Bifulco by phone today in response to a referral sent by Ms. [name]'s PCP, Champ Mungo, DO.  [name] was given information about care management services today including:  Care management services include personalized support from designated clinical staff supervised by her physician, including individualized plan of care and coordination with other care providers 24/7 contact phone numbers for assistance for urgent and routine care needs. The patient may stop care management services at any time by phone call to the office staff.  Patient agreed to services and verbal consent obtained.   Follow up plan: Telephone appointment with care management team member scheduled for: 07/26/2020  [facility] Guide, Embedded Care Coordination [facility] Management

## 2020-07-21 NOTE — Telephone Encounter (Signed)
  Care Management   Outreach Note  07/21/2020 Name: [name] MRN: [id number] DOB: 20-Feb-1965  Referred by: Champ Mungo, DO Reason for referral : No chief complaint on file.   An unsuccessful telephone outreach was attempted today. The patient was referred to the case management team for assistance with care management and care coordination.   Follow Up Plan: The care management team will reach out to the patient again over the next 7 days.   Jodelle Gross, RN, BSN, CCM Care Management Coordinator [facility] Internal Medicine Phone: [phone] / Fax: [phone]

## 2020-07-26 ENCOUNTER — Telehealth: Payer: Self-pay

## 2020-07-26 ENCOUNTER — Telehealth

## 2020-07-26 NOTE — Telephone Encounter (Signed)
  Care Management   Outreach Note  07/26/2020 Name: [name] MRN: [id number] DOB: 09-08-1965  Referred by: Champ Mungo, DO Reason for referral : No chief complaint on file.   An unsuccessful telephone outreach was attempted today. The patient was referred to the case management team for assistance with care management and care coordination.  Patient does not have voicemail set up on phone, unable to leave message.  Follow Up Plan: The care management team will reach out to the patient again over the next 7 days.   Jodelle Gross, RN, BSN, CCM Care Management Coordinator [facility] Internal Medicine Phone: [phone] / Fax: [phone]

## 2020-07-26 NOTE — Telephone Encounter (Signed)
  Care Management   Outreach Note  07/26/2020 Name: [name] MRN: [id number] DOB: 1965-09-24  Referred by: Champ Mungo, DO Reason for referral : No chief complaint on file.   A second unsuccessful telephone outreach was attempted today. The patient was referred to the case management team for assistance with care management and care coordination.   Follow Up Plan: The care management team will reach out to the patient again over the next 7 days.   Jodelle Gross, RN, BSN, CCM Care Management Coordinator [facility] Internal Medicine Phone: [phone] / Fax: [phone]

## 2020-08-02 ENCOUNTER — Telehealth

## 2020-08-02 ENCOUNTER — Telehealth: Payer: Self-pay

## 2020-08-02 NOTE — Telephone Encounter (Signed)
  Chronic Care Management   Note  08/02/2020 Name: [name] MRN: [id number] DOB: [birth year]  [name] is a 55 y.o. year old female who sees Champ Mungo, DO for primary care. PCP asked the care management team to consult the patient for assistance with care management and care coordination needs related to Disease Management, Educational Needs and Care Coordination.   Multiple attempts to reach patient without success.  Patient can contact clinic if she chooses to work with Care Coordination at a later time.  Jodelle Gross, RN, BSN, CCM Care Management Coordinator [facility] Internal Medicine Phone: [phone] / Fax: [phone]

## 2020-08-02 NOTE — Telephone Encounter (Signed)
  Chronic Care Management   Note  08/02/2020 Name: [name] MRN: [id number] DOB: [birth year]  [name] is a 55 y.o. year old female who sees Champ Mungo, DO for primary care. PCP asked the care management team to consult the patient for assistance with care management and care coordination needs related to Disease Management and Care Coordination. Multiple attempts to reach patient without success.  Patient has been provided care management contact information if he/she wishes to engage with care managers in the future.   Jodelle Gross, RN, BSN, CCM Care Management Coordinator [facility] Internal Medicine Phone: [phone] / Fax: [phone]

## 2020-08-08 ENCOUNTER — Telehealth: Payer: Self-pay | Admitting: Internal Medicine

## 2020-08-08 NOTE — Telephone Encounter (Signed)
Pt called / informed to keep the appt on Friday but if she develops severe stomach pain ( in which she denies having any pain at this time) to go to [facility] or ED. Stated she understands.

## 2020-08-08 NOTE — Telephone Encounter (Signed)
Requesting to speak with a nurse about constipation. Please call pt back.  

## 2020-08-08 NOTE — Telephone Encounter (Signed)
Pt calling to report constipation X 5 days after taking her new medications :   furosemide (LASIX) 20 MG tablet  spironolactone (ALDACTONE) 50 MG tablet

## 2020-08-08 NOTE — Telephone Encounter (Signed)
Return pt's call - pt c/o constipation x 5 days. Stated she has tried miralax and dulcolax suppository x 3 days (daily). Stated she thought about trying enemas. Stated the new meds, lasix and aldactone, are causing it. Please advise.

## 2020-08-09 ENCOUNTER — Encounter (HOSPITAL_COMMUNITY): Payer: Self-pay | Admitting: Emergency Medicine

## 2020-08-09 ENCOUNTER — Emergency Department (HOSPITAL_COMMUNITY)

## 2020-08-09 ENCOUNTER — Emergency Department (HOSPITAL_COMMUNITY)
Admission: EM | Admit: 2020-08-09 | Discharge: 2020-08-10 | Disposition: A | Discharge status: Home / Self Care | Attending: Physician Assistant | Admitting: Physician Assistant

## 2020-08-09 ENCOUNTER — Telehealth: Payer: Self-pay | Admitting: *Deleted

## 2020-08-09 ENCOUNTER — Telehealth: Payer: Self-pay

## 2020-08-09 ENCOUNTER — Other Ambulatory Visit: Payer: Self-pay

## 2020-08-09 DIAGNOSIS — Z5321 Procedure and treatment not carried out due to patient leaving prior to being seen by health care provider: Secondary | ICD-10-CM | POA: Insufficient documentation

## 2020-08-09 DIAGNOSIS — R042 Hemoptysis: Secondary | ICD-10-CM | POA: Insufficient documentation

## 2020-08-09 DIAGNOSIS — R17 Unspecified jaundice: Secondary | ICD-10-CM

## 2020-08-09 LAB — COMPREHENSIVE METABOLIC PANEL
ALT: 26 U/L (ref 0–44)
AST: 77 U/L — ABNORMAL HIGH (ref 15–41)
Albumin: 1.7 g/dL — ABNORMAL LOW (ref 3.5–5.0)
Alkaline Phosphatase: 232 U/L — ABNORMAL HIGH (ref 38–126)
Anion gap: 8 (ref 5–15)
BUN: 8 mg/dL (ref 6–20)
CO2: 24 mmol/L (ref 22–32)
Calcium: 7.8 mg/dL — ABNORMAL LOW (ref 8.9–10.3)
Chloride: 92 mmol/L — ABNORMAL LOW (ref 98–111)
Creatinine, Ser: 0.49 mg/dL (ref 0.44–1.00)
GFR, Estimated: >60 mL/min (ref >60)
Glucose, Bld: 124 mg/dL — ABNORMAL HIGH (ref 70–99)
Potassium: 3.2 mmol/L — ABNORMAL LOW (ref 3.5–5.1)
Sodium: 124 mmol/L — ABNORMAL LOW (ref 135–145)
Total Bilirubin: 13.1 mg/dL — ABNORMAL HIGH (ref 0.3–1.2)
Total Protein: 7.4 g/dL (ref 6.5–8.1)

## 2020-08-09 LAB — CBC WITH DIFFERENTIAL/PLATELET
Abs Immature Granulocytes: 0.11 10*3/uL — ABNORMAL HIGH (ref 0.00–0.07)
Basophils Absolute: 0.1 10*3/uL (ref 0.0–0.1)
Basophils Relative: 0 %
Eosinophils Absolute: 0.5 10*3/uL (ref 0.0–0.5)
Eosinophils Relative: 2 %
HCT: 27.5 % — ABNORMAL LOW (ref 36.0–46.0)
Hemoglobin: 9.2 g/dL — ABNORMAL LOW (ref 12.0–15.0)
Immature Granulocytes: 1 %
Lymphocytes Relative: 10 %
Lymphs Abs: 2.2 10*3/uL (ref 0.7–4.0)
MCH: 30.8 pg (ref 26.0–34.0)
MCHC: 33.5 g/dL (ref 30.0–36.0)
MCV: 92 fL (ref 80.0–100.0)
Monocytes Absolute: 1.5 10*3/uL — ABNORMAL HIGH (ref 0.1–1.0)
Monocytes Relative: 7 %
Neutro Abs: 17.1 10*3/uL — ABNORMAL HIGH (ref 1.7–7.7)
Neutrophils Relative %: 80 %
Platelets: 313 10*3/uL (ref 150–400)
RBC: 2.99 MIL/uL — ABNORMAL LOW (ref 3.87–5.11)
RDW: 16.7 % — ABNORMAL HIGH (ref 11.5–15.5)
WBC: 21.5 10*3/uL — ABNORMAL HIGH (ref 4.0–10.5)
nRBC: 0 % (ref 0.0–0.2)

## 2020-08-09 LAB — APTT: aPTT: 36 seconds (ref 24–36)

## 2020-08-09 LAB — PROTIME-INR
INR: 1.6 — ABNORMAL HIGH (ref 0.8–1.2)
Prothrombin Time: 18.8 seconds — ABNORMAL HIGH (ref 11.4–15.2)

## 2020-08-09 LAB — TYPE AND SCREEN
ABO/RH(D): A POS
Antibody Screen: NEGATIVE

## 2020-08-09 LAB — ABO/RH: ABO/RH(D): A POS

## 2020-08-09 NOTE — Telephone Encounter (Signed)
Error

## 2020-08-09 NOTE — ED Triage Notes (Addendum)
Patient here from home reporting one episode of blood in sputum when coughing. Appears jaundice but states that she does not think anything is wrong with her liver but scheduled for biopsy. Denies n/v.  Denies any alcohol consumption. 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

## 2020-08-09 NOTE — ED Provider Notes (Cosign Needed)
Emergency Medicine Provider Triage Evaluation Note  [name] , a 55 y.o. female  was evaluated in triage.  Pt complains of hemoptysis.  Patient states that she woke up this afternoon and went to clear her throat and noticed traces of blood in her sputum.  Patient has a history of alcohol abuse but states that she stopped drinking alcohol about 2 months ago.  States she also stopped smoking.  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  Denies any recurrent episodes of hemoptysis.  No hematochezia or hematemesis.  Denies any chest pain, shortness of breath, or abdominal pain.  Physical Exam  BP 114/79 (BP Location: Right Arm)   Pulse 84   Temp 98 F (36.7 C) (Oral)   Resp 17   LMP 01/22/2013   SpO2 96%  Gen:   Awake, no distress   Resp:  Normal effort  MSK:   Moves extremities without difficulty  Other:  Appears jaundiced.  No signs of bleeding noted in the posterior oropharynx or intraoral space.  Protuberant abdomen is soft and nontender in all 4 quadrants.  Medical Decision Making  Medically screening exam initiated at 4:35 PM.  Appropriate orders placed.  Kella Splinter Cavallero was informed that the remainder of the evaluation will be completed by another provider, this initial triage assessment does not replace that evaluation, and the importance of remaining in the ED until their evaluation is complete.   Placido Sou, PA-C 08/09/20 1637

## 2020-08-09 NOTE — Chronic Care Management (AMB) (Signed)
  Care Management   Note  08/09/2020 Name: Kelicia Youtz MRN: [id number] DOB: 1965/02/03  Evangelyn Crouse is a 55 y.o. year old female who is a primary care patient of Champ Mungo, DO and is actively engaged with the care management team. I reached out to Neville Route Zink by phone today to assist with re-scheduling an initial visit with the RN Case Manager  Follow up plan: Unsuccessful telephone outreach attempt made. The care management team will reach out to the patient again over the next 7 days. If patient returns call to provider office, please advise to call Embedded Care Management Care Guide Gwenevere Ghazi at [phone].  Gwenevere Ghazi  Care Guide, Embedded Care Coordination [facility] Management

## 2020-08-09 NOTE — Chronic Care Management (AMB) (Signed)
  Care Management   Note  08/09/2020 Name: [name] MRN: [id number] DOB: July 15, 1965  [name] is a 55 y.o. year old female who is a primary care patient of Champ Mungo, DO and is actively engaged with the care management team. I reached out to Neville Route Holecek by phone today to assist with re-scheduling an initial visit with the RN Case Manager  Follow up plan: Telephone appointment with care management team member scheduled for:08/16/2020  [facility] Guide, Embedded Care Coordination [facility] Management

## 2020-08-09 NOTE — Telephone Encounter (Signed)
Patient called in to let PCP know she is on her way to [facility]. States she woke up and was throwing up a little bit of blood.

## 2020-08-10 ENCOUNTER — Emergency Department (HOSPITAL_COMMUNITY)
Admission: RE | Admit: 2020-08-10 | Discharge: 2020-08-10 | Disposition: A | Discharge status: Home / Self Care | Source: Ambulatory Visit | Attending: Internal Medicine | Admitting: Internal Medicine

## 2020-08-10 ENCOUNTER — Emergency Department (HOSPITAL_COMMUNITY)

## 2020-08-10 DIAGNOSIS — R188 Other ascites: Secondary | ICD-10-CM | POA: Insufficient documentation

## 2020-08-11 ENCOUNTER — Other Ambulatory Visit: Payer: Self-pay

## 2020-08-11 ENCOUNTER — Ambulatory Visit (INDEPENDENT_AMBULATORY_CARE_PROVIDER_SITE_OTHER): Admitting: Student

## 2020-08-11 ENCOUNTER — Encounter: Payer: Self-pay | Admitting: Student

## 2020-08-11 VITALS — BP 120/80 | HR 90 | Temp 98.7°F | Ht 63.0 in | Wt 165.7 lb

## 2020-08-11 DIAGNOSIS — I10 Essential (primary) hypertension: Secondary | ICD-10-CM | POA: Diagnosis not present

## 2020-08-11 DIAGNOSIS — Z87898 Personal history of other specified conditions: Secondary | ICD-10-CM | POA: Diagnosis not present

## 2020-08-11 DIAGNOSIS — E876 Hypokalemia: Secondary | ICD-10-CM | POA: Diagnosis not present

## 2020-08-11 DIAGNOSIS — K7460 Unspecified cirrhosis of liver: Secondary | ICD-10-CM

## 2020-08-11 DIAGNOSIS — D649 Anemia, unspecified: Secondary | ICD-10-CM

## 2020-08-11 DIAGNOSIS — F320 Major depressive disorder, single episode, mild: Secondary | ICD-10-CM

## 2020-08-11 DIAGNOSIS — E7409 Other glycogen storage disease: Secondary | ICD-10-CM

## 2020-08-11 DIAGNOSIS — E871 Hypo-osmolality and hyponatremia: Secondary | ICD-10-CM

## 2020-08-11 DIAGNOSIS — R188 Other ascites: Secondary | ICD-10-CM

## 2020-08-11 NOTE — Assessment & Plan Note (Addendum)
Assessment: Patient states she has been sober for the past 3 months.  States that in the past she used alcohol to cope with her stress and anxiety, this is how she coped after her husband's passing 3 years ago.  She states that she has not found a new way to cope with the stress and anxiety.  I congratulated her on this accomplishment and continue to reiterate how hard this was and how proud we are of her.  We also discussed the risks of returning back to alcohol and worsening her liver disease.  She knowledges understanding and does not want to go back to drinking alcohol.  Plan: -Continue to assess patient's alcohol use status and continue to encourage her to avoid alcohol for her stress and anxiety.

## 2020-08-11 NOTE — Assessment & Plan Note (Addendum)
Assessment: Patient states she has been struggling with depression since the loss of her husband.  She turned alcohol to deal with her depression and anxiety.  Since stopping 3 months ago she has not found any way to cope with her depression.  She states that she does not like coming in to see [place] at our clinic because it causes her increased amounts of stress and sadness.  She endorses anhedonia, lack of appetite, lack of sleep, disinterest in doing what she normally likes doing.  She denies feeling as though she has good family support and denies friends to talk with.  She states prior to drinking alcohol she was a religious person and would go to church regularly but has not since the passing of her husband.  On further chart review it appears patient has been a chronic alcohol user for the past 10+ years  She denies wanting to speak with anybody at this time but will think about telehealth visits with Dr. Sallyanne Kuster.  She denies wanting to start medications at this time to assist with her depression or anxiety.  She denies suicidal ideation however I feel as though if the patient's depression is not dealt with in the next coming weeks, especially with the new diagnosis of cirrhosis that she will fall into a deeper depression.  Especially considering her calculated mortality from her cirrhosis is 70% in 3 months.  Plan: -Follow-up visit next week and see if patient has had time to think about counseling services with Dr. Sallyanne Kuster

## 2020-08-11 NOTE — Assessment & Plan Note (Addendum)
Assessment: Patient was evaluated by my colleague last month and patient was thought to have decompensated cirrhosis.  After further chart review it appears as though patient has been dealing with alcohol use disorder for 10+ years.  She states she quit 3 months ago, Dr. Philip Aspen note however says 6 months ago.  Since patient was last seen she is endorsing episodes of gum bleeding and a nosebleed during my examination.  On my exam patient is jaundiced, with spider angiectasia's on her chest. She has palmar erythema bilaterally.  Abdominal exam is also consistent with suspected cirrhosis; palpable liver, ascites/distention.  She has a nontender abdomen. I do not suspect SBP at this time.  Laboratory examination has shown elevated AST and ALT in the past, currently with elevated PT/INR of 18.8.  Tbili 13.1, alk phos of 232, albumin of 1.7.  white count of 21, platelets of 313.  Right upper quadrant ultrasound consistent with cirrhosis with moderate ascites, patent portal flow.  Patient's history of alcohol use and her current physical exam and lab findings, all is consistent with alcoholic cirrhosis.  When she was seen in our clinic a month ago an urgent right upper quadrant ultrasound was ordered however it appears as though patient was difficulty to hold of and was lost to follow-up over the last month.  I do not believe patient has good insight into her diagnosis and believe she is dealing with severe depression.  Most of our conversation today was focused on her depression with some mentioning of her cirrhosis and the plan moving forward.  She wanted some time to process her new diagnosis and did not want to discuss the plan moving forward. We will plan for a telehealth appointment next week to discuss her poor prognosis and possible referral to GI.  Current meld score of 40, 70% 81-monthmortality.  With how far along her cirrhosis is uncertain if she would be a good transplant candidate.  Believe patient  will need outpatient palliative care and we will discuss this during our telehealth visit next week.   Plan: -Repeat CBC and CMP pending -Plan to refer patient to hepatologistas well as palliative care and chronic care management if patient is willing

## 2020-08-11 NOTE — Patient Instructions (Signed)
Thank you, [name] for allowing [place] to provide your care today. Today we discussed   Cirrhosis We will have a phone call next week to discuss this further. We did some blood work today since you will not be able to next week.   Feeling Down and Tired Please call our clinic if you need [place]. If you ever feel like harming yourself, please [phone]. It is a hotline for those feeling down and depressed.   I have ordered the following labs for you:  Lab Orders  No laboratory test(s) ordered today      Referrals ordered today:   Referral Orders  No referral(s) requested today     I have ordered the following medication/changed the following medications:   Stop the following medications: There are no discontinued medications.   Start the following medications: No orders of the defined types were placed in this encounter.    Follow up:  1 week telehealth with me     Should you have any questions or concerns please call the internal medicine clinic at [phone].     [name], D.O. [facility] Center

## 2020-08-11 NOTE — Assessment & Plan Note (Signed)
Assessment: BP of 120/80 today.  Patient currently on diuretics for her suspected cirrhosis.  We will continue her on her spironolactone 50 mg daily and Lasix 20 mg daily.  Plan: -Continue Lasix and spironolactone as per above

## 2020-08-11 NOTE — Assessment & Plan Note (Signed)
Sodium of under 130.  Suspect this is secondary to her cirrhosis and poor p.o. intake.  We will continue to monitor, repeat BMP pending

## 2020-08-12 LAB — CBC
Hematocrit: 25.8 % — ABNORMAL LOW (ref 34.0–46.6)
Hemoglobin: 9.5 g/dL — ABNORMAL LOW (ref 11.1–15.9)
MCH: 31.3 pg (ref 26.6–33.0)
MCHC: 36.8 g/dL — ABNORMAL HIGH (ref 31.5–35.7)
MCV: 85 fL (ref 79–97)
Platelets: 341 10*3/uL (ref 150–450)
RBC: 3.04 x10E6/uL — ABNORMAL LOW (ref 3.77–5.28)
RDW: 14.1 % (ref 11.7–15.4)
WBC: 19.6 10*3/uL — ABNORMAL HIGH (ref 3.4–10.8)

## 2020-08-12 LAB — BMP8+ANION GAP
Anion Gap: 12 mmol/L (ref 10.0–18.0)
BUN/Creatinine Ratio: 14 (ref 9–23)
BUN: 8 mg/dL (ref 6–24)
CO2: 22 mmol/L (ref 20–29)
Calcium: 7.9 mg/dL — ABNORMAL LOW (ref 8.7–10.2)
Chloride: 89 mmol/L — ABNORMAL LOW (ref 96–106)
Creatinine, Ser: 0.58 mg/dL (ref 0.57–1.00)
Glucose: 104 mg/dL — ABNORMAL HIGH (ref 65–99)
Potassium: 3.7 mmol/L (ref 3.5–5.2)
Sodium: 123 mmol/L — ABNORMAL LOW (ref 134–144)
eGFR: 107 mL/min/{1.73_m2} (ref >59)

## 2020-08-12 NOTE — Progress Notes (Signed)
Internal Medicine Clinic Attending  Case discussed with Dr. Evie Lacks  At the time of the visit.  We reviewed the resident's history and exam and pertinent patient test results.  I agree with the assessment, diagnosis, and plan of care documented in the resident's note.   This is a very unfortunate situation in that [name] is not quite ready to accept care; her liver disease is already advanced.

## 2020-08-15 ENCOUNTER — Ambulatory Visit (INDEPENDENT_AMBULATORY_CARE_PROVIDER_SITE_OTHER): Admitting: Student

## 2020-08-15 DIAGNOSIS — K7460 Unspecified cirrhosis of liver: Secondary | ICD-10-CM | POA: Diagnosis not present

## 2020-08-15 DIAGNOSIS — F320 Major depressive disorder, single episode, mild: Secondary | ICD-10-CM

## 2020-08-16 ENCOUNTER — Ambulatory Visit

## 2020-08-16 ENCOUNTER — Telehealth: Payer: Self-pay

## 2020-08-16 NOTE — Telephone Encounter (Signed)
  Care Management   Outreach No     08/16/2020 Name: Shaeleigh Graw MRN: [id number] DOB: 05-Jun-1965  Referred by: Champ Mungo, DO Reason for referral : No chief complaint on file.   Third unsuccessful telephone outreach was attempted today. The patient was referred to the case management team for assistance with care management and care coordination. The patient's primary care provider has been notified of our unsuccessful attempts to make or maintain contact with the patient. The care management team is pleased to engage with this patient at any time in the future should he/she be interested in assistance from the care management team.   Follow Up Plan: We have been unable to make contact with the patient for follow up. The care management team is available to follow up with the patient after provider conversation with the patient regarding recommendation for care management engagement and subsequent re-referral to the care management team.   Jodelle Gross, RN, BSN, CCM Care Management Coordinator [facility] Internal Medicine Phone: [phone] / Fax: [phone]

## 2020-08-16 NOTE — Assessment & Plan Note (Addendum)
Assessment: I discussed [name]'s recent lab findings and how severe her cirrhosis is.  Patient states that she would like everything to be done immediately and is soon as possible.  She states overall she is feeling better, she is not having trouble urinating or passing her bowels.  I discussed with her that she is severely ill and will need urgent referrals to the local hepatologist and transplant center.  Patient states that she has been sober for the past 6 months.  We will place an urgent referral for [facility] liver care and transplant in [place].  Patient was started on Lasix and spironolactone at her prior visit.  She has difficulty with transportation and so hopefully she can work with [facility] transportation to assist her to all her upcoming appointments.  She will need further blood work such as acute viral hepatitis serologies, autoimmune markers, alpha-1 antitrypsin levels.  Patient is also interested in meeting with Dr. Sallyanne Kuster of counseling services.  I believe palliative care will be of assistance at this time but moving forward it may be appropriate if she is unable to receive a transplant and is placed on hospice.  Would also like patient to return to clinic for the blood work above as well as to meet her primary care provider Dr. August Saucer  Plan: -Urgent referral to hematology -Further lab work if patient able to return to clinic -Urgent referral to Dr. Sallyanne Kuster of counseling services -Urgent referral to Dr. Sallyanne Kuster of counseling services

## 2020-08-16 NOTE — Progress Notes (Signed)
   CC: Follow-up new diagnosis of cirrhosis  This is a telephone encounter between [name] and [name] on 08/16/2020 for further discussion of her new diagnosis of cirrhosis. The visit was conducted with the patient located at home and [name] at [facility]. The patient's identity was confirmed using their DOB and current address. The patient has consented to being evaluated through a telephone encounter and understands the associated risks (an examination cannot be done and the patient may need to come in for an appointment) / benefits (allows the patient to remain at home, decreasing exposure to coronavirus). I personally spent 15 minutes on medical discussion.   HPI:  [name] is a 55 y.o. with PMH as below.   Please see A&P for assessment of the patient's acute and chronic medical conditions.   Past Medical History:  Diagnosis Date   Alcohol abuse    Alcohol dependence (HCC) 03/23/2013   Anemia    when in late teens   Anxiety    Breast abscess    Chlamydia    Depression    PTSD also   Diabetes mellitus without complication (HCC)    Foot pain, bilateral    "burning"   Gonorrhea    High cholesterol    Hypercholesteremia    Hypertension    Orthostatic hypotension 12/22/2017   Recurrent upper respiratory infection (URI)    URI/SOB-treated with antibiotics-cleared up now   Seizures (HCC)    Substance abuse (HCC)    alcohol   Tuberculosis    was treated   Review of Systems:  No acute concerns at this time   Assessment & Plan:   See Encounters Tab for problem based charting.  Patient discussed with Dr.  Mackey Birchwood Internal Medicine Resident

## 2020-08-16 NOTE — Telephone Encounter (Signed)
  Care Management   Outreach Note  08/16/2020 Name: [name] MRN: [id number] DOB: August 26, 1965  Referred by: Champ Mungo, DO Reason for referral : Chronic Care Management (Liver failure)   Third unsuccessful telephone outreach was attempted today. The patient was referred to the case management team for assistance with care management and care coordination. The patient's primary care provider has been notified of our unsuccessful attempts to make or maintain contact with the patient. The care management team is pleased to engage with this patient at any time in the future should he/she be interested in assistance from the care management team.   Follow Up Plan: We have been unable to make contact with the patient for follow up. The care management team is available to follow up with the patient after provider conversation with the patient regarding recommendation for care management engagement and subsequent re-referral to the care management team.   Jodelle Gross, RN, BSN, CCM Care Management Coordinator [facility] Internal Medicine Phone: [phone] / Fax: [phone]

## 2020-08-16 NOTE — Chronic Care Management (AMB) (Signed)
Care Management    RN Visit Note  08/16/2020 Name: [name] MRN: [id number] DOB: 12-Dec-1965  Subjective: [name] is a 55 y.o. year old female who is a primary care patient of Champ Mungo, DO. The care management team was consulted for assistance with disease management and care coordination needs.    Engaged with patient by telephone for initial visit in response to provider referral for case management and/or care coordination services.   Consent to Services:   [name] was given information about Care Management services today including:  Care Management services includes personalized support from designated clinical staff supervised by her physician, including individualized plan of care and coordination with other care providers 24/7 contact phone numbers for assistance for urgent and routine care needs. The patient may stop case management services at any time by phone call to the office staff.  Patient agreed to services and consent obtained.   Assessment: Review of patient past medical history, allergies, medications, health status, including review of consultants reports, laboratory and other test data, was performed as part of comprehensive evaluation and provision of chronic care management services.   SDOH (Social Determinants of Health) assessments and interventions performed:    Care Plan  No Known Allergies  Outpatient Encounter Medications as of 08/16/2020  Medication Sig   atorvastatin (LIPITOR) 40 MG tablet Take 1 tablet (40 mg total) by mouth daily.   calcium carbonate (OS-CAL - DOSED IN MG OF ELEMENTAL CALCIUM) 1250 (500 Ca) MG tablet Take 1 tablet by mouth daily with breakfast.   furosemide (LASIX) 20 MG tablet Take 1 tablet (20 mg total) by mouth daily.   ibuprofen (ADVIL) 200 MG tablet Take 400-600 mg by mouth every 6 (six) hours as needed for headache.   Magnesium Oxide 400 MG CAPS Take 1 capsule (400 mg total) by mouth daily.    sertraline (ZOLOFT) 50 MG tablet Take 1 tablet (50 mg total) by mouth daily.   spironolactone (ALDACTONE) 50 MG tablet Take 1 tablet (50 mg total) by mouth daily.   thiamine (VITAMIN B-1) 100 MG tablet Take 1 tablet (100 mg total) by mouth daily.   No facility-administered encounter medications on file as of 08/16/2020.    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   Substance induced mood disorder (HCC) 03/22/2013   Depression 02/05/2012   Hyperlipidemia 02/05/2012   Hypertension 01/07/2012   Type 2 diabetes mellitus with hyperlipidemia (HCC) 01/07/2012    Conditions to be addressed/monitored:  Ascites, Liver Failure  Care Plan : General Plan of Care (Adult)  Updates made by Jodelle Gross, RN since 08/16/2020 12:00 AM     Problem: Quality of Life (General Plan of Care)      Goal: Quality of Life Maintained   Start Date: 08/16/2020  This Visit's Progress: On track  Note:   Evidence-based guidance: Current Barriers: Initial encounter with patient who was reluctant to speak to this RNCM, but agreed to a short interview.  Patient has [employer] and she cannot get [facility].  Per patient, she also does not need a referral to see non-[facility] doctors.  Patient noted she was not in the [employer], it was her husband who passed fairly recently.  Patient has end stage liver failure with ascites.  She needs to go to see [facility].  Patient will also be seeing Dr.  Monna Fam for counseling.  This RNCM will be assisting with coordinating patients appointments and chronic conditions. Clinical Goal(s):  Collaboration with Champ Mungo, DO regarding development and update of comprehensive plan of care as evidenced by provider attestation and co-signature Inter-disciplinary care team collaboration (see longitudinal plan of care) patient will work with care management team to address care coordination and chronic disease management needs related to Disease Management Educational Needs Other: assisting with securing transportation to appointments.   Interventions:  Evaluation of current treatment plan related to  Liver Failure , Transportation and Mental Health Concerns  self-management and patient's adherence to plan as established by provider. Collaboration with Champ Mungo, DO regarding development and update of comprehensive plan of care as evidenced by provider attestation       and co-signature Inter-disciplinary care team collaboration (see longitudinal plan of care) Discussed plans with patient for ongoing care management follow up and provided patient with direct contact information for care management team Self Care Activities:  Patient verbalizes understanding of plan to notify this RNCM of upcoming appointments so we can secure transportation through the care guides. Patient Goals: Patient will attend all upcoming appointments  Follow Up Plan: Telephone follow up appointment with care management team member scheduled for: 2 weeks to check on upcoming appointments.  Assess patient's thoughts about quality of life, goals and expectations, and dissatisfaction or desire to improve.  Assess and monitor for signs/symptoms of psychosocial concerns, especially depression or ideations regarding harm to others or self; provide or refer for mental health services as needed.   Promote activities to decrease social isolation such as group support or social, leisure  and recreational activities, employment, use of social media; consider safety concerns about being out of home for activities.  Advocate for the development of palliative care plan that may include avoidance of unnecessary testing and intervention, symptom control, discontinuation of medications, hospice and organ donation.  Counsel as early as possible those with life-limiting chronic disease about palliative care; consider referral to palliative care provider.  Advocate for the development of palliative care plan.   Notes:      Plan: Telephone follow up appointment with care management team member scheduled for:  2 weeks  Jodelle Gross, RN, BSN, CCM Care Management Coordinator [facility] Phone: [phone] / Fax: [phone]

## 2020-08-16 NOTE — Patient Instructions (Signed)
Visit Information   Goals Addressed             This Visit's Progress    Make and Keep All Appointments       Timeframe:  Long-Range Goal Priority:  High Start Date:  08/16/2020                           Expected End Date:                       Follow Up Date 09/04/2020   - arrange a ride through an agency 1 week before appointment - ask family or friend for a ride - call to cancel if needed - keep a calendar with appointment dates    Why is this important?   Part of staying healthy is seeing the doctor for follow-up care.  If you forget your appointments, there are some things you can do to stay on track.    Notes:      Matintain My Quality of Life       Timeframe:  Long-Range Goal Priority:  High Start Date:                             Expected End Date:                       Follow Up Date 09/04/2020    - discuss my treatment options with the doctor or nurse - do one enjoyable thing every day - do something different, like talking to a new person or going to a new place, every day - make shared treatment decisions with doctor - name a health care proxy (decision maker)    Why is this important?   Having a long-term illness can be scary.  It can also be stressful for you and your caregiver.  These steps may help.    Notes:         The patient verbalized understanding of instructions, educational materials, and care plan provided today and declined offer to receive copy of patient instructions, educational materials, and care plan.   Telephone follow up appointment with care management team member scheduled for: 08/31/20@1 :30  Jodelle Gross, RN, BSN, CCM Care Management Coordinator [facility] Internal Medicine Phone: [phone] / Fax: [phone]

## 2020-08-20 NOTE — Progress Notes (Signed)
Internal Medicine Clinic Attending  Case discussed with Dr. Evie Lacks  At the time of the visit.  We reviewed the resident's history and exam and pertinent patient test results.  I agree with the assessment, diagnosis, and plan of care documented in the resident's note.  [name] is facing the reality of her previously unacknowledged (I.e. unawareness, denial) severe liver disease.  [name]. Is taking appropriate steps to help ensure she has emotional support and is quickly established with specialty care.

## 2020-08-24 ENCOUNTER — Encounter: Payer: Self-pay | Admitting: Internal Medicine

## 2020-08-30 ENCOUNTER — Telehealth: Payer: Self-pay | Admitting: Student

## 2020-08-30 NOTE — Telephone Encounter (Signed)
Attempted to call [name] x2.  Per our referral team, [facility] team has been attempting to contact the patient but have been unable to get a hold of her.  I tried calling her it immediately went to voicemail however her voice mailbox was full.  We will continue to attempt to reach out to the patient so that she is able to schedule this emergent referral.

## 2020-08-31 ENCOUNTER — Telehealth

## 2020-08-31 ENCOUNTER — Telehealth: Payer: Self-pay

## 2020-08-31 NOTE — Telephone Encounter (Signed)
  Care Management   Outreach Note  08/31/2020 Name: [name] MRN: [id number] DOB: 07-18-1965  Referred by: Champ Mungo, DO Reason for referral : No chief complaint on file.   Third unsuccessful telephone outreach was attempted today. The patient was referred to the case management team for assistance with care management and care coordination. The patient's primary care provider has been notified of our unsuccessful attempts to make or maintain contact with the patient. The care management team is pleased to engage with this patient at any time in the future should he/she be interested in assistance from the care management team.   Follow Up Plan: We have been unable to make contact with the patient for follow up. The care management team is available to follow up with the patient after provider conversation with the patient regarding recommendation for care management engagement and subsequent re-referral to the care management team.   Jodelle Gross, RN, BSN, CCM Care Management Coordinator [facility] Internal Medicine Phone: [phone] / Fax: [phone]

## 2020-09-11 ENCOUNTER — Institutional Professional Consult (permissible substitution): Admitting: Behavioral Health

## 2020-09-21 DEATH — deceased

## 2021-11-27 IMAGING — DX DG CHEST 1V PORT
1 series · 1 of 1 positions shown · non-contrast
Comparison: Radiograph and chest CT 05/22/2018

CLINICAL DATA: Hemoptysis.

EXAM:
PORTABLE CHEST 1 VIEW

[chest ap]
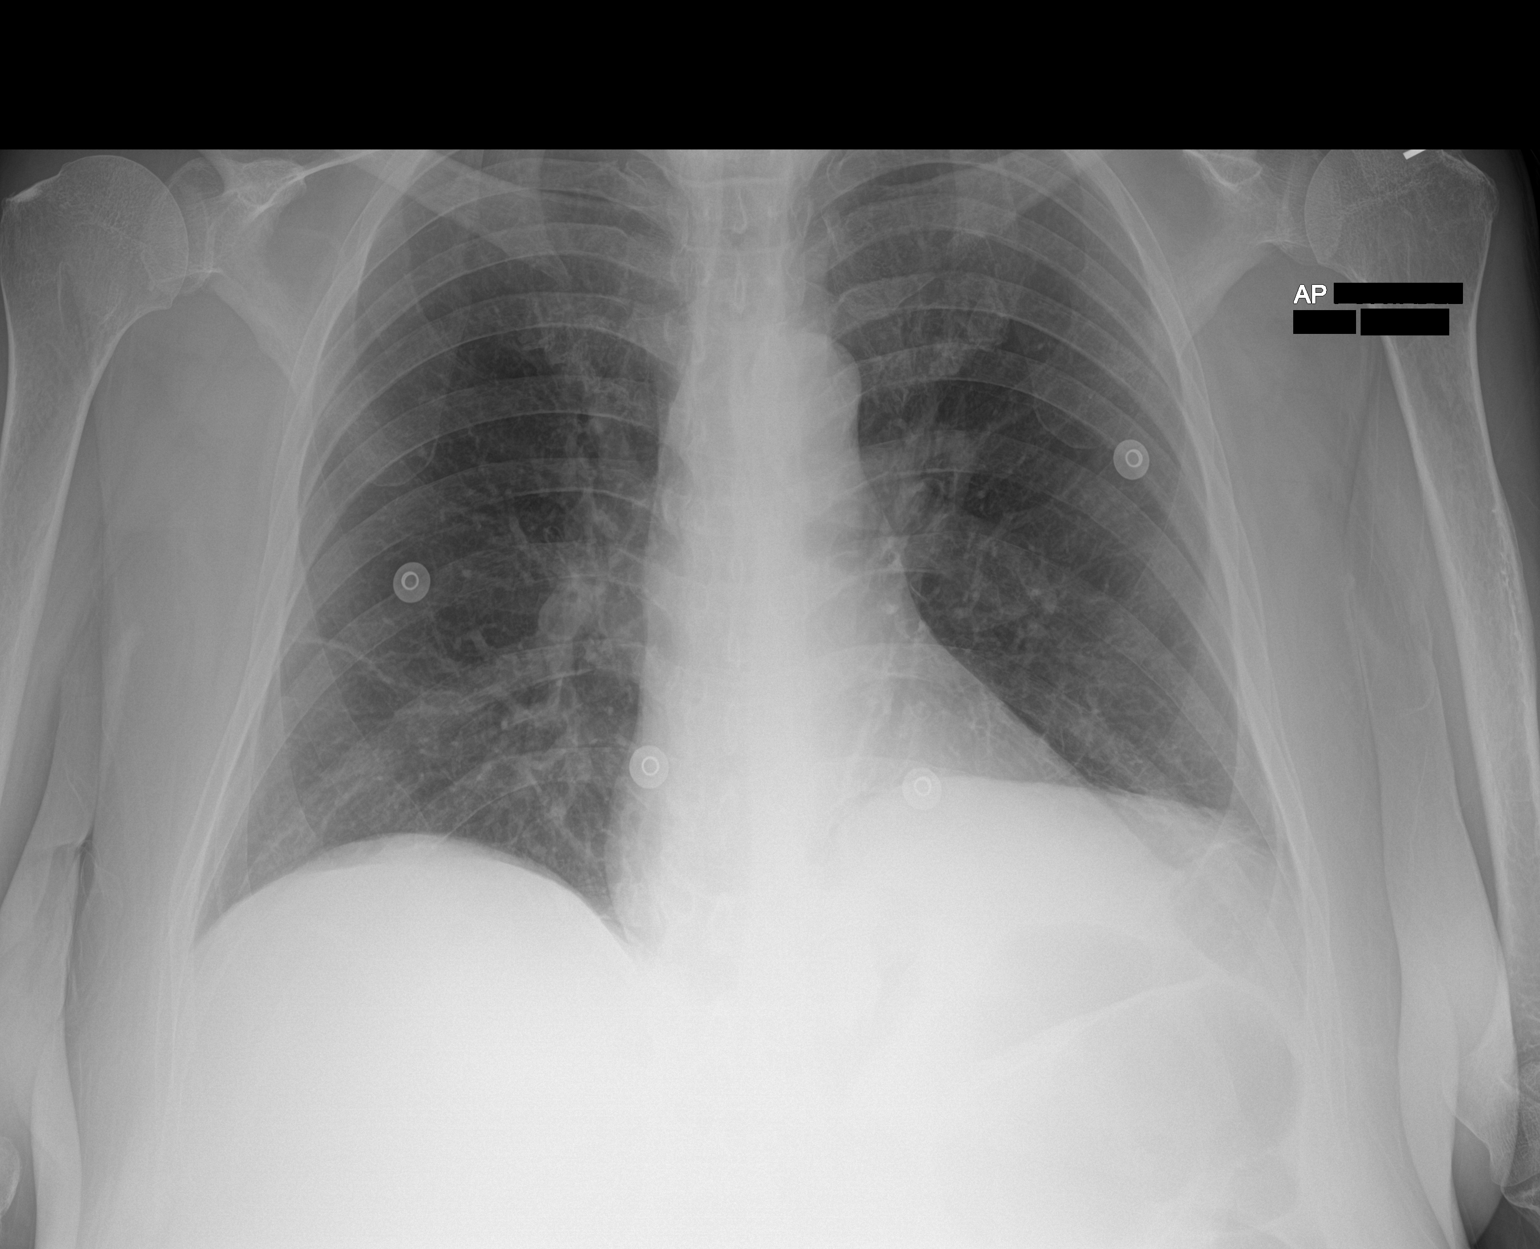

[1 of 1 positions shown; findings below may reference images not displayed]

FINDINGS: Elevated left hemidiaphragm with adjacent linear opacities.
Subsegmental atelectasis at the right lung base. Normal heart size
is with unchanged mediastinal contours. No evidence of
pneumomediastinum. No pneumothorax or pulmonary edema. No acute
osseous abnormalities are seen.
IMPRESSION: 1. Elevated left hemidiaphragm with adjacent linear opacities,
likely atelectasis.
2. Subsegmental atelectasis at the right lung base.
